# Patient Record
Sex: Female | Born: 1965
Health system: Southern US, Community
[De-identification: ages and names within clinical notes are randomized; demographics above are authoritative.]

## PROBLEM LIST (undated history)

## (undated) DIAGNOSIS — G4730 Sleep apnea, unspecified: Secondary | ICD-10-CM

## (undated) DIAGNOSIS — F32A Depression, unspecified: Secondary | ICD-10-CM

## (undated) DIAGNOSIS — J189 Pneumonia, unspecified organism: Secondary | ICD-10-CM

## (undated) DIAGNOSIS — T8859XA Other complications of anesthesia, initial encounter: Secondary | ICD-10-CM

## (undated) DIAGNOSIS — O2230 Deep phlebothrombosis in pregnancy, unspecified trimester: Secondary | ICD-10-CM

## (undated) DIAGNOSIS — I10 Essential (primary) hypertension: Secondary | ICD-10-CM

## (undated) DIAGNOSIS — R112 Nausea with vomiting, unspecified: Secondary | ICD-10-CM

## (undated) DIAGNOSIS — Z8719 Personal history of other diseases of the digestive system: Secondary | ICD-10-CM

## (undated) DIAGNOSIS — T7840XA Allergy, unspecified, initial encounter: Secondary | ICD-10-CM

## (undated) DIAGNOSIS — K219 Gastro-esophageal reflux disease without esophagitis: Secondary | ICD-10-CM

## (undated) DIAGNOSIS — F329 Major depressive disorder, single episode, unspecified: Secondary | ICD-10-CM

## (undated) DIAGNOSIS — F419 Anxiety disorder, unspecified: Secondary | ICD-10-CM

## (undated) DIAGNOSIS — E039 Hypothyroidism, unspecified: Secondary | ICD-10-CM

## (undated) DIAGNOSIS — D649 Anemia, unspecified: Secondary | ICD-10-CM

## (undated) DIAGNOSIS — R519 Headache, unspecified: Secondary | ICD-10-CM

## (undated) DIAGNOSIS — Z9889 Other specified postprocedural states: Secondary | ICD-10-CM

## (undated) DIAGNOSIS — T4145XA Adverse effect of unspecified anesthetic, initial encounter: Secondary | ICD-10-CM

## (undated) DIAGNOSIS — M1990 Unspecified osteoarthritis, unspecified site: Secondary | ICD-10-CM

## (undated) DIAGNOSIS — I2699 Other pulmonary embolism without acute cor pulmonale: Secondary | ICD-10-CM

## (undated) HISTORY — DX: Allergy, unspecified, initial encounter: T78.40XA

## (undated) HISTORY — PX: TOTAL ABDOMINAL HYSTERECTOMY W/ BILATERAL SALPINGOOPHORECTOMY: SHX83

## (undated) HISTORY — PX: OTHER SURGICAL HISTORY: SHX169

## (undated) HISTORY — PX: ABDOMINAL HYSTERECTOMY: SHX81

## (undated) HISTORY — PX: WISDOM TOOTH EXTRACTION: SHX21

## (undated) HISTORY — PX: COLONOSCOPY: SHX174

## (undated) HISTORY — PX: TONSILLECTOMY: SUR1361

---
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
## 2006-09-16 ENCOUNTER — Ambulatory Visit: Payer: Self-pay | Admitting: Family Medicine

## 2006-10-12 ENCOUNTER — Ambulatory Visit: Payer: Self-pay | Admitting: Gastroenterology

## 2011-06-23 DIAGNOSIS — M5136 Other intervertebral disc degeneration, lumbar region: Secondary | ICD-10-CM | POA: Insufficient documentation

## 2011-06-23 DIAGNOSIS — M47816 Spondylosis without myelopathy or radiculopathy, lumbar region: Secondary | ICD-10-CM | POA: Insufficient documentation

## 2011-07-01 DIAGNOSIS — D6869 Other thrombophilia: Secondary | ICD-10-CM | POA: Insufficient documentation

## 2011-07-01 DIAGNOSIS — F329 Major depressive disorder, single episode, unspecified: Secondary | ICD-10-CM | POA: Insufficient documentation

## 2011-07-01 DIAGNOSIS — G4700 Insomnia, unspecified: Secondary | ICD-10-CM | POA: Insufficient documentation

## 2011-07-01 DIAGNOSIS — K5790 Diverticulosis of intestine, part unspecified, without perforation or abscess without bleeding: Secondary | ICD-10-CM | POA: Insufficient documentation

## 2011-07-01 DIAGNOSIS — D699 Hemorrhagic condition, unspecified: Secondary | ICD-10-CM | POA: Insufficient documentation

## 2011-07-01 DIAGNOSIS — D509 Iron deficiency anemia, unspecified: Secondary | ICD-10-CM | POA: Insufficient documentation

## 2011-08-21 DIAGNOSIS — G894 Chronic pain syndrome: Secondary | ICD-10-CM | POA: Insufficient documentation

## 2011-08-21 DIAGNOSIS — G8929 Other chronic pain: Secondary | ICD-10-CM | POA: Insufficient documentation

## 2012-08-03 DIAGNOSIS — Z803 Family history of malignant neoplasm of breast: Secondary | ICD-10-CM | POA: Insufficient documentation

## 2012-08-17 DIAGNOSIS — I8290 Acute embolism and thrombosis of unspecified vein: Secondary | ICD-10-CM | POA: Insufficient documentation

## 2012-08-17 DIAGNOSIS — Z86718 Personal history of other venous thrombosis and embolism: Secondary | ICD-10-CM | POA: Insufficient documentation

## 2013-02-01 DIAGNOSIS — M7750 Other enthesopathy of unspecified foot: Secondary | ICD-10-CM | POA: Insufficient documentation

## 2013-02-01 DIAGNOSIS — M722 Plantar fascial fibromatosis: Secondary | ICD-10-CM | POA: Insufficient documentation

## 2013-02-01 DIAGNOSIS — M79673 Pain in unspecified foot: Secondary | ICD-10-CM | POA: Insufficient documentation

## 2013-06-29 DIAGNOSIS — M5416 Radiculopathy, lumbar region: Secondary | ICD-10-CM | POA: Insufficient documentation

## 2013-10-25 DIAGNOSIS — K649 Unspecified hemorrhoids: Secondary | ICD-10-CM | POA: Insufficient documentation

## 2013-10-25 DIAGNOSIS — K635 Polyp of colon: Secondary | ICD-10-CM | POA: Insufficient documentation

## 2013-11-18 DIAGNOSIS — G8929 Other chronic pain: Secondary | ICD-10-CM | POA: Insufficient documentation

## 2015-04-04 DIAGNOSIS — F4011 Social phobia, generalized: Secondary | ICD-10-CM | POA: Insufficient documentation

## 2015-04-04 DIAGNOSIS — Z62819 Personal history of unspecified abuse in childhood: Secondary | ICD-10-CM | POA: Insufficient documentation

## 2016-01-01 DIAGNOSIS — G43019 Migraine without aura, intractable, without status migrainosus: Secondary | ICD-10-CM | POA: Insufficient documentation

## 2016-07-22 DIAGNOSIS — H8110 Benign paroxysmal vertigo, unspecified ear: Secondary | ICD-10-CM | POA: Insufficient documentation

## 2019-02-02 ENCOUNTER — Encounter: Payer: Self-pay | Admitting: Emergency Medicine

## 2019-02-02 ENCOUNTER — Other Ambulatory Visit: Payer: Self-pay

## 2019-02-02 ENCOUNTER — Inpatient Hospital Stay
Admission: EM | Admit: 2019-02-02 | Discharge: 2019-02-03 | DRG: 176 | Disposition: A | Discharge status: Home / Self Care | Payer: Self-pay | Attending: Internal Medicine | Admitting: Internal Medicine

## 2019-02-02 ENCOUNTER — Emergency Department: Payer: Self-pay

## 2019-02-02 ENCOUNTER — Other Ambulatory Visit
Admission: RE | Admit: 2019-02-02 | Discharge: 2019-02-02 | Disposition: A | Discharge status: Home / Self Care | Payer: Self-pay | Source: Ambulatory Visit | Attending: Internal Medicine | Admitting: Internal Medicine

## 2019-02-02 DIAGNOSIS — Z86718 Personal history of other venous thrombosis and embolism: Secondary | ICD-10-CM

## 2019-02-02 DIAGNOSIS — M549 Dorsalgia, unspecified: Secondary | ICD-10-CM | POA: Diagnosis present

## 2019-02-02 DIAGNOSIS — Z885 Allergy status to narcotic agent status: Secondary | ICD-10-CM

## 2019-02-02 DIAGNOSIS — R0602 Shortness of breath: Secondary | ICD-10-CM | POA: Insufficient documentation

## 2019-02-02 DIAGNOSIS — F329 Major depressive disorder, single episode, unspecified: Secondary | ICD-10-CM | POA: Diagnosis present

## 2019-02-02 DIAGNOSIS — G8929 Other chronic pain: Secondary | ICD-10-CM | POA: Diagnosis present

## 2019-02-02 DIAGNOSIS — M7989 Other specified soft tissue disorders: Secondary | ICD-10-CM | POA: Diagnosis present

## 2019-02-02 DIAGNOSIS — Z79899 Other long term (current) drug therapy: Secondary | ICD-10-CM

## 2019-02-02 DIAGNOSIS — E876 Hypokalemia: Secondary | ICD-10-CM | POA: Diagnosis present

## 2019-02-02 DIAGNOSIS — Z79891 Long term (current) use of opiate analgesic: Secondary | ICD-10-CM

## 2019-02-02 DIAGNOSIS — Z66 Do not resuscitate: Secondary | ICD-10-CM | POA: Diagnosis present

## 2019-02-02 DIAGNOSIS — Z888 Allergy status to other drugs, medicaments and biological substances status: Secondary | ICD-10-CM

## 2019-02-02 DIAGNOSIS — R7989 Other specified abnormal findings of blood chemistry: Secondary | ICD-10-CM

## 2019-02-02 DIAGNOSIS — I2699 Other pulmonary embolism without acute cor pulmonale: Principal | ICD-10-CM | POA: Diagnosis present

## 2019-02-02 DIAGNOSIS — Z86711 Personal history of pulmonary embolism: Secondary | ICD-10-CM | POA: Diagnosis present

## 2019-02-02 DIAGNOSIS — Z20828 Contact with and (suspected) exposure to other viral communicable diseases: Secondary | ICD-10-CM | POA: Diagnosis present

## 2019-02-02 DIAGNOSIS — Z882 Allergy status to sulfonamides status: Secondary | ICD-10-CM

## 2019-02-02 DIAGNOSIS — Z87891 Personal history of nicotine dependence: Secondary | ICD-10-CM

## 2019-02-02 HISTORY — DX: Deep phlebothrombosis in pregnancy, unspecified trimester: O22.30

## 2019-02-02 LAB — CBC WITH DIFFERENTIAL/PLATELET
Abs Immature Granulocytes: 0.02 10*3/uL (ref 0.00–0.07)
Basophils Absolute: 0 10*3/uL (ref 0.0–0.1)
Basophils Relative: 0 %
Eosinophils Absolute: 0.2 10*3/uL (ref 0.0–0.5)
Eosinophils Relative: 2 %
HCT: 43 % (ref 36.0–46.0)
Hemoglobin: 14.7 g/dL (ref 12.0–15.0)
Immature Granulocytes: 0 %
Lymphocytes Relative: 19 %
Lymphs Abs: 1.3 10*3/uL (ref 0.7–4.0)
MCH: 27.8 pg (ref 26.0–34.0)
MCHC: 34.2 g/dL (ref 30.0–36.0)
MCV: 81.4 fL (ref 80.0–100.0)
Monocytes Absolute: 0.3 10*3/uL (ref 0.1–1.0)
Monocytes Relative: 4 %
Neutro Abs: 5 10*3/uL (ref 1.7–7.7)
Neutrophils Relative %: 75 %
Platelets: 321 10*3/uL (ref 150–400)
RBC: 5.28 MIL/uL — ABNORMAL HIGH (ref 3.87–5.11)
RDW: 12.4 % (ref 11.5–15.5)
WBC: 6.8 10*3/uL (ref 4.0–10.5)
nRBC: 0 % (ref 0.0–0.2)

## 2019-02-02 LAB — BASIC METABOLIC PANEL
Anion gap: 11 (ref 5–15)
BUN: 15 mg/dL (ref 6–20)
CO2: 26 mmol/L (ref 22–32)
Calcium: 10.1 mg/dL (ref 8.9–10.3)
Chloride: 104 mmol/L (ref 98–111)
Creatinine, Ser: 0.89 mg/dL (ref 0.44–1.00)
GFR calc Af Amer: >60 mL/min (ref >60)
GFR calc non Af Amer: >60 mL/min (ref >60)
Glucose, Bld: 103 mg/dL — ABNORMAL HIGH (ref 70–99)
Potassium: 3.3 mmol/L — ABNORMAL LOW (ref 3.5–5.1)
Sodium: 141 mmol/L (ref 135–145)

## 2019-02-02 LAB — TYPE AND SCREEN
ABO/RH(D): B POS
Antibody Screen: NEGATIVE

## 2019-02-02 LAB — PROTIME-INR
INR: 1 (ref 0.8–1.2)
Prothrombin Time: 13.5 seconds (ref 11.4–15.2)

## 2019-02-02 LAB — FIBRIN DERIVATIVES D-DIMER (ARMC ONLY): Fibrin derivatives D-dimer (ARMC): 2836.33 ng/mL (FEU) — ABNORMAL HIGH (ref 0.00–499.00)

## 2019-02-02 LAB — APTT: aPTT: 28 seconds (ref 24–36)

## 2019-02-02 LAB — TROPONIN I (HIGH SENSITIVITY): Troponin I (High Sensitivity): 3 ng/L (ref <18)

## 2019-02-02 LAB — HEPARIN LEVEL (UNFRACTIONATED): Heparin Unfractionated: 0.4 IU/mL (ref 0.30–0.70)

## 2019-02-02 MED ORDER — HEPARIN (PORCINE) 25000 UT/250ML-% IV SOLN
1100.0000 [IU]/h | INTRAVENOUS | Status: AC
  Administered 2019-02-02: 17:00:00 1100 [IU]/h via INTRAVENOUS
  Filled 2019-02-02: qty 250

## 2019-02-02 MED ORDER — TIZANIDINE HCL 4 MG PO TABS
4.0000 mg | ORAL_TABLET | Freq: Three times a day (TID) | ORAL | Status: DC | PRN
  Filled 2019-02-02: qty 1

## 2019-02-02 MED ORDER — POTASSIUM CHLORIDE CRYS ER 20 MEQ PO TBCR
40.0000 meq | EXTENDED_RELEASE_TABLET | Freq: Once | ORAL | Status: AC
  Administered 2019-02-02: 40 meq via ORAL
  Filled 2019-02-02: qty 2

## 2019-02-02 MED ORDER — OXYCODONE HCL 5 MG PO TABS
30.0000 mg | ORAL_TABLET | Freq: Every day | ORAL | Status: DC
  Administered 2019-02-02 – 2019-02-03 (×5): 30 mg via ORAL
  Filled 2019-02-02 (×5): qty 6

## 2019-02-02 MED ORDER — MIRTAZAPINE 15 MG PO TABS
15.0000 mg | ORAL_TABLET | Freq: Every day | ORAL | Status: DC
  Administered 2019-02-02: 15 mg via ORAL

## 2019-02-02 MED ORDER — SODIUM CHLORIDE 0.9 % IV SOLN
INTRAVENOUS | Status: DC
  Administered 2019-02-02: 19:00:00 via INTRAVENOUS

## 2019-02-02 MED ORDER — FLUTICASONE PROPIONATE 50 MCG/ACT NA SUSP
1.0000 | Freq: Every day | NASAL | Status: DC
  Filled 2019-02-02: qty 16

## 2019-02-02 MED ORDER — HEPARIN BOLUS VIA INFUSION
4000.0000 [IU] | Freq: Once | INTRAVENOUS | Status: AC
  Administered 2019-02-02: 17:00:00 4000 [IU] via INTRAVENOUS

## 2019-02-02 MED ORDER — LABETALOL HCL 5 MG/ML IV SOLN
10.0000 mg | INTRAVENOUS | Status: DC | PRN
  Administered 2019-02-02 – 2019-02-03 (×2): 10 mg via INTRAVENOUS
  Filled 2019-02-02: qty 4

## 2019-02-02 MED ORDER — IOHEXOL 350 MG/ML SOLN
75.0000 mL | Freq: Once | INTRAVENOUS | Status: AC | PRN
  Administered 2019-02-02: 75 mL via INTRAVENOUS

## 2019-02-02 MED ORDER — DIPHENHYDRAMINE HCL 25 MG PO CAPS: 75.0000 mg | ORAL_CAPSULE | Freq: Every evening | ORAL | Status: DC | PRN

## 2019-02-02 MED ORDER — VENLAFAXINE HCL ER 150 MG PO CP24
300.0000 mg | ORAL_CAPSULE | Freq: Every day | ORAL | Status: DC
  Administered 2019-02-03: 300 mg via ORAL
  Filled 2019-02-02: qty 2
  Filled 2019-02-02: qty 4

## 2019-02-02 MED ORDER — ZOLPIDEM TARTRATE 5 MG PO TABS
5.0000 mg | ORAL_TABLET | Freq: Every evening | ORAL | Status: DC | PRN
  Administered 2019-02-02: 5 mg via ORAL
  Filled 2019-02-02: qty 1

## 2019-02-02 MED ORDER — LABETALOL HCL 5 MG/ML IV SOLN
INTRAVENOUS | Status: AC
  Filled 2019-02-02: qty 4

## 2019-02-02 MED ORDER — SODIUM CHLORIDE 0.9 % IV BOLUS
1000.0000 mL | Freq: Once | INTRAVENOUS | Status: AC
  Administered 2019-02-02: 1000 mL via INTRAVENOUS

## 2019-02-02 MED ORDER — HEPARIN (PORCINE) 25000 UT/250ML-% IV SOLN
INTRAVENOUS | Status: AC
  Administered 2019-02-02: 1100 [IU]/h via INTRAVENOUS
  Filled 2019-02-02: qty 250

## 2019-02-02 NOTE — ED Notes (Signed)
Patient transported to Ultrasound 

## 2019-02-02 NOTE — ED Provider Notes (Addendum)
[facility] Emergency Department Provider Note  ____________________________________________   First MD Initiated Contact with Patient 02/02/19 1406     (approximate)  I have reviewed the triage vital signs and the nursing notes.  History  Chief Complaint Shortness of Breath    HPI Tracey Perez is a 53 y.o. female with a history of anemia, DVT who presents from clinic for left leg swelling and shortness of breath.  Patient was initially seen in the clinic, and had labs performed which revealed an elevated d-dimer, was therefore referred to the emergency department for further evaluation.  Patient reports ongoing shortness of breath for approximately 1 week.  Her symptoms seem to have started without any inciting event.  She reports shortness of breath is worsened with exertion.  She has some associated chest pain on deep inspiration.  Aside from this, she does not have any other chest pain.  No fevers or cough.  She also reports ongoing, intermittent issues with left lower extremity swelling.  This has been going on for 1 to 2 months.  She has been wearing compression stockings for this and states it has improved.  No recent travel.  No hormonal medication use.  On arrival to the emergency department she states she is not experiencing any of the swelling currently.  She reports a remote history of DVT in the setting of OCP use.  After treatment of such she has not been on any anticoagulation.   Past Medical Hx DVT  Problem List History of DVT   Past Surgical Hx Hysterectomy  Medications Prior to Admission medications   Not on File    Allergies Morphine and related, Sulfa antibiotics, and Acetazolamide  Family Hx No family history on file.  Social Hx Social History   Tobacco Use  . Smoking status: Not on file  Substance Use Topics  . Alcohol use: Not on file  . Drug use: Not on file     Review of Systems  Constitutional:  Negative for fever, chills. Eyes: Negative for visual changes. ENT: Negative for sore throat. Cardiovascular: Negative for chest pain. Respiratory: + for shortness of breath. Gastrointestinal: Negative for nausea, vomiting.  Genitourinary: Negative for dysuria. Musculoskeletal: + for leg swelling. Skin: Negative for rash. Neurological: Negative for for headaches.   Physical Exam  Vital Signs: ED Triage Vitals  Enc Vitals Group     BP 02/02/19 1357 (!) 189/98     Pulse Rate 02/02/19 1357 94     Resp 02/02/19 1357 20     Temp 02/02/19 1357 98.4 F (36.9 C)     Temp Source 02/02/19 1357 Oral     SpO2 02/02/19 1357 97 %     Weight 02/02/19 1402 166 lb (75.3 kg)     Height 02/02/19 1402 5\' 3"  (1.6 m)     Head Circumference --      Peak Flow --      Pain Score 02/02/19 1402 4     Pain Loc --      Pain Edu? --      Excl. in [place]? --     Constitutional: Alert and oriented.  Head: Normocephalic. Atraumatic. Eyes: Conjunctivae clear. Sclera anicteric. Nose: No congestion. No rhinorrhea. Mouth/Throat: Mucous membranes are moist.  Neck: No stridor.   Cardiovascular: Normal rate, regular rhythm. Extremities well perfused. Respiratory: Normal respiratory effort.  Lungs CTAB.  Speaking in full sentences.  Oxygen 97% on room air. Gastrointestinal: Soft. Non-tender. Non-distended.  Musculoskeletal: No lower  extremity edema.  No lower extremity swelling or asymmetry.  No calf tenderness.  No deformities. Neurologic:  Normal speech and language. No gross focal neurologic deficits are appreciated.  Bilateral lower extremity strength 5/5 and symmetric. Skin: Skin is warm, dry and intact. No rash noted. Psychiatric: Mood and affect are appropriate for situation.  EKG  Personally reviewed.   Rate: 94 Rhythm: sinus Axis: normal Intervals: WNL No acute ischemic changes S1Q3T3 No STEMI    Radiology  [place]: IMPRESSION: Negative for deep venous thrombosis in left lower extremity.   CT: IMPRESSION:  1. Positive for bilateral pulmonary emboli. Clot burden is moderate  in size. No evidence for right heart strain.  2. Poorly defined pleural-based densities in the right middle lobe  probably represent small infarcts. Additional small areas of  ground-glass attenuation could represent infarct or atelectasis.  3. 4 mm pleural-based nodule in the right upper lobe. No follow-up  needed if patient is low-risk. Non-contrast chest CT can be  considered in 12 months if patient is high-risk. This recommendation  follows the consensus statement: Guidelines for Management of  Incidental Pulmonary Nodules Detected on CT Images: From the  Fleischner Society 2017; Radiology 2017; 284:228-243.  4. Hiatal hernia.    Procedures  Procedure(s) performed (including critical care):  .Critical Care Performed by: Lilia Pro., MD Authorized by: Lilia Pro., MD   Critical care provider statement:    Critical care time (minutes):  30   Critical care was necessary to treat or prevent imminent or life-threatening deterioration of the following conditions: PE treated with heparin gtt.   Critical care was time spent personally by me on the following activities:  Discussions with consultants, evaluation of patient's response to treatment, examination of patient, ordering and performing treatments and interventions, ordering and review of laboratory studies, ordering and review of radiographic studies, pulse oximetry, re-evaluation of patient's condition, obtaining history from patient or surrogate and review of old charts     Initial Impression / Assessment and Plan / ED Course  53 y.o. female who presents to the ED for 1 week of shortness of breath, and intermittent left lower extremity swelling x 1 month, none currently.  History of a DVT many years ago, not currently on any anticoagulation.  Seen at clinic with an elevated d-dimer and referred to the emergency department.  Ddx:  DVT, PE, positional swelling, amongst others  Plan: labs, CT, [place]  Ultrasound negative for DVT.  CT scan is positive for bilateral pulmonary emboli, moderate clot burden and concern for small areas of related infarct.  As such, will initiate a heparin drip and plan for admission.  She is hemodynamically stable, not in any respiratory distress and not requiring any supplemental oxygen.  Her troponin is negative.  Discussed with hospitalist for admission.   Final Clinical Impression(s) / ED Diagnosis  Final diagnoses:  Elevated d-dimer  Shortness of breath  Pulmonary embolism     Note:  This document was prepared using Dragon voice recognition software and may include unintentional dictation errors.     Lilia Pro., MD 02/02/19 2020

## 2019-02-02 NOTE — ED Notes (Signed)
Spoke with floor. Receiving RN states there is no bed in the room and they are waiting on bed and will call when it arrives.

## 2019-02-02 NOTE — ED Notes (Signed)
Pt states she is allergic to plastic that IV bags are made of. States she can still receive IVF, but may have itching that is relieved by Benadryl. EDP notified, ok to run IVF at this time.

## 2019-02-02 NOTE — ED Triage Notes (Signed)
Pt here with c/o shob that began last week, nothing seemed to make it better or worse, just consistently feeling like she can't catch her breath. Swelling in her left lower extremity for about a month now, hx of dvt in 2002. D-dimer today in office resulted at 2,836.33. Able to talk in complete sentences, no distress at this time. Texting and laughing in triage.

## 2019-02-02 NOTE — Progress Notes (Signed)
   02/02/19 2000  Clinical Encounter Type  Visited With Patient and family together  Visit Type Initial  Referral From Nurse  Ch went in for AD education. Pt already had an electronic copy of the notarized version from 2018. Ch requested the daughter to bring a printed copy so that it can be placed on file.

## 2019-02-02 NOTE — Consult Note (Signed)
ANTICOAGULATION CONSULT NOTE - Follow Up Consult  Pharmacy Consult for Heparin Indication: pulmonary embolus  Allergies  Allergen Reactions  . Morphine And Related Hives  . Nsaids Other (See Comments)    Affects platelet count  . Sulfa Antibiotics Shortness Of Breath  . Tape Other (See Comments)    bruises skin  . Verapamil Swelling  . Other Itching    Pt states she is allergic to the plastic that IV bags are made of. Reaction causes itching relieved by Benadryl.   . Acetazolamide Other (See Comments)    Acid reflux   . Seroquel [Quetiapine Fumarate] Other (See Comments)    Muscle cramps    Patient Measurements: Height: 5\' 3"  (160 cm) Weight: 166 lb (75.3 kg) IBW/kg (Calculated) : 52.4 Heparin Dosing Weight: 68.4  Vital Signs: Temp: 98.4 F (36.9 C) (10/14 1357) Temp Source: Oral (10/14 1357) BP: 157/94 (10/14 1508) Pulse Rate: 104 (10/14 1511)  Labs: Recent Labs    02/02/19 1417  HGB 14.7  HCT 43.0  PLT 321  CREATININE 0.89  TROPONINIHS 3    Estimated Creatinine Clearance: 70.3 mL/min (by C-G formula based on SCr of 0.89 mg/dL).   Medications:  No anticoagulation per medication reconciliation  Assessment: 53 y/o F with history of anemia and DVT presents from clinic for left leg swelling and SOB. Labs significant for elevated D-dimer. CTA significant for bilateral PE with moderate clot burden and no evidence of right heart strain. Pharmacy consulted to start heparin.   Goal of Therapy:  Heparin level 0.3-0.7 units/ml Monitor platelets by anticoagulation protocol: Yes   Plan:  -Heparin 4000 unit bolus IV x 1 followed by a continuous infusion at 1100 units/hr -Heparin level at 2300 -Daily heparin level and CBC per protocol  [place] Resident 02/02/2019,3:53 PM

## 2019-02-02 NOTE — H&P (Signed)
[place] [facility] [place] NAME: Tracey Perez    MR#:  [id number]  DATE OF BIRTH:  Jun 27, 1965  DATE OF ADMISSION:  02/02/2019  PRIMARY CARE PHYSICIAN: Patient, No Pcp Per   REQUESTING/REFERRING PHYSICIAN: Derrell Lolling  CHIEF COMPLAINT:   Chief Complaint  Patient presents with   Shortness of Breath    HISTORY OF PRESENT ILLNESS:  Tracey Perez  is a 53 y.o. female with a known history of DVT in the setting of being on OCP, previously treated with anticoagulation with Coumadin for about 1 year status post, migraine headaches, depression and chronic back pain on narcotics who presented to the emergency room from the clinic with complaints of left leg swelling and some shortness of breath.  Also reported some chest pain which was pleuritic in nature.  No fevers.  No nausea vomiting.  Patient has had chronic left lower extremity swelling since prior DVT and uses compression stockings because she has to stand for about 10 hours a day while walking.  Patient was evaluated in the emergency room.  Troponin negative at 3.  Left lower extremity venous Doppler ultrasound negative for DVT.  CT angiogram of the chest done was positive for bilateral pulmonary emboli. Clot burden is moderate in size. No evidence for right heart strain. Poorly defined pleural-based densities in the right middle lobe probably represent small infarcts. Additional small areas of ground-glass attenuation could represent infarct or atelectasis. 3. 4 mm pleural-based nodule in the right upper lobe.  Emergency room provider requested for pharmacist to assist with initiation of heparin drip for treatment of bilateral PE.  Oxygen saturation of 97% on room air.  Medical service called to admit patient for further evaluation and management.  PAST MEDICAL HISTORY:   Past Medical History:  Diagnosis Date   DVT (deep vein thrombosis) in pregnancy     PAST SURGICAL HISTORY:   Past Surgical  History:  Procedure Laterality Date   ABDOMINAL HYSTERECTOMY     TONSILLECTOMY      SOCIAL HISTORY:   Social History   Tobacco Use   Smoking status: Former Smoker   Smokeless tobacco: Never Used  Substance Use Topics   Alcohol use: Never    Frequency: Never  Patient quit smoking in 2013.  Denies any history of alcohol or IV drug use.  FAMILY HISTORY:  No family history of coronary artery disease or diabetes mellitus.  DRUG ALLERGIES:   Allergies  Allergen Reactions   Morphine And Related Hives   Nsaids Other (See Comments)    Affects platelet count   Sulfa Antibiotics Shortness Of Breath   Tape Other (See Comments)    bruises skin   Verapamil Swelling   Other Itching    Pt states she is allergic to the plastic that IV bags are made of. Reaction causes itching relieved by Benadryl.    Acetazolamide Other (See Comments)    Acid reflux    Seroquel [Quetiapine Fumarate] Other (See Comments)    Muscle cramps    REVIEW OF SYSTEMS:   Review of Systems  Constitutional: Negative for chills and fever.  HENT: Negative for hearing loss and tinnitus.   Eyes: Negative for blurred vision and double vision.  Respiratory: Positive for shortness of breath. Negative for cough.   Cardiovascular: Positive for leg swelling. Negative for palpitations.       Chest pain when she takes a deep breath.  Gastrointestinal: Negative for abdominal pain, heartburn, nausea and vomiting.  Genitourinary: Negative for dysuria and urgency.  Musculoskeletal: Negative for myalgias and neck pain.  Skin: Negative for itching and rash.  Neurological: Negative for dizziness and headaches.  Psychiatric/Behavioral: Negative for depression and hallucinations.    MEDICATIONS AT HOME:   Prior to Admission medications   Medication Sig Start Date End Date Taking? Authorizing Provider  diphenhydrAMINE (BENADRYL) 25 mg capsule Take 75 mg by mouth at bedtime as needed for allergies or sleep.    Yes [provider]  fluticasone (FLONASE) 50 MCG/ACT nasal spray Place 1 spray into both nostrils daily.   Yes [provider]  mirtazapine (REMERON) 30 MG tablet Take 15 mg by mouth daily.   Yes [provider]  oxycodone (ROXICODONE) 30 MG immediate release tablet Take 30 mg by mouth 5 (five) times daily.   Yes [provider]  tiZANidine (ZANAFLEX) 4 MG tablet Take 4 mg by mouth 3 (three) times daily as needed for muscle spasms.   Yes [provider]  venlafaxine XR (EFFEXOR-XR) 150 MG 24 hr capsule Take 300 mg by mouth daily.   Yes [provider]  zolpidem (AMBIEN) 10 MG tablet Take 10 mg by mouth at bedtime as needed for sleep.   Yes [provider]      VITAL SIGNS:  Blood pressure (!) 182/100, pulse 93, temperature 98.4 F (36.9 C), temperature source Oral, resp. rate 20, height 5\' 3"  (1.6 m), weight 75.3 kg, SpO2 97 %.  PHYSICAL EXAMINATION:  Physical Exam  GENERAL:  53 y.o.-year-old patient lying in the bed with no acute distress.  EYES: Pupils equal, round, reactive to light and accommodation. No scleral icterus. Extraocular muscles intact.  HEENT: Head atraumatic, normocephalic. Oropharynx and nasopharynx clear.  NECK:  Supple, no jugular venous distention. No thyroid enlargement, no tenderness.  LUNGS: Normal breath sounds bilaterally, no wheezing, rales,rhonchi or crepitation. No use of accessory muscles of respiration.  CARDIOVASCULAR: S1, S2 normal. No murmurs, rubs, or gallops.  ABDOMEN: Soft, nontender, nondistended. Bowel sounds present. No organomegaly or mass.  EXTREMITIES: No pedal edema, cyanosis, or clubbing.  NEUROLOGIC: Cranial nerves II through XII are intact. Muscle strength 5/5 in all extremities. Sensation intact. Gait not checked.  PSYCHIATRIC: The patient is alert and oriented x 3.  SKIN: No obvious rash, lesion, or ulcer.   LABORATORY PANEL:   CBC Recent Labs  Lab 02/02/19 1417  WBC 6.8   HGB 14.7  HCT 43.0  PLT 321   ------------------------------------------------------------------------------------------------------------------  Chemistries  Recent Labs  Lab 02/02/19 1417  NA 141  K 3.3*  CL 104  CO2 26  GLUCOSE 103*  BUN 15  CREATININE 0.89  CALCIUM 10.1   ------------------------------------------------------------------------------------------------------------------  Cardiac Enzymes No results for input(s): TROPONINI in the last 168 hours. ------------------------------------------------------------------------------------------------------------------  RADIOLOGY:  Ct Angio Chest Pe W/cm &/or Wo Cm  Addendum Date: 02/02/2019   ADDENDUM REPORT: 02/02/2019 16:02 ADDENDUM: 1.6 cm right thyroid nodule. Recommend further characterization with a thyroid ultrasound. Electronically Signed   By: Markus Daft M.D.   On: 02/02/2019 16:02   Result Date: 02/02/2019 CLINICAL DATA:  53 year old with shortness of breath and elevated D-dimer. EXAM: CT ANGIOGRAPHY CHEST WITH CONTRAST TECHNIQUE: Multidetector CT imaging of the chest was performed using the standard protocol during bolus administration of intravenous contrast. Multiplanar CT image reconstructions and MIPs were obtained to evaluate the vascular anatomy. CONTRAST:  56mL OMNIPAQUE IOHEXOL 350 MG/ML SOLN COMPARISON:  None. FINDINGS: Cardiovascular: Positive for bilateral pulmonary emboli. Scattered pulmonary emboli in the bilateral  pulmonary arteries. Thrombus is predominantly in the lobar and segmental branches. Largest clot burden involves segmental branches in the right middle lobe and right lower lobe. Segmental pulmonary emboli in the left lower lobe. Small amount of clot burden in the upper lobes. Heart size is normal. No significant pericardial effusion. No evidence for right heart strain. Mediastinum/Nodes: Moderate sized hiatal hernia. Right thyroid nodule measuring up to 1.6 cm on sequence 4, image 13. No  significant mediastinal or hilar lymph node enlargement. No axillary lymph node enlargement. Lungs/Pleura: Trachea and mainstem bronchi are patent. Irregular peripheral densities in the right middle lobe could represent small pulmonary infarcts. 4 mm pleural-based nodule in the posterior right upper lobe on sequence 6 image 23. Small focus of ground-glass attenuation in the medial lingula on sequence 6, image 57 could represent atelectasis but nonspecific. Subtle peripheral ground-glass attenuation in the right lower lobe may represent atelectasis or mild infarct. No significant airspace disease or consolidation. No pleural effusions. Upper Abdomen: Images of the upper abdomen are unremarkable. Musculoskeletal: No acute bone abnormality. Review of the MIP images confirms the above findings. IMPRESSION: 1. Positive for bilateral pulmonary emboli. Clot burden is moderate in size. No evidence for right heart strain. 2. Poorly defined pleural-based densities in the right middle lobe probably represent small infarcts. Additional small areas of ground-glass attenuation could represent infarct or atelectasis. 3. 4 mm pleural-based nodule in the right upper lobe. No follow-up needed if patient is low-risk. Non-contrast chest CT can be considered in 12 months if patient is high-risk. This recommendation follows the consensus statement: Guidelines for Management of Incidental Pulmonary Nodules Detected on CT Images: From the Fleischner Society 2017; Radiology 2017; 284:228-243. 4. Hiatal hernia. Critical Value/emergent results were called by telephone at the time of interpretation on 02/02/2019 at 3:38 pm to [facility] , who verbally acknowledged these results. Electronically Signed: By: Markus Daft M.D. On: 02/02/2019 15:42   US Venous Img Lower  Left (dvt Study)  Result Date: 02/02/2019 CLINICAL DATA:  Left lower extremity edema. Elevated D-dimer. Pulmonary embolism on chest CTA. EXAM: LEFT LOWER EXTREMITY  VENOUS DOPPLER ULTRASOUND TECHNIQUE: Gray-scale sonography with graded compression, as well as color Doppler and duplex ultrasound were performed to evaluate the lower extremity deep venous systems from the level of the common femoral vein and including the common femoral, femoral, profunda femoral, popliteal and calf veins including the posterior tibial, peroneal and gastrocnemius veins when visible. The superficial great saphenous vein was also interrogated. Spectral Doppler was utilized to evaluate flow at rest and with distal augmentation maneuvers in the common femoral, femoral and popliteal veins. COMPARISON:  Chest CTA 02/02/2019 FINDINGS: Contralateral Common Femoral Vein: Reportedly, the patient did not tolerate complete compression of the right common femoral vein. Right common femoral vein appears to be patent on color Doppler images without echogenic thrombus. Common Femoral Vein: No evidence of thrombus. Normal compressibility, respiratory phasicity and response to augmentation. Saphenofemoral Junction: No evidence of thrombus. Normal compressibility and flow on color Doppler imaging. Profunda Femoral Vein: No evidence of thrombus. Normal compressibility and flow on color Doppler imaging. Femoral Vein: No evidence of thrombus. Normal compressibility, respiratory phasicity and response to augmentation. Popliteal Vein: No evidence of thrombus. Normal compressibility, respiratory phasicity and response to augmentation. Calf Veins: Visualized left deep calf veins are patent without thrombus. Other Findings:  None. IMPRESSION: Negative for deep venous thrombosis in left lower extremity. Electronically Signed   By: Markus Daft M.D.   On: 02/02/2019 15:20  IMPRESSION AND PLAN:  Patient is a 53 year old female with a known history of remote DVT in the setting of being on OCP, previously treated with anticoagulation with Coumadin for about 1 year status post, migraine headaches, depression and chronic  back pain on narcotics who is being admitted for management of bilateral pulmonary embolism  1.  Bilateral pulmonary embolism Patient presented with shortness of breath, pleuritic chest pain and left lower extremity swelling. Moderate clot burden but no evidence of right heart strain on CT scan.  No evidence of DVT on venous Doppler ultrasound. Pharmacist already consulted for initiation of heparin drip.  Requested for 2D echocardiogram to evaluate cardiac function Anticipated transitioning to Eliquis in a.m. if patient remains hemodynamically stable. Oxygen saturation of 97% on room air.  2.  Hypokalemia Replaced.  Follow-up for repeat levels in a.m.  3.  Chronic back pain Stable.  Resume home dose of oxycodone  4.  Depression Resumed home dose of Effexor.  5.  Elevated blood pressure Patient denies prior diagnosis of hypertension. Patient having some headache which may be contributing.  Reports history of migraine headaches. Pain control. Placed on as needed IV hydralazine with parameters.  If blood pressure remains elevated will initiate antihypertensive scheduled basis.  DVT prophylaxis; patient being started on heparin drip   All the records are reviewed and case discussed with ED provider. Management plans discussed with the patient, and she is in agreement. Patient awake and alert on oriented unable to make her own medical decisions.  Patient clearly wishes to be DNR/DNI.  CODE STATUS: DNR/DNI  TOTAL TIME TAKING CARE OF THIS PATIENT: 62 minutes.    Osborne Serio M.D on 02/02/2019 at 4:15 PM  Between 7am to 6pm - Pager - [phone]  After 6pm go to [email or web address] - Proofreader  [facility] [place] [facility]  Office  [phone]  CC: Primary care physician; Patient, No Pcp Per   Note: This dictation was prepared with Dragon dictation along with smaller phrase technology. Any transcriptional errors that result from this process are unintentional.

## 2019-02-03 ENCOUNTER — Inpatient Hospital Stay
Admit: 2019-02-03 | Discharge: 2019-02-03 | Disposition: A | Discharge status: Home / Self Care | Payer: Self-pay | Attending: Internal Medicine | Admitting: Internal Medicine

## 2019-02-03 LAB — BASIC METABOLIC PANEL
Anion gap: 9 (ref 5–15)
BUN: 13 mg/dL (ref 6–20)
CO2: 24 mmol/L (ref 22–32)
Calcium: 8.9 mg/dL (ref 8.9–10.3)
Chloride: 110 mmol/L (ref 98–111)
Creatinine, Ser: 0.9 mg/dL (ref 0.44–1.00)
GFR calc Af Amer: >60 mL/min (ref >60)
GFR calc non Af Amer: >60 mL/min (ref >60)
Glucose, Bld: 115 mg/dL — ABNORMAL HIGH (ref 70–99)
Potassium: 3.7 mmol/L (ref 3.5–5.1)
Sodium: 143 mmol/L (ref 135–145)

## 2019-02-03 LAB — CBC
HCT: 34.6 % — ABNORMAL LOW (ref 36.0–46.0)
Hemoglobin: 11.6 g/dL — ABNORMAL LOW (ref 12.0–15.0)
MCH: 27.6 pg (ref 26.0–34.0)
MCHC: 33.5 g/dL (ref 30.0–36.0)
MCV: 82.4 fL (ref 80.0–100.0)
Platelets: 272 10*3/uL (ref 150–400)
RBC: 4.2 MIL/uL (ref 3.87–5.11)
RDW: 12.7 % (ref 11.5–15.5)
WBC: 5.7 10*3/uL (ref 4.0–10.5)
nRBC: 0 % (ref 0.0–0.2)

## 2019-02-03 LAB — ECHOCARDIOGRAM COMPLETE
Height: 63 in
Weight: 2662.4 oz

## 2019-02-03 LAB — SARS CORONAVIRUS 2 (TAT 6-24 HRS): SARS Coronavirus 2: NEGATIVE

## 2019-02-03 LAB — HEPARIN LEVEL (UNFRACTIONATED): Heparin Unfractionated: 0.44 IU/mL (ref 0.30–0.70)

## 2019-02-03 LAB — HIV ANTIBODY (ROUTINE TESTING W REFLEX): HIV Screen 4th Generation wRfx: NONREACTIVE

## 2019-02-03 LAB — MAGNESIUM: Magnesium: 2 mg/dL (ref 1.7–2.4)

## 2019-02-03 MED ORDER — APIXABAN 5 MG PO TABS
10.0000 mg | ORAL_TABLET | Freq: Two times a day (BID) | ORAL | Status: DC
  Administered 2019-02-03: 10 mg via ORAL
  Filled 2019-02-03: qty 2

## 2019-02-03 MED ORDER — APIXABAN 5 MG PO TABS: ORAL_TABLET | ORAL | 0 refills | Status: DC

## 2019-02-03 NOTE — TOC Transition Note (Signed)
Transition of Care [facility]) - CM/SW Discharge Note   Patient Details  Name: Tracey Perez MRN: [id number] Date of Birth: [birth year]  Transition of Care [facility]) CM/SW Contact:  Ross Ludwig, LCSW Phone Number: 02/03/2019, 12:00 PM   Clinical Narrative:    Patient is a 53 year old female who is alert and oriented x4.  Patient does not have any insurance, patient stated she just picked up refills on all of her medications.  Patient states she uses [employer] to pay for her meds.  Patient is on Eliquis which is a new medication, CSW provided free 30 day coupon for her.  Patient received an application for the [facility] and [facility], patient did not express any other needs or concerns.   Final next level of care: Home/Self Care Barriers to Discharge: Barriers Resolved   Patient Goals and CMS Choice Patient states their goals for this hospitalization and ongoing recovery are:: To return back home      Discharge Placement  Patient to discharge back home, Eliquis coupon has been provided.        Discharge Plan and Services                DME Arranged: N/A DME Agency: NA         HH Agency: NA     Representative spoke with at [place]: na  Social Determinants of Health (SDOH) Interventions     Readmission Risk Interventions No flowsheet data found.

## 2019-02-03 NOTE — Consult Note (Signed)
ANTICOAGULATION CONSULT NOTE - Follow Up Consult  Pharmacy Consult for Heparin Indication: pulmonary embolus  Allergies  Allergen Reactions  . Morphine And Related Hives  . Nsaids Other (See Comments)    Affects platelet count  . Sulfa Antibiotics Shortness Of Breath  . Tape Other (See Comments)    bruises skin  . Verapamil Swelling  . Other Itching    Pt states she is allergic to the plastic that IV bags are made of. Reaction causes itching relieved by Benadryl.   . Acetazolamide Other (See Comments)    Acid reflux   . Seroquel [Quetiapine Fumarate] Other (See Comments)    Muscle cramps    Patient Measurements: Height: 5\' 3"  (160 cm) Weight: 164 lb 8 oz (74.6 kg) IBW/kg (Calculated) : 52.4 Heparin Dosing Weight: 68.4  Vital Signs: Temp: 98.9 F (37.2 C) (10/14 2010) Temp Source: Oral (10/14 2010) BP: 170/114 (10/14 2010) Pulse Rate: 87 (10/14 2010)  Labs: Recent Labs    02/02/19 1417 02/02/19 1602 02/02/19 2301  HGB 14.7  --   --   HCT 43.0  --   --   PLT 321  --   --   APTT  --  28  --   LABPROT  --  13.5  --   INR  --  1.0  --   HEPARINUNFRC  --   --  0.40  CREATININE 0.89  --   --   TROPONINIHS 3  --   --     Estimated Creatinine Clearance: 69.9 mL/min (by C-G formula based on SCr of 0.89 mg/dL).   Medications:  No anticoagulation per medication reconciliation  Assessment: 53 y/o F with history of anemia and DVT presents from clinic for left leg swelling and SOB. Labs significant for elevated D-dimer. CTA significant for bilateral PE with moderate clot burden and no evidence of right heart strain. Pharmacy consulted to start heparin.   10/15 @ 2301 HL = 0.40, therapeutic x 1  Goal of Therapy:  Heparin level 0.3-0.7 units/ml Monitor platelets by anticoagulation protocol: Yes   Plan:  -Continue Heparin infusion at 1100 units/hr -Heparin level at 0700 tomorrow to confirm -Daily heparin level and CBC per protocol  Hart Robinsons A    02/03/2019,12:11 AM

## 2019-02-03 NOTE — Progress Notes (Signed)
Ch f/u with pt to see if her updated Ad was provided to the care team. Pt shared that she was admitted for PA and has a hx of DVA.Pt confirmed that the dau gave a copy to the pt's nurse. Pt presented to hv a positive affect while speaking w/ ch over the phone and was hopeful of being d/c soon.  Ch provided words of comfort.  No further needs at this time.    02/03/19 1200  Clinical Encounter Type  Visited With Patient  Visit Type Other (Comment) (AD info )  Advance Directives (For Healthcare)  Does Patient Have a Medical Advance Directive? Yes  Does patient want to make changes to medical advance directive? Yes (ED - Information included in AVS)  Type of Advance Directive [place];Living will  Copy of [place] in Chart? No - copy requested  Healthcare Power of Attorney Requested and Now in Chart Copy in chart  Copy of Living Will in Chart? No - copy requested  Living Will Requested and Now in Chart Copy in chart

## 2019-02-03 NOTE — Progress Notes (Signed)
Patient ID: Tracey Perez   Patient was admitted to [facility] on 02/02/2019 and discharged on 02/03/2019. Patient may return to work on 02/10/2019.  Dr Loletha Grayer [phone]

## 2019-02-03 NOTE — Consult Note (Signed)
ANTICOAGULATION CONSULT NOTE - Follow Up Consult  Pharmacy Consult for Heparin Indication: pulmonary embolus  Allergies  Allergen Reactions  . Morphine And Related Hives  . Nsaids Other (See Comments)    Affects platelet count  . Sulfa Antibiotics Shortness Of Breath  . Tape Other (See Comments)    bruises skin  . Verapamil Swelling  . Other Itching    Pt states she is allergic to the plastic that IV bags are made of. Reaction causes itching relieved by Benadryl.   . Acetazolamide Other (See Comments)    Acid reflux   . Seroquel [Quetiapine Fumarate] Other (See Comments)    Muscle cramps    Patient Measurements: Height: 5\' 3"  (160 cm) Weight: 166 lb 6.4 oz (75.5 kg) IBW/kg (Calculated) : 52.4 Heparin Dosing Weight: 68.4  Vital Signs: Temp: 98.6 F (37 C) (10/15 0722) Temp Source: Oral (10/15 0722) BP: 124/90 (10/15 0722) Pulse Rate: 85 (10/15 0722)  Labs: Recent Labs    02/02/19 1417 02/02/19 1602 02/02/19 2301 02/03/19 0453 02/03/19 0736  HGB 14.7  --   --  11.6*  --   HCT 43.0  --   --  34.6*  --   PLT 321  --   --  272  --   APTT  --  28  --   --   --   LABPROT  --  13.5  --   --   --   INR  --  1.0  --   --   --   HEPARINUNFRC  --   --  0.40  --  0.44  CREATININE 0.89  --   --  0.90  --   TROPONINIHS 3  --   --   --   --     Estimated Creatinine Clearance: 69.5 mL/min (by C-G formula based on SCr of 0.9 mg/dL).   Medications:  No anticoagulation per medication reconciliation  Assessment: 53 y/o F with history of anemia and DVT presents from clinic for left leg swelling and SOB. Labs significant for elevated D-dimer. CTA significant for bilateral PE with moderate clot burden and no evidence of right heart strain. Pharmacy consulted to start heparin.  Patient will be discharging today 10/15 per MD, and switching from heparin to apixaban 10 mg BID.  10/14 @ 2301 HL = 0.40, therapeutic x 1 10/15 @ 0700 HL = 0.44, therapeutic x 2  Goal of Therapy:   Heparin level 0.3-0.7 units/ml Monitor platelets by anticoagulation protocol: Yes   Plan:  -Nursing to stop heparin before administration of Eliquis. -Pt will start apixaban 10 mg BID and discharge.  Gerald Dexter   02/03/2019,9:05 AM

## 2019-02-03 NOTE — Progress Notes (Signed)
Pt discharged home via private car at 1310. Pt was A&Ox4. AVS reviewed with pt and all questions were answered. Pt left with clothes, glasses, pillow, and personal belongings. Pt wheeled downstairs by this RN.

## 2019-02-03 NOTE — Progress Notes (Signed)
*  PRELIMINARY RESULTS* Echocardiogram 2D Echocardiogram has been performed.  Tracey Perez 02/03/2019, 9:34 AM

## 2019-02-03 NOTE — Discharge Summary (Signed)
[place] [facility] [place] NAME: Tracey Perez    MR#:  [id number]  DATE OF BIRTH:  1966/01/05  DATE OF ADMISSION:  02/02/2019 ADMITTING PHYSICIAN: Otila Back, MD  DATE OF DISCHARGE: 02/03/2019  PRIMARY CARE PHYSICIAN: Patient, No Pcp Per    ADMISSION DIAGNOSIS:  Shortness of breath [R06.02] Elevated d-dimer [R79.89]  DISCHARGE DIAGNOSIS:  Active Problems:   Bilateral pulmonary embolism ([place])   SECONDARY DIAGNOSIS:   Past Medical History:  Diagnosis Date  . DVT (deep vein thrombosis) in pregnancy     HOSPITAL COURSE:   1.  Bilateral pulmonary embolism.  Patient was on heparin drip overnight.  She was feeling a lot better and wanted to go home.  I switched over to Eliquis.  The patient does not have insurance so she will contact the drug maker to see if she qualifies for patient assistance.  If not she will likely have to switch over to Coumadin as outpatient.  We will have her follow-up with hematology as outpatient.  She will likely need lifelong anticoagulation because she did have a history of DVT in the past.  Echocardiogram did not show any heart strain and a normal EF. 2.  Hypokalemia replaced yesterday 3.  Chronic back pain on oxycodone 4.  Depression on Effexor 5.  Elevated blood pressure on presentation likely secondary to shortness of breath and pain.  Did have low blood pressures overnight.  Hold off on any blood pressure medication at this time.   DISCHARGE CONDITIONS:   Satisfactory  CONSULTS OBTAINED:  None  DRUG ALLERGIES:   Allergies  Allergen Reactions  . Morphine And Related Hives  . Nsaids Other (See Comments)    Affects platelet count  . Sulfa Antibiotics Shortness Of Breath  . Tape Other (See Comments)    bruises skin  . Verapamil Swelling  . Other Itching    Pt states she is allergic to the plastic that IV bags are made of. Reaction causes itching relieved by Benadryl.   . Acetazolamide Other (See  Comments)    Acid reflux   . Seroquel [Quetiapine Fumarate] Other (See Comments)    Muscle cramps    DISCHARGE MEDICATIONS:   Allergies as of 02/03/2019      Reactions   Morphine And Related Hives   Nsaids Other (See Comments)   Affects platelet count   Sulfa Antibiotics Shortness Of Breath   Tape Other (See Comments)   bruises skin   Verapamil Swelling   Other Itching   Pt states she is allergic to the plastic that IV bags are made of. Reaction causes itching relieved by Benadryl.    Acetazolamide Other (See Comments)   Acid reflux   Seroquel [quetiapine Fumarate] Other (See Comments)   Muscle cramps      Medication List    TAKE these medications   apixaban 5 MG Tabs tablet Commonly known as: ELIQUIS Two tabs po twice a day for one week then one tab po twice a day afterwards   diphenhydrAMINE 25 mg capsule Commonly known as: BENADRYL Take 75 mg by mouth at bedtime as needed for allergies or sleep.   fluticasone 50 MCG/ACT nasal spray Commonly known as: FLONASE Place 1 spray into both nostrils daily.   mirtazapine 30 MG tablet Commonly known as: REMERON Take 15 mg by mouth daily.   oxycodone 30 MG immediate release tablet Commonly known as: ROXICODONE Take 30 mg by mouth 5 (five) times daily.   tiZANidine  4 MG tablet Commonly known as: ZANAFLEX Take 4 mg by mouth 3 (three) times daily as needed for muscle spasms.   venlafaxine XR 150 MG 24 hr capsule Commonly known as: EFFEXOR-XR Take 300 mg by mouth daily.   zolpidem 10 MG tablet Commonly known as: AMBIEN Take 10 mg by mouth at bedtime as needed for sleep.        DISCHARGE INSTRUCTIONS:   Set up PMD appointment Set up appointment with hematology in a few weeks.  If you experience worsening of your admission symptoms, develop shortness of breath, life threatening emergency, suicidal or homicidal thoughts you must seek medical attention immediately by calling 911 or calling your MD immediately  if  symptoms less severe.  You Must read complete instructions/literature along with all the possible adverse reactions/side effects for all the Medicines you take and that have been prescribed to you. Take any new Medicines after you have completely understood and accept all the possible adverse reactions/side effects.   Please note  You were cared for by a hospitalist during your hospital stay. If you have any questions about your discharge medications or the care you received while you were in the hospital after you are discharged, you can call the unit and asked to speak with the hospitalist on call if the hospitalist that took care of you is not available. Once you are discharged, your primary care physician will handle any further medical issues. Please note that NO REFILLS for any discharge medications will be authorized once you are discharged, as it is imperative that you return to your primary care physician (or establish a relationship with a primary care physician if you do not have one) for your aftercare needs so that they can reassess your need for medications and monitor your lab values.    Today   CHIEF COMPLAINT:   Chief Complaint  Patient presents with  . Shortness of Breath    HISTORY OF PRESENT ILLNESS:  Tracey Perez  is a 53 y.o. female coming in with shortness of breath and chest pain   VITAL SIGNS:  Blood pressure 124/90, pulse 85, temperature 98.6 F (37 C), temperature source Oral, resp. rate 18, height 5\' 3"  (1.6 m), weight 75.5 kg, SpO2 100 %.  I/O:    Intake/Output Summary (Last 24 hours) at 02/03/2019 1313 Last data filed at 02/03/2019 1025 Gross per 24 hour  Intake 210.58 ml  Output 800 ml  Net -589.42 ml    PHYSICAL EXAMINATION:  GENERAL:  53 y.o.-year-old patient lying in the bed with no acute distress.  EYES: Pupils equal, round, reactive to light and accommodation. No scleral icterus. Extraocular muscles intact.  HEENT: Head atraumatic,  normocephalic. Oropharynx and nasopharynx clear.  NECK:  Supple, no jugular venous distention. No thyroid enlargement, no tenderness.  LUNGS: Normal breath sounds bilaterally, no wheezing, rales,rhonchi or crepitation. No use of accessory muscles of respiration.  CARDIOVASCULAR: S1, S2 normal. No murmurs, rubs, or gallops.  ABDOMEN: Soft, non-tender, non-distended. Bowel sounds present. No organomegaly or mass.  EXTREMITIES: No pedal edema, cyanosis, or clubbing.  NEUROLOGIC: Cranial nerves II through XII are intact. Muscle strength 5/5 in all extremities. Sensation intact. Gait not checked.  PSYCHIATRIC: The patient is alert and oriented x 3.  SKIN: No obvious rash, lesion, or ulcer.   DATA REVIEW:   CBC Recent Labs  Lab 02/03/19 0453  WBC 5.7  HGB 11.6*  HCT 34.6*  PLT 272    Chemistries  Recent Labs  Lab  02/03/19 0453  NA 143  K 3.7  CL 110  CO2 24  GLUCOSE 115*  BUN 13  CREATININE 0.90  CALCIUM 8.9  MG 2.0    Cardiac Enzymes No results for input(s): TROPONINI in the last 168 hours.  Microbiology Results  Results for orders placed or performed during the hospital encounter of 02/02/19  SARS CORONAVIRUS 2 (TAT 6-24 HRS) Nasopharyngeal Nasopharyngeal Swab     Status: None   Collection Time: 02/02/19  4:02 PM   Specimen: Nasopharyngeal Swab  Result Value Ref Range Status   SARS Coronavirus 2 NEGATIVE NEGATIVE Final    Comment: (NOTE) SARS-CoV-2 target nucleic acids are NOT DETECTED. The SARS-CoV-2 RNA is generally detectable in upper and lower respiratory specimens during the acute phase of infection. Negative results do not preclude SARS-CoV-2 infection, do not rule out co-infections with other pathogens, and should not be used as the sole basis for treatment or other patient management decisions. Negative results must be combined with clinical observations, patient history, and epidemiological information. The expected result is Negative. Fact Sheet for  Patients: SugarRoll.be Fact Sheet for Healthcare Providers: https://www.woods-mathews.com/ This test is not yet approved or cleared by the [place] FDA and  has been authorized for detection and/or diagnosis of SARS-CoV-2 by FDA under an Emergency Use Authorization (EUA). This EUA will remain  in effect (meaning this test can be used) for the duration of the COVID-19 declaration under Section 56 4(b)(1) of the Act, 21 U.S.C. section 360bbb-3(b)(1), unless the authorization is terminated or revoked sooner. Performed at [facility], [place] [address], [place], [place] [address]     RADIOLOGY:  Ct Angio Chest Pe W/cm &/or Wo Cm  Addendum Date: 02/02/2019   ADDENDUM REPORT: 02/02/2019 16:02 ADDENDUM: 1.6 cm right thyroid nodule. Recommend further characterization with a thyroid ultrasound. Electronically Signed   By: Markus Daft M.D.   On: 02/02/2019 16:02   Result Date: 02/02/2019 CLINICAL DATA:  52 year old with shortness of breath and elevated D-dimer. EXAM: CT ANGIOGRAPHY CHEST WITH CONTRAST TECHNIQUE: Multidetector CT imaging of the chest was performed using the standard protocol during bolus administration of intravenous contrast. Multiplanar CT image reconstructions and MIPs were obtained to evaluate the vascular anatomy. CONTRAST:  18mL OMNIPAQUE IOHEXOL 350 MG/ML SOLN COMPARISON:  None. FINDINGS: Cardiovascular: Positive for bilateral pulmonary emboli. Scattered pulmonary emboli in the bilateral pulmonary arteries. Thrombus is predominantly in the lobar and segmental branches. Largest clot burden involves segmental branches in the right middle lobe and right lower lobe. Segmental pulmonary emboli in the left lower lobe. Small amount of clot burden in the upper lobes. Heart size is normal. No significant pericardial effusion. No evidence for right heart strain. Mediastinum/Nodes: Moderate sized hiatal hernia. Right thyroid nodule measuring  up to 1.6 cm on sequence 4, image 13. No significant mediastinal or hilar lymph node enlargement. No axillary lymph node enlargement. Lungs/Pleura: Trachea and mainstem bronchi are patent. Irregular peripheral densities in the right middle lobe could represent small pulmonary infarcts. 4 mm pleural-based nodule in the posterior right upper lobe on sequence 6 image 23. Small focus of ground-glass attenuation in the medial lingula on sequence 6, image 57 could represent atelectasis but nonspecific. Subtle peripheral ground-glass attenuation in the right lower lobe may represent atelectasis or mild infarct. No significant airspace disease or consolidation. No pleural effusions. Upper Abdomen: Images of the upper abdomen are unremarkable. Musculoskeletal: No acute bone abnormality. Review of the MIP images confirms the above findings. IMPRESSION: 1. Positive  for bilateral pulmonary emboli. Clot burden is moderate in size. No evidence for right heart strain. 2. Poorly defined pleural-based densities in the right middle lobe probably represent small infarcts. Additional small areas of ground-glass attenuation could represent infarct or atelectasis. 3. 4 mm pleural-based nodule in the right upper lobe. No follow-up needed if patient is low-risk. Non-contrast chest CT can be considered in 12 months if patient is high-risk. This recommendation follows the consensus statement: Guidelines for Management of Incidental Pulmonary Nodules Detected on CT Images: From the Fleischner Society 2017; Radiology 2017; 284:228-243. 4. Hiatal hernia. Critical Value/emergent results were called by telephone at the time of interpretation on 02/02/2019 at 3:38 pm to [facility] , who verbally acknowledged these results. Electronically Signed: By: Markus Daft M.D. On: 02/02/2019 15:42   US Venous Img Lower  Left (dvt Study)  Result Date: 02/02/2019 CLINICAL DATA:  Left lower extremity edema. Elevated D-dimer. Pulmonary embolism on  chest CTA. EXAM: LEFT LOWER EXTREMITY VENOUS DOPPLER ULTRASOUND TECHNIQUE: Gray-scale sonography with graded compression, as well as color Doppler and duplex ultrasound were performed to evaluate the lower extremity deep venous systems from the level of the common femoral vein and including the common femoral, femoral, profunda femoral, popliteal and calf veins including the posterior tibial, peroneal and gastrocnemius veins when visible. The superficial great saphenous vein was also interrogated. Spectral Doppler was utilized to evaluate flow at rest and with distal augmentation maneuvers in the common femoral, femoral and popliteal veins. COMPARISON:  Chest CTA 02/02/2019 FINDINGS: Contralateral Common Femoral Vein: Reportedly, the patient did not tolerate complete compression of the right common femoral vein. Right common femoral vein appears to be patent on color Doppler images without echogenic thrombus. Common Femoral Vein: No evidence of thrombus. Normal compressibility, respiratory phasicity and response to augmentation. Saphenofemoral Junction: No evidence of thrombus. Normal compressibility and flow on color Doppler imaging. Profunda Femoral Vein: No evidence of thrombus. Normal compressibility and flow on color Doppler imaging. Femoral Vein: No evidence of thrombus. Normal compressibility, respiratory phasicity and response to augmentation. Popliteal Vein: No evidence of thrombus. Normal compressibility, respiratory phasicity and response to augmentation. Calf Veins: Visualized left deep calf veins are patent without thrombus. Other Findings:  None. IMPRESSION: Negative for deep venous thrombosis in left lower extremity. Electronically Signed   By: Markus Daft M.D.   On: 02/02/2019 15:20     Management plans discussed with the patient, family and they are in agreement.  CODE STATUS:     Code Status Orders  (From admission, onward)         Start     Ordered   02/02/19 1612  Do not attempt  resuscitation (DNR)  Continuous    Question Answer Comment  In the event of cardiac or respiratory ARREST Do not call a "code blue"   In the event of cardiac or respiratory ARREST Do not perform Intubation, CPR, defibrillation or ACLS   In the event of cardiac or respiratory ARREST Use medication by any route, position, wound care, and other measures to relive pain and suffering. May use oxygen, suction and manual treatment of airway obstruction as needed for comfort.   Comments Patient clearly wishes to be DNR/DNI      02/02/19 1612        Code Status History    This patient has a current code status but no historical code status.   Advance Care Planning Activity    Advance Directive Documentation     Most Recent  Value  Type of Advance Directive  Healthcare Power of Attorney, Living will  Pre-existing out of facility DNR order (yellow form or pink MOST form)  -  "MOST" Form in Place?  -      TOTAL TIME TAKING CARE OF THIS PATIENT: 37 minutes.    Loletha Grayer M.D on 02/03/2019 at 1:13 PM  Between 7am to 6pm - Pager - [phone]  After 6pm go to [email or web address] - password [email or web address]  [employer] [facility]  [phone]  CC: Primary care physician; Patient, No Pcp Per

## 2019-02-10 ENCOUNTER — Ambulatory Visit: Payer: Self-pay

## 2019-02-10 ENCOUNTER — Other Ambulatory Visit: Payer: Self-pay

## 2019-02-15 ENCOUNTER — Ambulatory Visit: Payer: Self-pay | Admitting: Gerontology

## 2019-02-16 ENCOUNTER — Other Ambulatory Visit: Payer: Self-pay

## 2019-02-16 ENCOUNTER — Ambulatory Visit: Payer: Self-pay | Admitting: Gerontology

## 2019-02-16 ENCOUNTER — Encounter: Payer: Self-pay | Admitting: Gerontology

## 2019-02-16 VITALS — BP 157/104 | HR 102 | Ht 63.0 in | Wt 171.0 lb

## 2019-02-16 DIAGNOSIS — I2699 Other pulmonary embolism without acute cor pulmonale: Secondary | ICD-10-CM

## 2019-02-16 DIAGNOSIS — Z7689 Persons encountering health services in other specified circumstances: Secondary | ICD-10-CM

## 2019-02-16 DIAGNOSIS — G8929 Other chronic pain: Secondary | ICD-10-CM

## 2019-02-16 DIAGNOSIS — R030 Elevated blood-pressure reading, without diagnosis of hypertension: Secondary | ICD-10-CM

## 2019-02-16 DIAGNOSIS — Z889 Allergy status to unspecified drugs, medicaments and biological substances status: Secondary | ICD-10-CM

## 2019-02-16 MED ORDER — APIXABAN 5 MG PO TABS: ORAL_TABLET | ORAL | 5 refills | Status: DC

## 2019-02-16 MED ORDER — FLUTICASONE PROPIONATE 50 MCG/ACT NA SUSP: 1.0000 | Freq: Every day | NASAL | 2 refills | Status: DC

## 2019-02-16 MED ORDER — BLOOD PRESSURE KIT KIT: 1.0000 | PACK | Freq: Two times a day (BID) | 0 refills | Status: AC

## 2019-02-16 NOTE — Progress Notes (Signed)
Patient ID: Tracey Perez, female   DOB: 08/17/1965, 53 y.o.   MRN: [id number]  Chief Complaint  Patient presents with  . Establish Care    recent dx of PE    HPI Tracey Perez is a 53 y.o. female who presents to establish care and evaluation of her chronic conditions. She was discharged from the hospital on 02/03/2019 for  Bilateral pulmonary embolism and continues to take Eliquis 5 mg bid. She denies bruises, hematuria, hematochezia and dark tarry stool. She reports that she continues to experience intermittent shortness of breath when walking and talking at the same time. She reports that she has chronic back pain and history of depression. She states that Dr Howell Rucks at [facility] manages her back pain and depression. During office visit her blood pressure was elevated, she states that her blood pressure is usually normal. She states that she has not had Mammogram and requests Influenza vaccine. She states that her mood is good, denies suicidal or homicidal ideation. She states that she's doing well and offers no further complaint.   Past Medical History:  Diagnosis Date  . DVT (deep vein thrombosis) in pregnancy     Past Surgical History:  Procedure Laterality Date  . ABDOMINAL HYSTERECTOMY    . TONSILLECTOMY      No family history on file.  Social History Social History   Tobacco Use  . Smoking status: Former Research scientist (life sciences)  . Smokeless tobacco: Never Used  Substance Use Topics  . Alcohol use: Never    Frequency: Never  . Drug use: Not on file    Allergies  Allergen Reactions  . Morphine And Related Hives  . Nsaids Other (See Comments)    Affects platelet count  . Sulfa Antibiotics Shortness Of Breath  . Tape Other (See Comments)    bruises skin  . Verapamil Swelling  . Other Itching    Pt states she is allergic to the plastic that IV bags are made of. Reaction causes itching relieved by Benadryl.   . Acetazolamide Other (See Comments)    Acid reflux   .  Seroquel [Quetiapine Fumarate] Other (See Comments)    Muscle cramps    Current Outpatient Medications  Medication Sig Dispense Refill  . apixaban (ELIQUIS) 5 MG TABS tablet Two tabs po twice a day for one week then one tab po twice a day afterwards 60 tablet 5  . diphenhydrAMINE (BENADRYL) 25 mg capsule Take 75 mg by mouth at bedtime as needed for allergies or sleep.    . fluticasone (FLONASE) 50 MCG/ACT nasal spray Place 1 spray into both nostrils daily. 16 g 2  . mirtazapine (REMERON) 30 MG tablet Take 15 mg by mouth daily.    Marland Kitchen oxycodone (ROXICODONE) 30 MG immediate release tablet Take 30 mg by mouth 5 (five) times daily.    Marland Kitchen tiZANidine (ZANAFLEX) 4 MG tablet Take 4 mg by mouth 3 (three) times daily as needed for muscle spasms.    Marland Kitchen venlafaxine XR (EFFEXOR-XR) 150 MG 24 hr capsule Take 300 mg by mouth daily.    Marland Kitchen zolpidem (AMBIEN) 10 MG tablet Take 10 mg by mouth at bedtime as needed for sleep.    . Blood Pressure Monitoring (BLOOD PRESSURE KIT) KIT 1 kit by Does not apply route 2 (two) times daily. 1 kit 0   No current facility-administered medications for this visit.     Review of Systems Review of Systems  Constitutional: Negative.   HENT: Negative.  Eyes: Negative.   Respiratory: Positive for shortness of breath.   Cardiovascular: Negative.   Gastrointestinal: Negative.   Endocrine: Negative.   Genitourinary: Negative.   Musculoskeletal: Positive for back pain.  Skin: Negative.   Neurological: Negative.   Hematological: Negative.   Psychiatric/Behavioral: Negative.     Blood pressure (!) 157/104, pulse (!) 102, height _0  (1.6 m), weight 171 lb (77.6 kg), SpO2 94 %.  Physical Exam Review of Systems  Constitutional: Negative.   HENT: Negative.   Eyes: Negative.   Respiratory: Negative.   Cardiovascular: Negative.   Gastrointestinal: Negative.   Endocrine: Negative.   Genitourinary: Negative.   Musculoskeletal: Positive for back pain  Skin: Negative.    Neurological: Negative.   Hematological: Negative.   Psychiatric/Behavioral: Negative.    Data Reviewed Lab result and past medical history was reviewed.  Assessment and Plan  1. Encounter to establish care - Routine labs will be checked - Lipid panel; Future - Thyroid Panel With TSH - HgB A1c - Ambulatory referral to Hematology / Oncology - Urinalysis; Future - Flu Vaccine QUAD 6+ mos PF IM (Fluarix Quad PF) was administered.  2. Bilateral pulmonary embolism ([place]) - She was advised to go to the ED with worsening shortness of breath. She will continue on current treatment regimen and was advised to notify to go to the ED for active bleeding. - apixaban (ELIQUIS) 5 MG TABS tablet; Two tabs po twice a day for one week then one tab po twice a day afterwards  Dispense: 60 tablet; Refill: 5  3. Elevated blood pressure reading - Her blood pressure was rechecked and it was 157/104, she was provided with blood pressure machine and advised to check, record and bring log to follow up. She was advised to notify clinic if blood pressure is continuously greater than 140/90.  - Blood Pressure Monitoring (BLOOD PRESSURE KIT) KIT; 1 kit by Does not apply route 2 (two) times daily.  Dispense: 1 kit; Refill: 0  4. History of allergy - She will continue on current medication. - fluticasone (FLONASE) 50 MCG/ACT nasal spray; Place 1 spray into both nostrils daily.  Dispense: 16 g; Refill: 2    5. Chronic low back pain, unspecified back pain laterality, unspecified whether sciatica present - She will continue to follow up with Dr Holland Falling at [facility].  Follow up:  On 03/22/2019 or if symptom worsens or fails to improve.  Ranjit Ashurst E Jye Fariss 02/16/2019, 9:34 PM

## 2019-02-16 NOTE — Patient Instructions (Signed)

## 2019-02-17 ENCOUNTER — Inpatient Hospital Stay: Payer: Self-pay | Attending: Oncology | Admitting: Oncology

## 2019-02-17 ENCOUNTER — Other Ambulatory Visit: Payer: Self-pay

## 2019-02-17 ENCOUNTER — Encounter: Payer: Self-pay | Admitting: Oncology

## 2019-02-17 ENCOUNTER — Inpatient Hospital Stay: Payer: Self-pay

## 2019-02-17 VITALS — BP 119/73 | HR 86 | Temp 98.2°F | Resp 16 | Ht 63.0 in | Wt 176.4 lb

## 2019-02-17 DIAGNOSIS — Z79899 Other long term (current) drug therapy: Secondary | ICD-10-CM | POA: Insufficient documentation

## 2019-02-17 DIAGNOSIS — Z7901 Long term (current) use of anticoagulants: Secondary | ICD-10-CM | POA: Insufficient documentation

## 2019-02-17 DIAGNOSIS — Z8639 Personal history of other endocrine, nutritional and metabolic disease: Secondary | ICD-10-CM

## 2019-02-17 DIAGNOSIS — Z86718 Personal history of other venous thrombosis and embolism: Secondary | ICD-10-CM | POA: Insufficient documentation

## 2019-02-17 DIAGNOSIS — I10 Essential (primary) hypertension: Secondary | ICD-10-CM | POA: Insufficient documentation

## 2019-02-17 DIAGNOSIS — F17210 Nicotine dependence, cigarettes, uncomplicated: Secondary | ICD-10-CM | POA: Insufficient documentation

## 2019-02-17 DIAGNOSIS — I2699 Other pulmonary embolism without acute cor pulmonale: Secondary | ICD-10-CM | POA: Insufficient documentation

## 2019-02-17 DIAGNOSIS — E041 Nontoxic single thyroid nodule: Secondary | ICD-10-CM | POA: Insufficient documentation

## 2019-02-17 LAB — HEMOGLOBIN A1C
Est. average glucose Bld gHb Est-mCnc: 120 mg/dL
Hgb A1c MFr Bld: 5.8 % — ABNORMAL HIGH (ref 4.8–5.6)

## 2019-02-17 LAB — IRON AND TIBC
Iron: 72 ug/dL (ref 28–170)
Saturation Ratios: 20 % (ref 10.4–31.8)
TIBC: 366 ug/dL (ref 250–450)
UIBC: 294 ug/dL

## 2019-02-17 LAB — THYROID PANEL WITH TSH
Free Thyroxine Index: 1.3 (ref 1.2–4.9)
T3 Uptake Ratio: 23 % — ABNORMAL LOW (ref 24–39)
T4, Total: 5.7 ug/dL (ref 4.5–12.0)
TSH: 1.04 u[IU]/mL (ref 0.450–4.500)

## 2019-02-17 LAB — FERRITIN: Ferritin: 90 ng/mL (ref 11–307)

## 2019-02-17 LAB — ANTITHROMBIN III: AntiThromb III Func: 114 % (ref 75–120)

## 2019-02-17 NOTE — Progress Notes (Signed)
Patient stated that she had been on Eliquis for two days and she believes that she started having an itch due to Eliquis, Patient had anemia before and had received iron infusion before.

## 2019-02-17 NOTE — Progress Notes (Signed)
Hematology/Oncology Consult note [facility] [phone] [phone]  Patient Care Team: Langston Reusing, NP as PCP - General (Gerontology)   Name of the patient: Tracey Perez  [id number]  12/09/1965    Reason for referral- Caryl Asp NP   Referring physician-new diagnosis of pulmonary embolism  Date of visit: 02/17/19   History of presenting illness-patient is a 53 year old female with a past medical history significant for migraine, hypertension among other medical problems.  She had a prior history of left lower extremity DVT back in 2003 when she took Coumadin for a year and then stopped it.  That was an unprovoked episode and there was no precipitating factors such as prolonged air road travel, infections hospitalization or surgery.  Patient was doing well up until 2020 when she experienced exertional shortness of breath which lasted for a week and did not get better.  She went to the ER and underwent a CT chest which showed bilateral PE without right heart strain.  Left Lower extremity Doppler was also done given her symptoms of swelling and was negative for DVT.  Patient was discharged on Eliquis.  Patient does not have any insurance and is currently getting Eliquis through [facility].  She reports some itching since she started Eliquis.  No history of any pregnancy losses.  No family history of DVT or PE.  She is currently not on any home oxygen  ECOG PS- 1  Pain scale- 0   Review of systems- Review of Systems  Constitutional: Positive for malaise/fatigue. Negative for chills, fever and weight loss.  HENT: Negative for congestion, ear discharge and nosebleeds.   Eyes: Negative for blurred vision.  Respiratory: Positive for shortness of breath (Exertional shortness of breath). Negative for cough, hemoptysis, sputum production and wheezing.   Cardiovascular: Negative for chest pain, palpitations, orthopnea and  claudication.  Gastrointestinal: Negative for abdominal pain, blood in stool, constipation, diarrhea, heartburn, melena, nausea and vomiting.  Genitourinary: Negative for dysuria, flank pain, frequency, hematuria and urgency.  Musculoskeletal: Negative for back pain, joint pain and myalgias.  Skin: Negative for rash.  Neurological: Negative for dizziness, tingling, focal weakness, seizures, weakness and headaches.  Endo/Heme/Allergies: Does not bruise/bleed easily.  Psychiatric/Behavioral: Negative for depression and suicidal ideas. The patient does not have insomnia.     Allergies  Allergen Reactions   Morphine And Related Hives   Nsaids Other (See Comments)    Affects platelet count   Sulfa Antibiotics Shortness Of Breath   Tape Other (See Comments)    bruises skin   Verapamil Swelling   Other Itching    Pt states she is allergic to the plastic that IV bags are made of. Reaction causes itching relieved by Benadryl.    Acetazolamide Other (See Comments)    Acid reflux    Seroquel [Quetiapine Fumarate] Other (See Comments)    Muscle cramps    Patient Active Problem List   Diagnosis Date Noted   Encounter to establish care 02/16/2019   Elevated blood pressure reading 02/16/2019   Bilateral pulmonary embolism ([place]) 02/02/2019   BPPV (benign paroxysmal positional vertigo) 07/22/2016   Intractable migraine without aura and without status migrainosus 01/01/2016   Social phobia, generalized 04/04/2015   Chronic pain 11/18/2013   Colon polyps 10/25/2013   Hemorrhoids 10/25/2013   Lumbar radicular pain 06/29/2013   Calcaneal bursitis 02/01/2013   Pain in foot 02/01/2013   Plantar fasciitis 02/01/2013   Venous thrombosis and embolism 08/17/2012   Family  history of breast cancer 08/03/2012   Chronic SI joint pain 08/21/2011   Bleeding disorder ([place]) 07/01/2011   Depression 07/01/2011   Diverticulosis 07/01/2011   Insomnia 07/01/2011   Iron  deficiency anemia 07/01/2011   Degenerative lumbar disc 06/23/2011   Facet arthritis of lumbar region 06/23/2011     Past Medical History:  Diagnosis Date   DVT (deep vein thrombosis) in pregnancy      Past Surgical History:  Procedure Laterality Date   ABDOMINAL HYSTERECTOMY     TONSILLECTOMY      Social History   Socioeconomic History   Marital status: Divorced    Spouse name: Not on file   Number of children: Not on file   Years of education: Not on file   Highest education level: Not on file  Occupational History   Not on file  Social Needs   Financial resource strain: Not on file   Food insecurity    Worry: Not on file    Inability: Not on file   Transportation needs    Medical: Not on file    Non-medical: Not on file  Tobacco Use   Smoking status: Former Smoker    Packs/day: 1.00    Years: 25.00    Pack years: 25.00    Types: Cigarettes    Quit date: 12/17/2011    Years since quitting: 7.1   Smokeless tobacco: Never Used  Substance and Sexual Activity   Alcohol use: Never    Frequency: Never   Drug use: Not on file   Sexual activity: Not on file  Lifestyle   Physical activity    Days per week: Not on file    Minutes per session: Not on file   Stress: Not on file  Relationships   Social connections    Talks on phone: Not on file    Gets together: Not on file    Attends religious service: Not on file    Active member of club or organization: Not on file    Attends meetings of clubs or organizations: Not on file    Relationship status: Not on file   Intimate partner violence    Fear of current or ex partner: Not on file    Emotionally abused: Not on file    Physically abused: Not on file    Forced sexual activity: Not on file  Other Topics Concern   Not on file  Social History Narrative   Not on file     Family History  Problem Relation Age of Onset   Non-Hodgkin's lymphoma Mother    Hypertension Father     Depression Father    Alcohol abuse Father    Migraines Father    Multiple sclerosis Sister    Migraines Sister    Migraines Brother    Thyroid disease Brother    Migraines Brother    Thyroid disease Brother    Thyroid disease Brother    Thyroid disease Brother      Current Outpatient Medications:    apixaban (ELIQUIS) 5 MG TABS tablet, Two tabs po twice a day for one week then one tab po twice a day afterwards, Disp: 60 tablet, Rfl: 5   Blood Pressure Monitoring (BLOOD PRESSURE KIT) KIT, 1 kit by Does not apply route 2 (two) times daily., Disp: 1 kit, Rfl: 0   diphenhydrAMINE (BENADRYL) 25 mg capsule, Take 75 mg by mouth at bedtime as needed for allergies or sleep., Disp: , Rfl:    mirtazapine (  REMERON) 30 MG tablet, Take 15 mg by mouth daily., Disp: , Rfl:    oxycodone (ROXICODONE) 30 MG immediate release tablet, Take 30 mg by mouth 5 (five) times daily., Disp: , Rfl:    tiZANidine (ZANAFLEX) 4 MG tablet, Take 4 mg by mouth 3 (three) times daily as needed for muscle spasms., Disp: , Rfl:    venlafaxine XR (EFFEXOR-XR) 150 MG 24 hr capsule, Take 300 mg by mouth daily., Disp: , Rfl:    zolpidem (AMBIEN) 10 MG tablet, Take 10 mg by mouth at bedtime as needed for sleep., Disp: , Rfl:    fluticasone (FLONASE) 50 MCG/ACT nasal spray, Place 1 spray into both nostrils daily. (Patient not taking: Reported on 02/17/2019), Disp: 16 g, Rfl: 2   Physical exam:  Vitals:   02/17/19 1100 02/17/19 1127  BP: 119/73   Pulse: 86   Resp: 16   Temp: 98.2 F (36.8 C)   TempSrc: Temporal   SpO2: 96%   Weight: 176 lb 6.4 oz (80 kg)   Height:  '5\' 3"'$  (1.6 m)   Physical Exam Constitutional:      General: She is not in acute distress. HENT:     Head: Normocephalic and atraumatic.  Eyes:     Pupils: Pupils are equal, round, and reactive to light.  Neck:     Musculoskeletal: Normal range of motion.  Cardiovascular:     Rate and Rhythm: Normal rate and regular rhythm.     Heart  sounds: Normal heart sounds.  Pulmonary:     Effort: Pulmonary effort is normal.     Breath sounds: Normal breath sounds.  Abdominal:     General: Bowel sounds are normal.     Palpations: Abdomen is soft.  Skin:    General: Skin is warm and dry.  Neurological:     Mental Status: She is alert and oriented to person, place, and time.        CMP Latest Ref Rng & Units 02/03/2019  Glucose 70 - 99 mg/dL 115(H)  BUN 6 - 20 mg/dL 13  Creatinine 0.44 - 1.00 mg/dL 0.90  Sodium 135 - 145 mmol/L 143  Potassium 3.5 - 5.1 mmol/L 3.7  Chloride 98 - 111 mmol/L 110  CO2 22 - 32 mmol/L 24  Calcium 8.9 - 10.3 mg/dL 8.9   CBC Latest Ref Rng & Units 02/03/2019  WBC 4.0 - 10.5 K/uL 5.7  Hemoglobin 12.0 - 15.0 g/dL 11.6(L)  Hematocrit 36.0 - 46.0 % 34.6(L)  Platelets 150 - 400 K/uL 272    No images are attached to the encounter.  Ct Angio Chest Pe W/cm &/or Wo Cm  Addendum Date: 02/02/2019   ADDENDUM REPORT: 02/02/2019 16:02 ADDENDUM: 1.6 cm right thyroid nodule. Recommend further characterization with a thyroid ultrasound. Electronically Signed   By: Markus Daft M.D.   On: 02/02/2019 16:02   Result Date: 02/02/2019 CLINICAL DATA:  53 year old with shortness of breath and elevated D-dimer. EXAM: CT ANGIOGRAPHY CHEST WITH CONTRAST TECHNIQUE: Multidetector CT imaging of the chest was performed using the standard protocol during bolus administration of intravenous contrast. Multiplanar CT image reconstructions and MIPs were obtained to evaluate the vascular anatomy. CONTRAST:  24m OMNIPAQUE IOHEXOL 350 MG/ML SOLN COMPARISON:  None. FINDINGS: Cardiovascular: Positive for bilateral pulmonary emboli. Scattered pulmonary emboli in the bilateral pulmonary arteries. Thrombus is predominantly in the lobar and segmental branches. Largest clot burden involves segmental branches in the right middle lobe and right lower lobe. Segmental pulmonary emboli in the  left lower lobe. Small amount of clot burden in the  upper lobes. Heart size is normal. No significant pericardial effusion. No evidence for right heart strain. Mediastinum/Nodes: Moderate sized hiatal hernia. Right thyroid nodule measuring up to 1.6 cm on sequence 4, image 13. No significant mediastinal or hilar lymph node enlargement. No axillary lymph node enlargement. Lungs/Pleura: Trachea and mainstem bronchi are patent. Irregular peripheral densities in the right middle lobe could represent small pulmonary infarcts. 4 mm pleural-based nodule in the posterior right upper lobe on sequence 6 image 23. Small focus of ground-glass attenuation in the medial lingula on sequence 6, image 57 could represent atelectasis but nonspecific. Subtle peripheral ground-glass attenuation in the right lower lobe may represent atelectasis or mild infarct. No significant airspace disease or consolidation. No pleural effusions. Upper Abdomen: Images of the upper abdomen are unremarkable. Musculoskeletal: No acute bone abnormality. Review of the MIP images confirms the above findings. IMPRESSION: 1. Positive for bilateral pulmonary emboli. Clot burden is moderate in size. No evidence for right heart strain. 2. Poorly defined pleural-based densities in the right middle lobe probably represent small infarcts. Additional small areas of ground-glass attenuation could represent infarct or atelectasis. 3. 4 mm pleural-based nodule in the right upper lobe. No follow-up needed if patient is low-risk. Non-contrast chest CT can be considered in 12 months if patient is high-risk. This recommendation follows the consensus statement: Guidelines for Management of Incidental Pulmonary Nodules Detected on CT Images: From the Fleischner Society 2017; Radiology 2017; 284:228-243. 4. Hiatal hernia. Critical Value/emergent results were called by telephone at the time of interpretation on 02/02/2019 at 3:38 pm to [facility] , who verbally acknowledged these results. Electronically Signed: By: Markus Daft M.D. On: 02/02/2019 15:42   US Venous Img Lower  Left (dvt Study)  Result Date: 02/02/2019 CLINICAL DATA:  Left lower extremity edema. Elevated D-dimer. Pulmonary embolism on chest CTA. EXAM: LEFT LOWER EXTREMITY VENOUS DOPPLER ULTRASOUND TECHNIQUE: Gray-scale sonography with graded compression, as well as color Doppler and duplex ultrasound were performed to evaluate the lower extremity deep venous systems from the level of the common femoral vein and including the common femoral, femoral, profunda femoral, popliteal and calf veins including the posterior tibial, peroneal and gastrocnemius veins when visible. The superficial great saphenous vein was also interrogated. Spectral Doppler was utilized to evaluate flow at rest and with distal augmentation maneuvers in the common femoral, femoral and popliteal veins. COMPARISON:  Chest CTA 02/02/2019 FINDINGS: Contralateral Common Femoral Vein: Reportedly, the patient did not tolerate complete compression of the right common femoral vein. Right common femoral vein appears to be patent on color Doppler images without echogenic thrombus. Common Femoral Vein: No evidence of thrombus. Normal compressibility, respiratory phasicity and response to augmentation. Saphenofemoral Junction: No evidence of thrombus. Normal compressibility and flow on color Doppler imaging. Profunda Femoral Vein: No evidence of thrombus. Normal compressibility and flow on color Doppler imaging. Femoral Vein: No evidence of thrombus. Normal compressibility, respiratory phasicity and response to augmentation. Popliteal Vein: No evidence of thrombus. Normal compressibility, respiratory phasicity and response to augmentation. Calf Veins: Visualized left deep calf veins are patent without thrombus. Other Findings:  None. IMPRESSION: Negative for deep venous thrombosis in left lower extremity. Electronically Signed   By: Markus Daft M.D.   On: 02/02/2019 15:20    Assessment and plan- Patient  is a 53 y.o. female with newly diagnosed bilateral pulmonary embolism  This is patient's second episode of thrombosis.  Her she had a first  episode of left lower extremity DVT back in 2003.  Both these episodes were seemingly unprovoked.  Patient has no family history of thrombosis.  I currently recommend indefinite anticoagulation with Eliquis.  Eliquis does carry a lower risk of bleeding as compared to Coumadin does not require any INR monitoring or dietary modifications.  I will do a hypercoagulable work-up today including factor V Leiden, prothrombin gene mutation, protein C protein S, Antithrombin III as well as antiphospholipid antibody syndrome panel.  Hypercoagulable work-up will not change management in her case as she requires indefinite anticoagulation anyways.  However if she was found to have evidence of antiphospholipid antibody syndrome there is evidence to suggest that Coumadin is better than newer anticoagulants.  However she would need 2 sets of blood values 12 weeks apart to make the diagnosis of APS.  If she has any known genetic mutations it would also have repercussions for her children.  Malignancy work-up is not routinely recommended for PE.  However I have encouraged the patient to talk to her primary care provider about being up-to-date with her mammograms.  She has had a prior colonoscopy about 5 years ago per patient.  This needs to be discussed with her primary care doctor as well  Patient was found to have nonspecific pulmonary nodules which will require a CT chest without contrast in 1 years time.  I will order that.  Patient noted to have thyroid nodule which will need further work-up by her primary care provider  Patient has seen hematology in the past for history of iron deficiency anemia.  Presently patient is not anemic.  I will check her iron studies today  Patient can return back to her work at this time but avoid lifting any heavy weights.  I have given her  temporary disability for 3 months given her recent history of PE.  She is not on any home oxygen and I would not be able to give her any long-term disability beyond 3 months  I will see the patient back in 3 weeks time to discuss the results of her hypercoagulable work-up via video visit   Thank you for this kind referral and the opportunity to participate in the care of this patient   Visit Diagnosis 1. Bilateral pulmonary embolism ([place])   2. History of iron deficiency     Dr. Randa Evens, MD, MPH [facility] [phone] 02/17/2019 12:26 PM

## 2019-02-18 LAB — CARDIOLIPIN ANTIBODIES, IGG, IGM, IGA
Anticardiolipin IgA: <9 APL U/mL (ref 0–11)
Anticardiolipin IgG: <9 GPL U/mL (ref 0–14)
Anticardiolipin IgM: <9 MPL U/mL (ref 0–12)

## 2019-02-19 LAB — BETA-2-GLYCOPROTEIN I ABS, IGG/M/A
Beta-2 Glyco I IgG: <9 GPI IgG units (ref 0–20)
Beta-2-Glycoprotein I IgA: <9 GPI IgA units (ref 0–25)
Beta-2-Glycoprotein I IgM: <9 GPI IgM units (ref 0–32)

## 2019-02-19 LAB — PROTEIN C, TOTAL: Protein C, Total: 113 % (ref 60–150)

## 2019-02-21 LAB — LUPUS ANTICOAGULANT
DRVVT: 41.8 s (ref 0.0–47.0)
PTT Lupus Anticoagulant: 26.8 s (ref 0.0–51.9)
Thrombin Time: 19.1 s (ref 0.0–23.0)
dPT Confirm Ratio: 1.03 Ratio (ref 0.00–1.40)
dPT: 39.8 s (ref 0.0–55.0)

## 2019-02-21 LAB — HEX PHASE PHOSPHOLIPID REFLEX

## 2019-02-21 LAB — PROTEIN S PANEL
Protein S Activity: 103 % (ref 63–140)
Protein S Ag, Free: 184 % — ABNORMAL HIGH (ref 57–157)
Protein S Ag, Total: 134 % (ref 60–150)

## 2019-02-21 LAB — HEXAGONAL PHASE PHOSPHOLIPID: Hex Phosph Neut Test: 0 s (ref 0–11)

## 2019-02-23 ENCOUNTER — Other Ambulatory Visit: Payer: Self-pay

## 2019-02-23 ENCOUNTER — Ambulatory Visit: Payer: Self-pay | Admitting: Pharmacy Technician

## 2019-02-23 DIAGNOSIS — Z79899 Other long term (current) drug therapy: Secondary | ICD-10-CM

## 2019-02-23 LAB — PROTHROMBIN GENE MUTATION

## 2019-02-23 NOTE — Progress Notes (Signed)
Completed [facility] application and contract.  Patient agreed to all terms of the [facility] contract.    Patient approved to receive medication assistance at [facility] as long as eligibility criteria continues to be met.    Provided patient with community resource material based on her particular needs.    Eliquis Prescription Application completed with patient.  Forwarded to [facility] for signature.  Patient inquiring as to when Eliquis medication would be sent to [facility].  Explained that it will be around 6 weeks.  Sending message to [facility] to discuss with patient about obtaining samples or coming up with another medication to be used during the interim while [facility] is waiting on receiving the Eliquis.  Upon receipt of signed application from provider, the Eliquis Prescription Application will be submitted to [facility].  Provided patient with RX Outreach application to obtain the Ambien.  Patient is to coordinate with prescribing provider to complete application and obtain prescription.  Then patient has agreed to submit completed application, prescription and payment information to [employer].  Referred patient for MTM.  [place] [facility]

## 2019-02-24 ENCOUNTER — Telehealth: Payer: Self-pay | Admitting: Pharmacist

## 2019-02-24 ENCOUNTER — Other Ambulatory Visit: Payer: Self-pay

## 2019-02-24 MED ORDER — RIVAROXABAN 20 MG PO TABS: 20.0000 mg | ORAL_TABLET | Freq: Every day | ORAL | 1 refills | Status: DC

## 2019-02-24 NOTE — Telephone Encounter (Signed)
02/24/2019 8:52:19 AM - Eliquis faxed to [facility]  02/24/2019 Faxed [facility] application for [employer] 5mg  Take two tablets by mouth 2 times a day for one week, then take one tablet 2 times a day thereafter. I did send a letter indicating income amount has decreased.Tracey Perez

## 2019-02-25 LAB — FACTOR 5 LEIDEN

## 2019-02-28 ENCOUNTER — Other Ambulatory Visit: Payer: Self-pay

## 2019-02-28 ENCOUNTER — Ambulatory Visit: Payer: Self-pay | Admitting: Pharmacist

## 2019-02-28 DIAGNOSIS — Z79899 Other long term (current) drug therapy: Secondary | ICD-10-CM

## 2019-02-28 NOTE — Progress Notes (Signed)
Medication Management Clinic Visit Note  Patient: Tracey Perez MRN: [id number] Date of Birth: 1966/02/22 PCP: Langston Reusing, NP   [name] 53 y.o. female presents for a initial MTM visit today.  There were no vitals taken for this visit.  Patient Information   Past Medical History:  Diagnosis Date  . DVT (deep vein thrombosis) in pregnancy       Past Surgical History:  Procedure Laterality Date  . ABDOMINAL HYSTERECTOMY    . TONSILLECTOMY       Family History  Problem Relation Age of Onset  . Non-Hodgkin's lymphoma Mother   . Hypertension Father   . Depression Father   . Alcohol abuse Father   . Migraines Father   . Multiple sclerosis Sister   . Migraines Sister   . Migraines Brother   . Thyroid disease Brother   . Migraines Brother   . Thyroid disease Brother   . Thyroid disease Brother   . Thyroid disease Brother     New Diagnoses (since last visit):  Recent pulmonary embolism in 01/2019.   Family Support: Lives alone.  Daughter is a Equities trader at [employer], lives in [place].  Lifestyle:  Patient is on her feet 8-10 hours/day.  Works for [employer]. Diet: Breakfast:  Does not normally eat breakfast Lunch:  PB&J sandwich, fried chicken from [employer]. Dinner:  Cereal (Frosted shredded wheat, Chex).  Sometimes slow-cooked pot roast (~1 time monthly) Drinks:  Mostly water, 2% milk, chocolate milk, ovaltine   Social History   Substance and Sexual Activity  Alcohol Use Never  . Frequency: Never      Social History   Tobacco Use  Smoking Status Former Smoker  . Packs/day: 1.00  . Years: 25.00  . Pack years: 25.00  . Types: Cigarettes  . Quit date: 12/17/2011  . Years since quitting: 7.2  Smokeless Tobacco Never Used      Health Maintenance  Topic Date Due  . PAP SMEAR-Modifier  12/22/1985  . MAMMOGRAM  12/23/2014  . COLONOSCOPY  12/23/2014  . TETANUS/TDAP  12/08/2020  . INFLUENZA VACCINE  Completed  . HIV Screening  Completed      Assessment and Plan:  Last primary care [facility]):  02/16/19 Hematology:  02/17/19.  Next 02/07/19. Pain management:  03/07/19 Next [place]:  02/20/19 Follow up with hematology:  03/22/19   Adherence:  Takes medication regularly.  Patient reports rarely missing doses, and does not use a pill box.  She retrieves medications from their prescription bottles.     Bilateral pulmonary embolism:  CBC 02/02/19 - 02/03/19:  revealed stable platelets 321 > 272.  Patient reports clot around 2002 or 2003, and received Lovenox (2 weeks) and coumadin for 1 year.  Patient has low ferritin levels, and receives iron infusions every year.  Patient recently reported SOB, after which she reported to [facility], who then referred her to the ED for dx of PE.  She was given Eliquis payment assistance card in the hospital for her first 30 day supply.  She describes taking Eliquis 10 mg PO BID x 1 week followed by 5 mg PO BID.  Advised to go to ED with worsening SOB or active bleeding. Patient will take Xarelto 20 mg daily until [facility] processes payment assitance application for Eliquis.  HTN:  Not currently on antihypertensive medications.  [place] (10/28):  157/103.  Patient recently provided with a blood pressure machine from [facility], and was encouraged to notify clinic for blood pressures > 140/90.  She reports taking her BP 2 times daily when she remembers to measure.  Blood pressure increases when she has SOB.  Lowest reading 80/53 at night.  Highest reading 167/112 with SOB, pt reports feeling like 'crap' that day.  She records her BP, and reported the following readings:    11/3 10:50 PM 81/52   10/30 10:00 PM 86/54.   10/30 11 AM 80/53    10/29 11 AM 112/79   MDD:  Effexor, mirtazepine.  Patient reports feeling well on the following medications in spite of losing job earlier this year.  She reports no suicidal ideation.  Allergies:  Flonase PRN, benadryl PRN.  She reports taking benadryl when needed over flonase,  because she does not like the after taste following intranasal administration of flonase.  Chronic low back pain:  She is a patient of Dr. Holland Falling at [facility].  Patient reports pain 4-8/10 pain depending on activity for the day with medications.  She currently takes oxycodone 30 mg five times daily, as well as tizanidine.  GERD:  Famotidine 20 mg at night   Follow-up:  3 months due to recent history of pulmonary embolism to check on AC status.  Gerald Dexter, PharmD Pharmacy Resident  02/28/2019 11:35 AM

## 2019-03-01 ENCOUNTER — Other Ambulatory Visit: Payer: Self-pay

## 2019-03-09 ENCOUNTER — Encounter: Payer: Self-pay | Admitting: Oncology

## 2019-03-10 ENCOUNTER — Inpatient Hospital Stay: Payer: Self-pay | Attending: Oncology | Admitting: Oncology

## 2019-03-10 DIAGNOSIS — I2699 Other pulmonary embolism without acute cor pulmonale: Secondary | ICD-10-CM

## 2019-03-10 DIAGNOSIS — R911 Solitary pulmonary nodule: Secondary | ICD-10-CM

## 2019-03-10 DIAGNOSIS — D509 Iron deficiency anemia, unspecified: Secondary | ICD-10-CM

## 2019-03-11 NOTE — Progress Notes (Signed)
I connected with Tracey Perez on 03/11/19 at 11:45 AM EST by video enabled telemedicine visit and verified that I am speaking with the correct person using two identifiers.   I discussed the limitations, risks, security and privacy concerns of performing an evaluation and management service by telemedicine and the availability of in-person appointments. I also discussed with the patient that there may be a patient responsible charge related to this service. The patient expressed understanding and agreed to proceed.  Other persons participating in the visit and their role in the encounter:  none  Patient's location:  home Provider's location:  work  Risk analyst Complaint: Discuss results of hypercoagulable work-up  Diagnosis: History of recent pulmonary embolism and left lower extremity DVT back in 2003  History of present illness: patient is a 53 year old female with a past medical history significant for migraine, hypertension among other medical problems.  She had a prior history of left lower extremity DVT back in 2003 when she took Coumadin for a year and then stopped it.  That was an unprovoked episode and there was no precipitating factors such as prolonged air road travel, infections hospitalization or surgery.  Patient was doing well up until 2020 when she experienced exertional shortness of breath which lasted for a week and did not get better.  She went to the ER and underwent a CT chest which showed bilateral PE without right heart strain.  Left Lower extremity Doppler was also done given her symptoms of swelling and was negative for DVT.  Patient was discharged on Eliquis.  Patient does not have any insurance and is currently getting Eliquis through [place] [facility].  She reports some itching since she started Eliquis.  No history of any pregnancy losses.  No family history of DVT or PE.  She is currently not on any home oxygen  Hypercoagulable work-up did not reveal any evidence of  prothrombin gene mutation, factor V Leiden mutation.  Antiphospholipid antibody syndrome test was negative.  Protein C, protein S, Antithrombin III levels were normal.  Interval history patient is currently on Eliquis and reports tolerating it well without any significant side effects.  Reports that her shortness of breath is improving.   Review of Systems  Constitutional: Positive for malaise/fatigue. Negative for chills, fever and weight loss.  HENT: Negative for congestion, ear discharge and nosebleeds.   Eyes: Negative for blurred vision.  Respiratory: Negative for cough, hemoptysis, sputum production, shortness of breath and wheezing.   Cardiovascular: Negative for chest pain, palpitations, orthopnea and claudication.  Gastrointestinal: Negative for abdominal pain, blood in stool, constipation, diarrhea, heartburn, melena, nausea and vomiting.  Genitourinary: Negative for dysuria, flank pain, frequency, hematuria and urgency.  Musculoskeletal: Negative for back pain, joint pain and myalgias.  Skin: Negative for rash.  Neurological: Negative for dizziness, tingling, focal weakness, seizures, weakness and headaches.  Endo/Heme/Allergies: Does not bruise/bleed easily.  Psychiatric/Behavioral: Negative for depression and suicidal ideas. The patient does not have insomnia.     Allergies  Allergen Reactions  . Morphine And Related Hives  . Nsaids Other (See Comments)    Affects platelet count  . Sulfa Antibiotics Shortness Of Breath  . Tape Other (See Comments)    bruises skin  . Verapamil Swelling  . Other Itching    Pt states she is allergic to the plastic that IV bags are made of. Reaction causes itching relieved by Benadryl.   . Acetazolamide Other (See Comments)    Acid reflux   . Seroquel [Quetiapine Fumarate]  Other (See Comments)    Muscle cramps    Past Medical History:  Diagnosis Date  . DVT (deep vein thrombosis) in pregnancy     Past Surgical History:  Procedure  Laterality Date  . ABDOMINAL HYSTERECTOMY    . TONSILLECTOMY      Social History   Socioeconomic History  . Marital status: Divorced    Spouse name: Not on file  . Number of children: Not on file  . Years of education: Not on file  . Highest education level: Not on file  Occupational History  . Not on file  Social Needs  . Financial resource strain: Not on file  . Food insecurity    Worry: Not on file    Inability: Not on file  . Transportation needs    Medical: Not on file    Non-medical: Not on file  Tobacco Use  . Smoking status: Former Smoker    Packs/day: 1.00    Years: 25.00    Pack years: 25.00    Types: Cigarettes    Quit date: 12/17/2011    Years since quitting: 7.2  . Smokeless tobacco: Never Used  Substance and Sexual Activity  . Alcohol use: Never    Frequency: Never  . Drug use: Not on file  . Sexual activity: Not on file  Lifestyle  . Physical activity    Days per week: Not on file    Minutes per session: Not on file  . Stress: Not on file  Relationships  . Social Herbalist on phone: Not on file    Gets together: Not on file    Attends religious service: Not on file    Active member of club or organization: Not on file    Attends meetings of clubs or organizations: Not on file    Relationship status: Not on file  . Intimate partner violence    Fear of current or ex partner: Not on file    Emotionally abused: Not on file    Physically abused: Not on file    Forced sexual activity: Not on file  Other Topics Concern  . Not on file  Social History Narrative  . Not on file    Family History  Problem Relation Age of Onset  . Non-Hodgkin's lymphoma Mother   . Hypertension Father   . Depression Father   . Alcohol abuse Father   . Migraines Father   . Multiple sclerosis Sister   . Migraines Sister   . Migraines Brother   . Thyroid disease Brother   . Migraines Brother   . Thyroid disease Brother   . Thyroid disease Brother   .  Thyroid disease Brother      Current Outpatient Medications:  .  apixaban (ELIQUIS) 5 MG TABS tablet, Two tabs po twice a day for one week then one tab po twice a day afterwards, Disp: 60 tablet, Rfl: 5 .  Blood Pressure Monitoring (BLOOD PRESSURE KIT) KIT, 1 kit by Does not apply route 2 (two) times daily., Disp: 1 kit, Rfl: 0 .  diphenhydrAMINE (BENADRYL) 25 mg capsule, Take 75 mg by mouth at bedtime as needed for allergies or sleep. Patient taking 50 mg QHS as needed., Disp: , Rfl:  .  fluticasone (FLONASE) 50 MCG/ACT nasal spray, Place 1 spray into both nostrils daily., Disp: 16 g, Rfl: 2 .  mirtazapine (REMERON) 30 MG tablet, Take 15 mg by mouth daily., Disp: , Rfl:  .  oxycodone (ROXICODONE)  30 MG immediate release tablet, Take 30 mg by mouth 5 (five) times daily., Disp: , Rfl:  .  rivaroxaban (XARELTO) 20 MG TABS tablet, Take 1 tablet (20 mg total) by mouth daily with supper., Disp: 30 tablet, Rfl: 1 .  tiZANidine (ZANAFLEX) 4 MG tablet, Take 4 mg by mouth 3 (three) times daily as needed for muscle spasms. Takes 0.5 to 1 tablet once daily at night PRN, Disp: , Rfl:  .  venlafaxine XR (EFFEXOR-XR) 150 MG 24 hr capsule, Take 300 mg by mouth daily., Disp: , Rfl:  .  zolpidem (AMBIEN) 10 MG tablet, Take 10 mg by mouth at bedtime as needed for sleep., Disp: , Rfl:   No results found.  No images are attached to the encounter.   CMP Latest Ref Rng & Units 02/03/2019  Glucose 70 - 99 mg/dL 115(H)  BUN 6 - 20 mg/dL 13  Creatinine 0.44 - 1.00 mg/dL 0.90  Sodium 135 - 145 mmol/L 143  Potassium 3.5 - 5.1 mmol/L 3.7  Chloride 98 - 111 mmol/L 110  CO2 22 - 32 mmol/L 24  Calcium 8.9 - 10.3 mg/dL 8.9   CBC Latest Ref Rng & Units 02/03/2019  WBC 4.0 - 10.5 K/uL 5.7  Hemoglobin 12.0 - 15.0 g/dL 11.6(L)  Hematocrit 36.0 - 46.0 % 34.6(L)  Platelets 150 - 400 K/uL 272     Observation/objective: Appears in no acute distress on video visit today.  Breathing is nonlabored  Assessment and  plan: Patient is a 53 year old female with prior history of left lower extremity DVT in 2003 and recent diagnosis of bilateral pulmonary embolism  Discussed with the patient that her hypercoagulable work-up was unremarkable.  Given that she had a prior history of DVT in 2003 and now a second episode of unprovoked bilateral PE, I would recommend that she should remain on lifelong anticoagulation.  She is currently tolerating Eliquis well and will continue that.  Also discussed the results of the CT angio chest from October 2020 which showed a 1.6 cm right thyroid nodule for which an ultrasound was recommended.  Patient would like to get this done with her primary care doctor and will talk to her more about it.  I will also forward my note from today so that her primary care provider is aware about the need for the ultrasound.  Patient had a 4 mm pleural-based nodule in the right upper lobe.  She is an ex-smoker.  I would like to repeat a CT scan in October 2021 and see her thereafter  Patient also has a history of iron deficiency anemia.  Recent iron studies from October 2020 did not reveal any evidence of iron deficiency.  I will repeat another set of labs in April 2021  Follow-up instructions: Labs in April 2021 and see Dr. Janese Banks in October 2021 with CT scan prior I discussed the assessment and treatment plan with the patient. The patient was provided an opportunity to ask questions and all were answered. The patient agreed with the plan and demonstrated an understanding of the instructions.   The patient was advised to call back or seek an in-person evaluation if the symptoms worsen or if the condition fails to improve as anticipated.    Visit Diagnosis: 1. Lung nodule   2. Iron deficiency anemia, unspecified iron deficiency anemia type   3. Bilateral pulmonary embolism (HCC)     Dr. Randa Evens, MD, MPH [facility] Pager- [phone] 03/11/2019 10:25 AM

## 2019-03-22 ENCOUNTER — Other Ambulatory Visit: Payer: Self-pay

## 2019-03-22 ENCOUNTER — Encounter: Payer: Self-pay | Admitting: Gerontology

## 2019-03-22 ENCOUNTER — Ambulatory Visit: Payer: Self-pay | Admitting: Gerontology

## 2019-03-22 VITALS — BP 143/92 | HR 90 | Ht 63.0 in | Wt 169.9 lb

## 2019-03-22 DIAGNOSIS — I2699 Other pulmonary embolism without acute cor pulmonale: Secondary | ICD-10-CM

## 2019-03-22 DIAGNOSIS — I10 Essential (primary) hypertension: Secondary | ICD-10-CM | POA: Insufficient documentation

## 2019-03-22 DIAGNOSIS — E041 Nontoxic single thyroid nodule: Secondary | ICD-10-CM | POA: Insufficient documentation

## 2019-03-22 DIAGNOSIS — R7303 Prediabetes: Secondary | ICD-10-CM | POA: Insufficient documentation

## 2019-03-22 MED ORDER — LISINOPRIL 5 MG PO TABS: 5.0000 mg | ORAL_TABLET | Freq: Every day | ORAL | 0 refills | Status: DC

## 2019-03-22 MED ORDER — RIVAROXABAN 20 MG PO TABS: 20.0000 mg | ORAL_TABLET | Freq: Every day | ORAL | 4 refills | Status: DC

## 2019-03-22 NOTE — Progress Notes (Signed)
Established Patient Office Visit  Subjective:  Patient ID: Tracey Perez, female    DOB: [birth year]  Age: 53 y.o. MRN: [id number]  CC:  Chief Complaint  Patient presents with  . Hypertension    HPI Tracey Perez presents for follow up elevated blood pressure and lab review.  She forgot to bring her blood pressure log to her follow-up visit, but her blood pressure was 143/92.  She states that she is working on her diet. Her hemoglobin A1c done on 02/16/2019 was 5.8% .  She continues to take Xarelto 20 mg daily for pulmonary embolism, she reports intermittent bruises, but denies hematuria, hematochezia active bleeding.  She states that her shortness of breath has improved significantly . She was seen by Dr Olena Mater on 02/17/2019 for hypercoagulable work up for bilateral pulmonary embolism. On a follow up visit with Dr Olena Mater on 03/10/2019, she recommends that she will remain on lifelong anticoagulation. Her CT angio chest from October 2020, showed a 1.6 cm right thyroid nodule, ultrasound was recommended. She denies dysphagia, cold/heat intolerance ,constipation and fatigue. She will follow-up with Dr. Janese Banks with regards to 4 mm pleural-based nodule in the posterior right upper lobe on sequence 6 image 23.  She states that she is doing well and denies no further complaints.  Past Medical History:  Diagnosis Date  . DVT (deep vein thrombosis) in pregnancy     Past Surgical History:  Procedure Laterality Date  . ABDOMINAL HYSTERECTOMY    . TONSILLECTOMY      Family History  Problem Relation Age of Onset  . Non-Hodgkin's lymphoma Mother   . Hypertension Father   . Depression Father   . Alcohol abuse Father   . Migraines Father   . Multiple sclerosis Sister   . Migraines Sister   . Migraines Brother   . Thyroid disease Brother   . Migraines Brother   . Thyroid disease Brother   . Thyroid disease Brother   . Thyroid disease Brother     Social History   Socioeconomic History   . Marital status: Divorced    Spouse name: Not on file  . Number of children: Not on file  . Years of education: Not on file  . Highest education level: Not on file  Occupational History  . Not on file  Social Needs  . Financial resource strain: Not on file  . Food insecurity    Worry: Not on file    Inability: Not on file  . Transportation needs    Medical: Not on file    Non-medical: Not on file  Tobacco Use  . Smoking status: Former Smoker    Packs/day: 1.00    Years: 25.00    Pack years: 25.00    Types: Cigarettes    Quit date: 12/17/2011    Years since quitting: 7.2  . Smokeless tobacco: Never Used  Substance and Sexual Activity  . Alcohol use: Never    Frequency: Never  . Drug use: Not on file  . Sexual activity: Not on file  Lifestyle  . Physical activity    Days per week: Not on file    Minutes per session: Not on file  . Stress: Not on file  Relationships  . Social Herbalist on phone: Not on file    Gets together: Not on file    Attends religious service: Not on file    Active member of club or organization: Not on file  Attends meetings of clubs or organizations: Not on file    Relationship status: Not on file  . Intimate partner violence    Fear of current or ex partner: Not on file    Emotionally abused: Not on file    Physically abused: Not on file    Forced sexual activity: Not on file  Other Topics Concern  . Not on file  Social History Narrative  . Not on file    Outpatient Medications Prior to Visit  Medication Sig Dispense Refill  . diphenhydrAMINE (BENADRYL) 25 mg capsule Take 75 mg by mouth at bedtime as needed for allergies or sleep. Patient taking 50 mg QHS as needed.    . fluticasone (FLONASE) 50 MCG/ACT nasal spray Place 1 spray into both nostrils daily. 16 g 2  . mirtazapine (REMERON) 30 MG tablet Take 15 mg by mouth daily.    Marland Kitchen oxycodone (ROXICODONE) 30 MG immediate release tablet Take 30 mg by mouth 5 (five) times  daily.    Marland Kitchen tiZANidine (ZANAFLEX) 4 MG tablet Take 4 mg by mouth 3 (three) times daily as needed for muscle spasms. Takes 0.5 to 1 tablet once daily at night PRN    . venlafaxine XR (EFFEXOR-XR) 150 MG 24 hr capsule Take 300 mg by mouth daily.    Marland Kitchen zolpidem (AMBIEN) 10 MG tablet Take 10 mg by mouth at bedtime as needed for sleep.    . rivaroxaban (XARELTO) 20 MG TABS tablet Take 1 tablet (20 mg total) by mouth daily with supper. 30 tablet 1  . apixaban (ELIQUIS) 5 MG TABS tablet Two tabs po twice a day for one week then one tab po twice a day afterwards (Patient not taking: Reported on 03/22/2019) 60 tablet 5  . Blood Pressure Monitoring (BLOOD PRESSURE KIT) KIT 1 kit by Does not apply route 2 (two) times daily. 1 kit 0   No facility-administered medications prior to visit.     Allergies  Allergen Reactions  . Morphine And Related Hives  . Nsaids Other (See Comments)    Affects platelet count  . Sulfa Antibiotics Shortness Of Breath  . Tape Other (See Comments)    bruises skin  . Verapamil Swelling  . Other Itching    Pt states she is allergic to the plastic that IV bags are made of. Reaction causes itching relieved by Benadryl.   . Acetazolamide Other (See Comments)    Acid reflux   . Seroquel [Quetiapine Fumarate] Other (See Comments)    Muscle cramps    ROS Review of Systems  Constitutional: Negative.   Respiratory: Negative for shortness of breath.   Cardiovascular: Negative.  Negative for chest pain.  Gastrointestinal: Negative for blood in stool.  Endocrine: Negative.   Genitourinary: Negative for hematuria.  Skin: Negative.   Hematological: Bruises/bleeds easily.  Psychiatric/Behavioral: Negative.       Objective:    Physical Exam  Constitutional: She is oriented to person, place, and time. She appears well-developed.  HENT:  Head: Normocephalic and atraumatic.  Eyes: Pupils are equal, round, and reactive to light. EOM are normal.  Neck: Normal range of motion.  No tracheal deviation present.  Cardiovascular: Normal rate and regular rhythm.  Pulmonary/Chest: Effort normal and breath sounds normal.  Neurological: She is alert and oriented to person, place, and time.  Skin: Skin is warm and dry.  Psychiatric: She has a normal mood and affect. Her behavior is normal. Judgment and thought content normal.    BP (!) 143/92 (  BP Location: Left Arm, Patient Position: Sitting)   Pulse 90   Ht '5\' 3"'$  (1.6 m)   Wt 169 lb 14.4 oz (77.1 kg)   SpO2 94%   BMI 30.10 kg/m  Wt Readings from Last 3 Encounters:  03/22/19 169 lb 14.4 oz (77.1 kg)  02/17/19 176 lb 6.4 oz (80 kg)  02/16/19 171 lb (77.6 kg)   She was encouraged to continue on her weight loss regimen.  Health Maintenance Due  Topic Date Due  . PAP SMEAR-Modifier  12/22/1985  . MAMMOGRAM  12/23/2014  . COLONOSCOPY  12/23/2014    There are no preventive care reminders to display for this patient.  Lab Results  Component Value Date   TSH 1.040 02/16/2019   Lab Results  Component Value Date   WBC 5.7 02/03/2019   HGB 11.6 (L) 02/03/2019   HCT 34.6 (L) 02/03/2019   MCV 82.4 02/03/2019   PLT 272 02/03/2019   Lab Results  Component Value Date   NA 143 02/03/2019   K 3.7 02/03/2019   CO2 24 02/03/2019   GLUCOSE 115 (H) 02/03/2019   BUN 13 02/03/2019   CREATININE 0.90 02/03/2019   CALCIUM 8.9 02/03/2019   ANIONGAP 9 02/03/2019   No results found for: CHOL No results found for: HDL No results found for: LDLCALC No results found for: TRIG No results found for: CHOLHDL Lab Results  Component Value Date   HGBA1C 5.8 (H) 02/16/2019      Assessment & Plan:   1. Prediabetes -High hemoglobin A1c was 5.8%, she declined medication therapy and states that she will walk on her diet and exercise as tolerated.  She was encouraged to continue on low carbohydrate/no concentrated sweet diet.  We will recheck - HgB A1c; Future. - Lipid panel; Future  2. Thyroid nodule -She will be referred  to endocrinology for evaluation of the thyroid nodule. - Ambulatory referral to Endocrinology  3. Essential hypertension -Her blood pressure was 143/97 which is stage II hypertension, she will start lisinopril 5 mg daily and was educated on the side effects.  She was advised to notify clinic.  She was advised to check her blood pressure daily, record and bring the log to office visit.  She was encouraged to continue on DASH diet, and exercise as tolerated.  Her goal blood pressure should be less than 140/90 and normal is less than 120/90. She is allergic to Seroquel, Pharmacy advised to prescribe Lisinopril and monitor. - lisinopril (ZESTRIL) 5 MG tablet; Take 1 tablet (5 mg total) by mouth daily.  Dispense: 30 tablet; Refill: 0  4. Bilateral pulmonary embolism ([place]) -She will continue on 20 mg Xarelto, and was advised to go to the ED for active bleeding.  She will follow-up with Dr. Janese Banks with regards to the lung nodule. - rivaroxaban (XARELTO) 20 MG TABS tablet; Take 1 tablet (20 mg total) by mouth daily with supper.  Dispense: 30 tablet; Refill: 4     Follow-up: Return in about 20 days (around 04/11/2019), or if symptoms worsen or fail to improve.    Ibrahim Mcpheeters Jerold Coombe, NP

## 2019-03-22 NOTE — Patient Instructions (Signed)
Carbohydrate Counting for Diabetes Mellitus, Adult  Carbohydrate counting is a method of keeping track of how many carbohydrates you eat. Eating carbohydrates naturally increases the amount of sugar (glucose) in the blood. Counting how many carbohydrates you eat helps keep your blood glucose within normal limits, which helps you manage your diabetes (diabetes mellitus). It is important to know how many carbohydrates you can safely have in each meal. This is different for every person. A diet and nutrition specialist (registered dietitian) can help you make a meal plan and calculate how many carbohydrates you should have at each meal and snack. Carbohydrates are found in the following foods:  Grains, such as breads and cereals.  Dried beans and soy products.  Starchy vegetables, such as potatoes, peas, and corn.  Fruit and fruit juices.  Milk and yogurt.  Sweets and snack foods, such as cake, cookies, candy, chips, and soft drinks. How do I count carbohydrates? There are two ways to count carbohydrates in food. You can use either of the methods or a combination of both. Reading "Nutrition Facts" on packaged food The "Nutrition Facts" list is included on the labels of almost all packaged foods and beverages in the [place] It includes:  The serving size.  Information about nutrients in each serving, including the grams (g) of carbohydrate per serving. To use the "Nutrition Facts":  Decide how many servings you will have.  Multiply the number of servings by the number of carbohydrates per serving.  The resulting number is the total amount of carbohydrates that you will be having. Learning standard serving sizes of other foods When you eat carbohydrate foods that are not packaged or do not include "Nutrition Facts" on the label, you need to measure the servings in order to count the amount of carbohydrates:  Measure the foods that you will eat with a food scale or measuring cup, if needed.   Decide how many standard-size servings you will eat.  Multiply the number of servings by 15. Most carbohydrate-rich foods have about 15 g of carbohydrates per serving. ? For example, if you eat 8 oz (170 g) of strawberries, you will have eaten 2 servings and 30 g of carbohydrates (2 servings x 15 g = 30 g).  For foods that have more than one food mixed, such as soups and casseroles, you must count the carbohydrates in each food that is included. The following list contains standard serving sizes of common carbohydrate-rich foods. Each of these servings has about 15 g of carbohydrates:   hamburger bun or  English muffin.   oz (15 mL) syrup.   oz (14 g) jelly.  1 slice of bread.  1 six-inch tortilla.  3 oz (85 g) cooked rice or pasta.  4 oz (113 g) cooked dried beans.  4 oz (113 g) starchy vegetable, such as peas, corn, or potatoes.  4 oz (113 g) hot cereal.  4 oz (113 g) mashed potatoes or  of a large baked potato.  4 oz (113 g) canned or frozen fruit.  4 oz (120 mL) fruit juice.  4-6 crackers.  6 chicken nuggets.  6 oz (170 g) unsweetened dry cereal.  6 oz (170 g) plain fat-free yogurt or yogurt sweetened with artificial sweeteners.  8 oz (240 mL) milk.  8 oz (170 g) fresh fruit or one small piece of fruit.  24 oz (680 g) popped popcorn. Example of carbohydrate counting Sample meal  3 oz (85 g) chicken breast.  6 oz (170 g)   brown rice.  4 oz (113 g) corn.  8 oz (240 mL) milk.  8 oz (170 g) strawberries with sugar-free whipped topping. Carbohydrate calculation 1. Identify the foods that contain carbohydrates: ? Rice. ? Corn. ? Milk. ? Strawberries. 2. Calculate how many servings you have of each food: ? 2 servings rice. ? 1 serving corn. ? 1 serving milk. ? 1 serving strawberries. 3. Multiply each number of servings by 15 g: ? 2 servings rice x 15 g = 30 g. ? 1 serving corn x 15 g = 15 g. ? 1 serving milk x 15 g = 15 g. ? 1 serving  strawberries x 15 g = 15 g. 4. Add together all of the amounts to find the total grams of carbohydrates eaten: ? 30 g + 15 g + 15 g + 15 g = 75 g of carbohydrates total. Summary  Carbohydrate counting is a method of keeping track of how many carbohydrates you eat.  Eating carbohydrates naturally increases the amount of sugar (glucose) in the blood.  Counting how many carbohydrates you eat helps keep your blood glucose within normal limits, which helps you manage your diabetes.  A diet and nutrition specialist (registered dietitian) can help you make a meal plan and calculate how many carbohydrates you should have at each meal and snack. This information is not intended to replace advice given to you by your health care provider. Make sure you discuss any questions you have with your health care provider. Document Released: 04/07/2005 Document Revised: 10/30/2016 Document Reviewed: 09/19/2015 Elsevier Patient Education  2020 Elsevier Inc. DASH Eating Plan DASH stands for "Dietary Approaches to Stop Hypertension." The DASH eating plan is a healthy eating plan that has been shown to reduce high blood pressure (hypertension). It may also reduce your risk for type 2 diabetes, heart disease, and stroke. The DASH eating plan may also help with weight loss. What are tips for following this plan?  General guidelines  Avoid eating more than 2,300 mg (milligrams) of salt (sodium) a day. If you have hypertension, you may need to reduce your sodium intake to 1,500 mg a day.  Limit alcohol intake to no more than 1 drink a day for nonpregnant women and 2 drinks a day for men. One drink equals 12 oz of beer, 5 oz of wine, or 1 oz of hard liquor.  Work with your health care provider to maintain a healthy body weight or to lose weight. Ask what an ideal weight is for you.  Get at least 30 minutes of exercise that causes your heart to beat faster (aerobic exercise) most days of the week. Activities may  include walking, swimming, or biking.  Work with your health care provider or diet and nutrition specialist (dietitian) to adjust your eating plan to your individual calorie needs. Reading food labels   Check food labels for the amount of sodium per serving. Choose foods with less than 5 percent of the Daily Value of sodium. Generally, foods with less than 300 mg of sodium per serving fit into this eating plan.  To find whole grains, look for the word "whole" as the first word in the ingredient list. Shopping  Buy products labeled as "low-sodium" or "no salt added."  Buy fresh foods. Avoid canned foods and premade or frozen meals. Cooking  Avoid adding salt when cooking. Use salt-free seasonings or herbs instead of table salt or sea salt. Check with your health care provider or pharmacist before using salt substitutes.    Do not fry foods. Cook foods using healthy methods such as baking, boiling, grilling, and broiling instead.  Cook with heart-healthy oils, such as olive, canola, soybean, or sunflower oil. Meal planning  Eat a balanced diet that includes: ? 5 or more servings of fruits and vegetables each day. At each meal, try to fill half of your plate with fruits and vegetables. ? Up to 6-8 servings of whole grains each day. ? Less than 6 oz of lean meat, poultry, or fish each day. A 3-oz serving of meat is about the same size as a deck of cards. One egg equals 1 oz. ? 2 servings of low-fat dairy each day. ? A serving of nuts, seeds, or beans 5 times each week. ? Heart-healthy fats. Healthy fats called Omega-3 fatty acids are found in foods such as flaxseeds and coldwater fish, like sardines, salmon, and mackerel.  Limit how much you eat of the following: ? Canned or prepackaged foods. ? Food that is high in trans fat, such as fried foods. ? Food that is high in saturated fat, such as fatty meat. ? Sweets, desserts, sugary drinks, and other foods with added sugar. ? Full-fat  dairy products.  Do not salt foods before eating.  Try to eat at least 2 vegetarian meals each week.  Eat more home-cooked food and less restaurant, buffet, and fast food.  When eating at a restaurant, ask that your food be prepared with less salt or no salt, if possible. What foods are recommended? The items listed may not be a complete list. Talk with your dietitian about what dietary choices are best for you. Grains Whole-grain or whole-wheat bread. Whole-grain or whole-wheat pasta. Brown rice. Oatmeal. Quinoa. Bulgur. Whole-grain and low-sodium cereals. Pita bread. Low-fat, low-sodium crackers. Whole-wheat flour tortillas. Vegetables Fresh or frozen vegetables (raw, steamed, roasted, or grilled). Low-sodium or reduced-sodium tomato and vegetable juice. Low-sodium or reduced-sodium tomato sauce and tomato paste. Low-sodium or reduced-sodium canned vegetables. Fruits All fresh, dried, or frozen fruit. Canned fruit in natural juice (without added sugar). Meat and other protein foods Skinless chicken or turkey. Ground chicken or turkey. Pork with fat trimmed off. Fish and seafood. Egg whites. Dried beans, peas, or lentils. Unsalted nuts, nut butters, and seeds. Unsalted canned beans. Lean cuts of beef with fat trimmed off. Low-sodium, lean deli meat. Dairy Low-fat (1%) or fat-free (skim) milk. Fat-free, low-fat, or reduced-fat cheeses. Nonfat, low-sodium ricotta or cottage cheese. Low-fat or nonfat yogurt. Low-fat, low-sodium cheese. Fats and oils Soft margarine without trans fats. Vegetable oil. Low-fat, reduced-fat, or light mayonnaise and salad dressings (reduced-sodium). Canola, safflower, olive, soybean, and sunflower oils. Avocado. Seasoning and other foods Herbs. Spices. Seasoning mixes without salt. Unsalted popcorn and pretzels. Fat-free sweets. What foods are not recommended? The items listed may not be a complete list. Talk with your dietitian about what dietary choices are best  for you. Grains Baked goods made with fat, such as croissants, muffins, or some breads. Dry pasta or rice meal packs. Vegetables Creamed or fried vegetables. Vegetables in a cheese sauce. Regular canned vegetables (not low-sodium or reduced-sodium). Regular canned tomato sauce and paste (not low-sodium or reduced-sodium). Regular tomato and vegetable juice (not low-sodium or reduced-sodium). Pickles. Olives. Fruits Canned fruit in a light or heavy syrup. Fried fruit. Fruit in cream or butter sauce. Meat and other protein foods Fatty cuts of meat. Ribs. Fried meat. Bacon. Sausage. Bologna and other processed lunch meats. Salami. Fatback. Hotdogs. Bratwurst. Salted nuts and seeds. Canned beans with   added salt. Canned or smoked fish. Whole eggs or egg yolks. Chicken or turkey with skin. Dairy Whole or 2% milk, cream, and half-and-half. Whole or full-fat cream cheese. Whole-fat or sweetened yogurt. Full-fat cheese. Nondairy creamers. Whipped toppings. Processed cheese and cheese spreads. Fats and oils Butter. Stick margarine. Lard. Shortening. Ghee. Bacon fat. Tropical oils, such as coconut, palm kernel, or palm oil. Seasoning and other foods Salted popcorn and pretzels. Onion salt, garlic salt, seasoned salt, table salt, and sea salt. Worcestershire sauce. Tartar sauce. Barbecue sauce. Teriyaki sauce. Soy sauce, including reduced-sodium. Steak sauce. Canned and packaged gravies. Fish sauce. Oyster sauce. Cocktail sauce. Horseradish that you find on the shelf. Ketchup. Mustard. Meat flavorings and tenderizers. Bouillon cubes. Hot sauce and Tabasco sauce. Premade or packaged marinades. Premade or packaged taco seasonings. Relishes. Regular salad dressings. Where to find more information:  National Heart, Lung, and Blood Institute: www.nhlbi.nih.gov  American Heart Association: www.heart.org Summary  The DASH eating plan is a healthy eating plan that has been shown to reduce high blood pressure  (hypertension). It may also reduce your risk for type 2 diabetes, heart disease, and stroke.  With the DASH eating plan, you should limit salt (sodium) intake to 2,300 mg a day. If you have hypertension, you may need to reduce your sodium intake to 1,500 mg a day.  When on the DASH eating plan, aim to eat more fresh fruits and vegetables, whole grains, lean proteins, low-fat dairy, and heart-healthy fats.  Work with your health care provider or diet and nutrition specialist (dietitian) to adjust your eating plan to your individual calorie needs. This information is not intended to replace advice given to you by your health care provider. Make sure you discuss any questions you have with your health care provider. Document Released: 03/27/2011 Document Revised: 03/20/2017 Document Reviewed: 03/31/2016 Elsevier Patient Education  2020 Elsevier Inc.  

## 2019-03-24 ENCOUNTER — Other Ambulatory Visit: Payer: Self-pay | Admitting: Gerontology

## 2019-03-24 DIAGNOSIS — I10 Essential (primary) hypertension: Secondary | ICD-10-CM

## 2019-03-24 MED ORDER — HYDRALAZINE HCL 25 MG PO TABS: 12.5000 mg | ORAL_TABLET | Freq: Every day | ORAL | 0 refills | Status: DC

## 2019-03-29 ENCOUNTER — Telehealth: Payer: Self-pay | Admitting: Pharmacy Technician

## 2019-03-29 NOTE — Telephone Encounter (Signed)
Spoke with [place] at [employer].  Indicated that PAP application sent to processing team to reconsider.  Cannot provide a time lime of how long it will take to obtain a decision.  Asked to speak to supervisor.  Was transferred and remained on hold for 30 minutes and was disconnected. Giving to Elmer Picker to follow-up.  Made patient and [facility] aware.  [facility]

## 2019-04-07 ENCOUNTER — Encounter: Payer: Self-pay | Admitting: "Endocrinology

## 2019-04-07 ENCOUNTER — Other Ambulatory Visit: Payer: Self-pay

## 2019-04-07 ENCOUNTER — Ambulatory Visit (INDEPENDENT_AMBULATORY_CARE_PROVIDER_SITE_OTHER): Payer: Self-pay | Admitting: "Endocrinology

## 2019-04-07 VITALS — BP 135/86 | HR 92 | Ht 63.0 in | Wt 168.0 lb

## 2019-04-07 DIAGNOSIS — E049 Nontoxic goiter, unspecified: Secondary | ICD-10-CM

## 2019-04-07 NOTE — Progress Notes (Signed)
Endocrinology Consult Note                                            04/07/2019, 6:36 PM   Subjective:    Patient ID: Tracey Perez, female    DOB: 1966-02-08, PCP Langston Reusing, NP   Past Medical History:  Diagnosis Date  . DVT (deep vein thrombosis) in pregnancy    Past Surgical History:  Procedure Laterality Date  . ABDOMINAL HYSTERECTOMY    . TONSILLECTOMY     Social History   Socioeconomic History  . Marital status: Divorced    Spouse name: Not on file  . Number of children: Not on file  . Years of education: Not on file  . Highest education level: Not on file  Occupational History  . Not on file  Tobacco Use  . Smoking status: Former Smoker    Packs/day: 1.00    Years: 25.00    Pack years: 25.00    Types: Cigarettes    Quit date: 12/17/2011    Years since quitting: 7.3  . Smokeless tobacco: Never Used  Substance and Sexual Activity  . Alcohol use: Never  . Drug use: Not on file  . Sexual activity: Not on file  Other Topics Concern  . Not on file  Social History Narrative  . Not on file   Social Determinants of Health   Financial Resource Strain:   . Difficulty of Paying Living Expenses: Not on file  Food Insecurity:   . Worried About Charity fundraiser in the Last Year: Not on file  . Ran Out of Food in the Last Year: Not on file  Transportation Needs:   . Lack of Transportation (Medical): Not on file  . Lack of Transportation (Non-Medical): Not on file  Physical Activity:   . Days of Exercise per Week: Not on file  . Minutes of Exercise per Session: Not on file  Stress:   . Feeling of Stress : Not on file  Social Connections:   . Frequency of Communication with Friends and Family: Not on file  . Frequency of Social Gatherings with Friends and Family: Not on file  . Attends Religious Services: Not on file  . Active Member of Clubs or Organizations: Not on file  . Attends Archivist Meetings: Not on file  . Marital  Status: Not on file   Family History  Problem Relation Age of Onset  . Non-Hodgkin's lymphoma Mother   . Hypertension Father   . Depression Father   . Alcohol abuse Father   . Migraines Father   . Multiple sclerosis Sister   . Migraines Sister   . Migraines Brother   . Thyroid disease Brother   . Migraines Brother   . Thyroid disease Brother   . Thyroid disease Brother   . Thyroid disease Brother    Outpatient Encounter Medications as of 04/07/2019  Medication Sig  . apixaban (ELIQUIS) 5 MG TABS tablet Two tabs po twice a day for one week then one tab po twice a day afterwards (Patient not taking: Reported on 03/22/2019)  . Blood Pressure Monitoring (BLOOD PRESSURE KIT) KIT 1 kit by Does not apply route 2 (two) times daily.  . diphenhydrAMINE (BENADRYL) 25 mg capsule Take 75 mg by mouth at bedtime as needed for allergies or sleep. Patient  taking 50 mg QHS as needed.  . fluticasone (FLONASE) 50 MCG/ACT nasal spray Place 1 spray into both nostrils daily.  . hydrALAZINE (APRESOLINE) 25 MG tablet Take 0.5 tablets (12.5 mg total) by mouth daily.  . mirtazapine (REMERON) 30 MG tablet Take 15 mg by mouth daily.  Marland Kitchen oxycodone (ROXICODONE) 30 MG immediate release tablet Take 30 mg by mouth 5 (five) times daily.  . rivaroxaban (XARELTO) 20 MG TABS tablet Take 1 tablet (20 mg total) by mouth daily with supper.  Marland Kitchen tiZANidine (ZANAFLEX) 4 MG tablet Take 4 mg by mouth 3 (three) times daily as needed for muscle spasms. Takes 0.5 to 1 tablet once daily at night PRN  . venlafaxine XR (EFFEXOR-XR) 150 MG 24 hr capsule Take 300 mg by mouth daily.  Marland Kitchen zolpidem (AMBIEN) 10 MG tablet Take 10 mg by mouth at bedtime as needed for sleep.   No facility-administered encounter medications on file as of 04/07/2019.   ALLERGIES: Allergies  Allergen Reactions  . Morphine And Related Hives  . Nsaids Other (See Comments)    Affects platelet count  . Sulfa Antibiotics Shortness Of Breath  . Tape Other (See  Comments)    bruises skin  . Verapamil Swelling  . Lisinopril Other (See Comments)    Burning throat, metallic taste  . Other Itching    Pt states she is allergic to the plastic that IV bags are made of. Reaction causes itching relieved by Benadryl.   . Acetazolamide Other (See Comments)    Acid reflux   . Seroquel [Quetiapine Fumarate] Other (See Comments)    Muscle cramps    VACCINATION STATUS: Immunization History  Administered Date(s) Administered  . Influenza,inj,Quad PF,6+ Mos 02/16/2019  . Influenza-Unspecified 01/25/2012, 01/13/2013, 01/05/2014, 01/29/2015, 02/07/2016  . Tdap 12/09/2010    HPI Tracey Perez is 53 y.o. female who presents today with a medical history as above. she is being seen in consultation for thyroid nodule requested by Langston Reusing, NP.  History is obtained directly from the patient.  She denies any prior history of thyroid dysfunction.  She was undergoing CT chest for history of pulmonary embolism when she was found to have a 1.6 cm nodule on the right lobe of her thyroid. Patient denies any dysphagia, shortness of breath, no voice change.  She has steady weight, denies palpitations, tremors, heat/cold intolerance.  She has some family history of thyroid dysfunction, however no family history of thyroid malignancy.  She is a former smoker quit in 2013. Her recent thyroid function tests were within normal limits.  Review of Systems  Constitutional: + Recent intentional weight loss,  no fatigue, no subjective hyperthermia, no subjective hypothermia Eyes: no blurry vision, no xerophthalmia ENT: no sore throat, no nodules palpated in throat, no dysphagia/odynophagia, no hoarseness Cardiovascular: no Chest Pain, no Shortness of Breath, no palpitations, no leg swelling Respiratory: no cough, no shortness of breath Gastrointestinal: no Nausea/Vomiting/Diarhhea Musculoskeletal: no muscle/joint aches Skin: no rashes Neurological: no tremors, no  numbness, no tingling, no dizziness Psychiatric: no depression, no anxiety  Objective:    BP 135/86   Pulse 92   Ht '5\' 3"'$  (1.6 m)   Wt 168 lb (76.2 kg)   BMI 29.76 kg/m   Wt Readings from Last 3 Encounters:  04/07/19 168 lb (76.2 kg)  03/22/19 169 lb 14.4 oz (77.1 kg)  02/17/19 176 lb 6.4 oz (80 kg)    Physical Exam  Constitutional:  Body mass index is 29.76 kg/m.,  not  in acute distress, normal state of mind Eyes: PERRLA, EOMI, no exophthalmos ENT: moist mucous membranes, + palpable nodular goiter,  no gross cervical lymphadenopathy Cardiovascular: normal precordial activity, Regular Rate and Rhythm, no Murmur/Rubs/Gallops Respiratory:  adequate breathing efforts, no gross chest deformity, Clear to auscultation bilaterally  Musculoskeletal: no gross deformities, strength intact in all four extremities Skin: moist, warm, no rashes Neurological: no tremor with outstretched hands, Deep tendon reflexes normal in bilateral lower extremities.  CMP ( most recent) CMP     Component Value Date/Time   NA 143 02/03/2019 0453   K 3.7 02/03/2019 0453   CL 110 02/03/2019 0453   CO2 24 02/03/2019 0453   GLUCOSE 115 (H) 02/03/2019 0453   BUN 13 02/03/2019 0453   CREATININE 0.90 02/03/2019 0453   CALCIUM 8.9 02/03/2019 0453   GFRNONAA >60 02/03/2019 0453   GFRAA >60 02/03/2019 0453     Diabetic Labs (most recent): Lab Results  Component Value Date   HGBA1C 5.8 (H) 02/16/2019     Lab Results  Component Value Date   TSH 1.040 02/16/2019           Assessment & Plan:   1. Nodular goiter  - Tracey Perez  is being seen at a kind request of [place], [place], NP. - I have reviewed her available thyroid records and clinically evaluated the patient. - Based on these reviews, she has euthyroid nodular goiter,  however,  there is not sufficient information to proceed with definitive treatment plan.  - she will need dedicated thyroid ultrasound to characterize size  and anatomy of her thyroid and thyroid incidentaloma. -This test will be scheduled to be done in the next several weeks at [facility] at radiology. -If she is found to have nodule with suspicious features, she will be considered for fine-needle aspiration biopsy. - I did not initiate any new prescriptions today. - she is advised to maintain close follow up with Iloabachie, Chioma E, NP for primary care needs.   - Time spent with the patient: 45 minutes, of which >50% was spent in obtaining information about her symptoms, reviewing her previous labs/studies,  evaluations, and treatments, counseling her about her nodular goiter, and developing a plan to confirm the diagnosis and long term treatment based on the latest standards of care/guidelines.    Tracey Perez participated in the discussions, expressed understanding, and voiced agreement with the above plans.  All questions were answered to her satisfaction. she is encouraged to contact clinic should she have any questions or concerns prior to her return visit.  Follow up plan: Return in about 5 weeks (around 05/12/2019) for Thyroid / Neck Ultrasound.   Glade Lloyd, MD [facility] [address] [place], [place] [address] Phone: [phone]  Fax: [phone]     04/07/2019, 6:36 PM  This note was partially dictated with voice recognition software. Similar sounding words can be transcribed inadequately or may not  be corrected upon review.

## 2019-04-11 ENCOUNTER — Other Ambulatory Visit: Payer: Self-pay

## 2019-04-11 ENCOUNTER — Ambulatory Visit: Payer: Self-pay | Admitting: Gerontology

## 2019-04-11 VITALS — BP 129/85

## 2019-04-11 DIAGNOSIS — I10 Essential (primary) hypertension: Secondary | ICD-10-CM

## 2019-04-11 DIAGNOSIS — R233 Spontaneous ecchymoses: Secondary | ICD-10-CM

## 2019-04-11 DIAGNOSIS — R238 Other skin changes: Secondary | ICD-10-CM | POA: Insufficient documentation

## 2019-04-11 MED ORDER — HYDRALAZINE HCL 25 MG PO TABS: 12.5000 mg | ORAL_TABLET | Freq: Every day | ORAL | 2 refills | Status: DC

## 2019-04-11 NOTE — Patient Instructions (Signed)

## 2019-04-11 NOTE — Progress Notes (Signed)
Established Patient Office Visit  Subjective:  Patient ID: Tracey Perez, female    DOB: Jul 05, 1965  Age: 53 y.o. MRN: [id number]  CC: No chief complaint on file. Patient consents to telephone visit, and 2 patient identifiers was used to identify patient.  HPI [name] presents for follow up of hypertension. She states that she's tolerating 12.5 mg hydralazine daily, and she denies light headedness. She checks her blood pressure daily at home and her SBP ranges between low 100's to mid 130's and DBP ranges between mid 70's to mid 80's. She reports that she continues on DASH diet and exercises as tolerated. She was happy that she restarted Eliquis on 04/08/2019 and states that she has not being experiencing intermittent chest discomfort. She reports mild intermittent bruises, but she denies hematuria, hematochezia, and active bleeding.  She was seen on 04/07/2019 by Dr. Jacqualyn Posey for nodular goiter.  Thyroid Ultrasound is scheduled for 04/18/2019 and she will follow-up on 05/12/2019 with Endocrinologist Dr Jacqualyn Posey. She denies dysphagia, and odynophagia.  She states that she is doing well, and denies further complaint.  Past Medical History:  Diagnosis Date  . DVT (deep vein thrombosis) in pregnancy     Past Surgical History:  Procedure Laterality Date  . ABDOMINAL HYSTERECTOMY    . TONSILLECTOMY      Family History  Problem Relation Age of Onset  . Non-Hodgkin's lymphoma Mother   . Hypertension Father   . Depression Father   . Alcohol abuse Father   . Migraines Father   . Multiple sclerosis Sister   . Migraines Sister   . Migraines Brother   . Thyroid disease Brother   . Migraines Brother   . Thyroid disease Brother   . Thyroid disease Brother   . Thyroid disease Brother     Social History   Socioeconomic History  . Marital status: Divorced    Spouse name: Not on file  . Number of children: Not on file  . Years of education: Not on file  . Highest  education level: Not on file  Occupational History  . Not on file  Tobacco Use  . Smoking status: Former Smoker    Packs/day: 1.00    Years: 25.00    Pack years: 25.00    Types: Cigarettes    Quit date: 12/17/2011    Years since quitting: 7.3  . Smokeless tobacco: Never Used  Substance and Sexual Activity  . Alcohol use: Never  . Drug use: Not on file  . Sexual activity: Not on file  Other Topics Concern  . Not on file  Social History Narrative  . Not on file   Social Determinants of Health   Financial Resource Strain:   . Difficulty of Paying Living Expenses: Not on file  Food Insecurity:   . Worried About Charity fundraiser in the Last Year: Not on file  . Ran Out of Food in the Last Year: Not on file  Transportation Needs:   . Lack of Transportation (Medical): Not on file  . Lack of Transportation (Non-Medical): Not on file  Physical Activity:   . Days of Exercise per Week: Not on file  . Minutes of Exercise per Session: Not on file  Stress:   . Feeling of Stress : Not on file  Social Connections:   . Frequency of Communication with Friends and Family: Not on file  . Frequency of Social Gatherings with Friends and Family: Not on file  .  Attends Religious Services: Not on file  . Active Member of Clubs or Organizations: Not on file  . Attends Archivist Meetings: Not on file  . Marital Status: Not on file  Intimate Partner Violence:   . Fear of Current or Ex-Partner: Not on file  . Emotionally Abused: Not on file  . Physically Abused: Not on file  . Sexually Abused: Not on file    Outpatient Medications Prior to Visit  Medication Sig Dispense Refill  . apixaban (ELIQUIS) 5 MG TABS tablet Two tabs po twice a day for one week then one tab po twice a day afterwards 60 tablet 5  . fluticasone (FLONASE) 50 MCG/ACT nasal spray Place 1 spray into both nostrils daily. 16 g 2  . mirtazapine (REMERON) 30 MG tablet Take 15 mg by mouth daily.    Marland Kitchen oxycodone  (ROXICODONE) 30 MG immediate release tablet Take 30 mg by mouth 5 (five) times daily.    Marland Kitchen tiZANidine (ZANAFLEX) 4 MG tablet Take 4 mg by mouth 3 (three) times daily as needed for muscle spasms. Takes 0.5 to 1 tablet once daily at night PRN    . venlafaxine XR (EFFEXOR-XR) 150 MG 24 hr capsule Take 300 mg by mouth daily.    Marland Kitchen zolpidem (AMBIEN) 10 MG tablet Take 10 mg by mouth at bedtime as needed for sleep.    . hydrALAZINE (APRESOLINE) 25 MG tablet Take 0.5 tablets (12.5 mg total) by mouth daily. 13 tablet 0  . Blood Pressure Monitoring (BLOOD PRESSURE KIT) KIT 1 kit by Does not apply route 2 (two) times daily. 1 kit 0  . diphenhydrAMINE (BENADRYL) 25 mg capsule Take 75 mg by mouth at bedtime as needed for allergies or sleep. Patient taking 50 mg QHS as needed.    . rivaroxaban (XARELTO) 20 MG TABS tablet Take 1 tablet (20 mg total) by mouth daily with supper. (Patient not taking: Reported on 04/11/2019) 30 tablet 4   No facility-administered medications prior to visit.    Allergies  Allergen Reactions  . Morphine And Related Hives  . Nsaids Other (See Comments)    Affects platelet count  . Sulfa Antibiotics Shortness Of Breath  . Tape Other (See Comments)    bruises skin  . Verapamil Swelling  . Lisinopril Other (See Comments)    Burning throat, metallic taste  . Other Itching    Pt states she is allergic to the plastic that IV bags are made of. Reaction causes itching relieved by Benadryl.   . Acetazolamide Other (See Comments)    Acid reflux   . Seroquel [Quetiapine Fumarate] Other (See Comments)    Muscle cramps    ROS Review of Systems  Constitutional: Negative.   Respiratory: Negative.   Cardiovascular: Negative.   Gastrointestinal: Negative for blood in stool.  Genitourinary: Negative for hematuria.  Skin: Negative.   Neurological: Negative.   Hematological: Bruises/bleeds easily (intermittent mild bruises).  Psychiatric/Behavioral: Negative.       Objective:     Physical Exam No physical exam was done. BP 129/85 (BP Location: Left Arm, Patient Position: Sitting, Cuff Size: Large)  Wt Readings from Last 3 Encounters:  04/07/19 168 lb (76.2 kg)  03/22/19 169 lb 14.4 oz (77.1 kg)  02/17/19 176 lb 6.4 oz (80 kg)     Health Maintenance Due  Topic Date Due  . PAP SMEAR-Modifier  12/22/1985  . MAMMOGRAM  12/23/2014  . COLONOSCOPY  12/23/2014    There are no preventive care reminders  to display for this patient.  Lab Results  Component Value Date   TSH 1.040 02/16/2019   Lab Results  Component Value Date   WBC 5.7 02/03/2019   HGB 11.6 (L) 02/03/2019   HCT 34.6 (L) 02/03/2019   MCV 82.4 02/03/2019   PLT 272 02/03/2019   Lab Results  Component Value Date   NA 143 02/03/2019   K 3.7 02/03/2019   CO2 24 02/03/2019   GLUCOSE 115 (H) 02/03/2019   BUN 13 02/03/2019   CREATININE 0.90 02/03/2019   CALCIUM 8.9 02/03/2019   ANIONGAP 9 02/03/2019   No results found for: CHOL No results found for: HDL No results found for: LDLCALC No results found for: TRIG No results found for: CHOLHDL Lab Results  Component Value Date   HGBA1C 5.8 (H) 02/16/2019      Assessment & Plan:   1. Essential hypertension -Her blood pressure is under control, and she will continue on current treatment regimen.  She was advised to continue on DASH diet, and exercise as tolerated.  She will continue to check her blood pressure at least 2-3 times weekly, record and bring the log to follow-up visit.  Her goal blood pressure is less than 140/90 and normal blood pressure is less than 120/80. - hydrALAZINE (APRESOLINE) 25 MG tablet; Take 0.5 tablets (12.5 mg total) by mouth daily.  Dispense: 30 tablet; Refill: 2   2. Bruises easily -She reports experiencing intermittent bruising, but she will continue on her Eliquis.  She was advised to go to the emergency room for hematuria, hematochezia, and active bleeding.    Follow-up: Return in about 3 months (around  07/14/2019), or if symptoms worsen or fail to improve.    Danyell Awbrey Jerold Coombe, NP

## 2019-04-18 ENCOUNTER — Other Ambulatory Visit: Payer: Self-pay

## 2019-04-18 ENCOUNTER — Ambulatory Visit (HOSPITAL_COMMUNITY)
Admission: RE | Admit: 2019-04-18 | Discharge: 2019-04-18 | Disposition: A | Discharge status: Home / Self Care | Payer: Self-pay | Source: Ambulatory Visit | Attending: "Endocrinology | Admitting: "Endocrinology

## 2019-04-18 DIAGNOSIS — E049 Nontoxic goiter, unspecified: Secondary | ICD-10-CM | POA: Insufficient documentation

## 2019-04-20 ENCOUNTER — Other Ambulatory Visit: Payer: Self-pay | Admitting: "Endocrinology

## 2019-04-20 DIAGNOSIS — E049 Nontoxic goiter, unspecified: Secondary | ICD-10-CM

## 2019-04-27 ENCOUNTER — Other Ambulatory Visit: Payer: Self-pay | Admitting: Gerontology

## 2019-04-27 ENCOUNTER — Ambulatory Visit (HOSPITAL_COMMUNITY)
Admission: RE | Admit: 2019-04-27 | Discharge: 2019-04-27 | Disposition: A | Discharge status: Home / Self Care | Payer: Self-pay | Source: Ambulatory Visit | Attending: "Endocrinology | Admitting: "Endocrinology

## 2019-04-27 ENCOUNTER — Other Ambulatory Visit: Payer: Self-pay | Admitting: "Endocrinology

## 2019-04-27 ENCOUNTER — Other Ambulatory Visit: Payer: Self-pay

## 2019-04-27 ENCOUNTER — Encounter (HOSPITAL_COMMUNITY): Payer: Self-pay

## 2019-04-27 DIAGNOSIS — E049 Nontoxic goiter, unspecified: Secondary | ICD-10-CM

## 2019-04-27 DIAGNOSIS — Z7901 Long term (current) use of anticoagulants: Secondary | ICD-10-CM | POA: Insufficient documentation

## 2019-04-27 MED ORDER — ENOXAPARIN SODIUM 80 MG/0.8ML ~~LOC~~ SOLN: 80.0000 mg | Freq: Two times a day (BID) | SUBCUTANEOUS | 0 refills | Status: DC

## 2019-04-27 MED ORDER — LIDOCAINE HCL (PF) 2 % IJ SOLN
INTRAMUSCULAR | Status: AC
  Filled 2019-04-27: qty 10

## 2019-04-27 NOTE — Progress Notes (Signed)
Patient ID: TALER BOUTELL, female   DOB: 1965-11-16, 54 y.o.   MRN: [id number]   Pt was scheduled for Thyroid nodule biopsy today  She is taking Eliquis daily- actively PE 01/2019 Hx DVT 2002  I have spoken to her PCP Dr Hattie Perch - [facility] [place] ,[place] I have call into Dr Dorris Fetch-- ordering Physician  Must be off Eliquis x 2 days before procedure   Rec: off x 2 days and restart after procedure  Or Off for 2 days and bridge with Lovenox. Hold Lovenox day pf procedure And restart following day  Dr Hattie Perch to discuss with pts Hematolgy MD And call me back  We will reschedule accordingly  Pt is aware and agreeable

## 2019-05-03 ENCOUNTER — Telehealth: Payer: Self-pay | Admitting: Pharmacist

## 2019-05-03 NOTE — Telephone Encounter (Signed)
05/03/2019 10:30:25 AM - Eliquis BMS renewal to pat. & dr  05/03/2019 Received notice from BMS that patient needs to renew enrollment-mailing form to patient to sign & return with current monthly income, also sending provider @ [facility] form and script to sign.Tracey Perez

## 2019-05-04 ENCOUNTER — Encounter (HOSPITAL_COMMUNITY): Payer: Self-pay

## 2019-05-04 ENCOUNTER — Other Ambulatory Visit: Payer: Self-pay

## 2019-05-04 ENCOUNTER — Ambulatory Visit (HOSPITAL_COMMUNITY)
Admission: RE | Admit: 2019-05-04 | Discharge: 2019-05-04 | Disposition: A | Discharge status: Home / Self Care | Payer: Self-pay | Source: Ambulatory Visit | Attending: "Endocrinology | Admitting: "Endocrinology

## 2019-05-04 DIAGNOSIS — E041 Nontoxic single thyroid nodule: Secondary | ICD-10-CM | POA: Insufficient documentation

## 2019-05-04 MED ORDER — LIDOCAINE HCL (PF) 2 % IJ SOLN
INTRAMUSCULAR | Status: AC
  Filled 2019-05-04: qty 20

## 2019-05-05 LAB — CYTOLOGY - NON PAP

## 2019-05-06 ENCOUNTER — Telehealth: Payer: Self-pay | Admitting: Pharmacist

## 2019-05-06 NOTE — Telephone Encounter (Signed)
05/06/2019 10:13:29 AM - Eliquis pending  05/06/2019 I have received the providers signed portion of [employer] application for Eliquis 5mg  Take 1 tablet by mouth 2 times a day, holding for patient to return her portion--mailed to patient 05/03/2019 to sign, and return with 1 full month of income.Delos Haring

## 2019-05-12 ENCOUNTER — Ambulatory Visit (INDEPENDENT_AMBULATORY_CARE_PROVIDER_SITE_OTHER): Payer: Self-pay | Admitting: "Endocrinology

## 2019-05-12 ENCOUNTER — Encounter: Payer: Self-pay | Admitting: "Endocrinology

## 2019-05-12 DIAGNOSIS — E049 Nontoxic goiter, unspecified: Secondary | ICD-10-CM

## 2019-05-12 NOTE — Progress Notes (Signed)
05/12/2019, 9:03 AM                                Endocrinology Telehealth Visit Follow up Note -During COVID -19 Pandemic  I connected with Tracey Perez on 05/12/2019   by telephone and verified that I am speaking with the correct person using two identifiers. Tracey Perez, 1966/02/07. she has verbally consented to this visit. All issues noted in this document were discussed and addressed. The format was not optimal for physical exam.   Subjective:    Patient ID: Tracey Perez, female    DOB: 1965/12/27, PCP Langston Reusing, NP   Past Medical History:  Diagnosis Date  . DVT (deep vein thrombosis) in pregnancy    Past Surgical History:  Procedure Laterality Date  . ABDOMINAL HYSTERECTOMY    . TONSILLECTOMY     Social History   Socioeconomic History  . Marital status: Divorced    Spouse name: Not on file  . Number of children: Not on file  . Years of education: Not on file  . Highest education level: Not on file  Occupational History  . Not on file  Tobacco Use  . Smoking status: Former Smoker    Packs/day: 1.00    Years: 25.00    Pack years: 25.00    Types: Cigarettes    Quit date: 12/17/2011    Years since quitting: 7.4  . Smokeless tobacco: Never Used  Substance and Sexual Activity  . Alcohol use: Never  . Drug use: Not on file  . Sexual activity: Not on file  Other Topics Concern  . Not on file  Social History Narrative  . Not on file   Social Determinants of Health   Financial Resource Strain:   . Difficulty of Paying Living Expenses: Not on file  Food Insecurity:   . Worried About Charity fundraiser in the Last Year: Not on file  . Ran Out of Food in the Last Year: Not on file  Transportation Needs:   . Lack of Transportation (Medical): Not on file  . Lack of Transportation (Non-Medical): Not on file  Physical Activity:   . Days of Exercise per Week: Not on file  . Minutes of Exercise  per Session: Not on file  Stress:   . Feeling of Stress : Not on file  Social Connections:   . Frequency of Communication with Friends and Family: Not on file  . Frequency of Social Gatherings with Friends and Family: Not on file  . Attends Religious Services: Not on file  . Active Member of Clubs or Organizations: Not on file  . Attends Archivist Meetings: Not on file  . Marital Status: Not on file   Family History  Problem Relation Age of Onset  . Non-Hodgkin's lymphoma Mother   . Hypertension Father   . Depression Father   . Alcohol abuse Father   . Migraines Father   . Multiple sclerosis Sister   . Migraines Sister   . Migraines Brother   . Thyroid disease Brother   . Migraines Brother   . Thyroid disease Brother   .  Thyroid disease Brother   . Thyroid disease Brother    Outpatient Encounter Medications as of 05/12/2019  Medication Sig  . apixaban (ELIQUIS) 5 MG TABS tablet Two tabs po twice a day for one week then one tab po twice a day afterwards  . Blood Pressure Monitoring (BLOOD PRESSURE KIT) KIT 1 kit by Does not apply route 2 (two) times daily.  . diphenhydrAMINE (BENADRYL) 25 mg capsule Take 75 mg by mouth at bedtime as needed for allergies or sleep. Patient taking 50 mg QHS as needed.  . enoxaparin (LOVENOX) 80 MG/0.8ML injection Inject 0.8 mLs (80 mg total) into the skin every 12 (twelve) hours. Stop Eliquis 3 days prior to procedure, inject Lovenox 80 mg twice daily for 3 days, Don't inject Lovenox the morning of procedure and restart Eliquis 5 mg the same night after procedure.  . fluticasone (FLONASE) 50 MCG/ACT nasal spray Place 1 spray into both nostrils daily.  . hydrALAZINE (APRESOLINE) 25 MG tablet Take 0.5 tablets (12.5 mg total) by mouth daily.  . mirtazapine (REMERON) 30 MG tablet Take 15 mg by mouth daily.  Marland Kitchen oxycodone (ROXICODONE) 30 MG immediate release tablet Take 30 mg by mouth 5 (five) times daily.  Marland Kitchen tiZANidine (ZANAFLEX) 4 MG tablet Take  4 mg by mouth 3 (three) times daily as needed for muscle spasms. Takes 0.5 to 1 tablet once daily at night PRN  . venlafaxine XR (EFFEXOR-XR) 150 MG 24 hr capsule Take 300 mg by mouth daily.  Marland Kitchen zolpidem (AMBIEN) 10 MG tablet Take 10 mg by mouth at bedtime as needed for sleep.   No facility-administered encounter medications on file as of 05/12/2019.   ALLERGIES: Allergies  Allergen Reactions  . Morphine And Related Hives  . Nsaids Other (See Comments)    Affects platelet count  . Sulfa Antibiotics Shortness Of Breath  . Tape Other (See Comments)    bruises skin  . Verapamil Swelling  . Lisinopril Other (See Comments)    Burning throat, metallic taste  . Other Itching    Pt states she is allergic to the plastic that IV bags are made of. Reaction causes itching relieved by Benadryl.   . Acetazolamide Other (See Comments)    Acid reflux   . Seroquel [Quetiapine Fumarate] Other (See Comments)    Muscle cramps    VACCINATION STATUS: Immunization History  Administered Date(s) Administered  . Influenza,inj,Quad PF,6+ Mos 02/16/2019  . Influenza-Unspecified 01/25/2012, 01/13/2013, 01/05/2014, 01/29/2015, 02/07/2016  . Tdap 12/09/2010    HPI Tracey Perez is 54 y.o. female who is being engaged in telehealth via telephone after she was seen in consultation for thyroid nodule. PCP:  Langston Reusing, NP.   She denies any prior history of thyroid dysfunction.  She was undergoing CT chest for history of pulmonary embolism when she was found to have a 1.6 cm nodule on the right lobe of her thyroid. -She was sent for fine-needle aspiration of this nodule after her last visit.  As results were consistent with atypia of undetermined significance.  A sample was sent for molecular studies, results are pending. Patient denies any dysphagia, shortness of breath, no voice change.  She has steady weight, denies palpitations, tremors, heat/cold intolerance.  She has some family history of  thyroid dysfunction, however no family history of thyroid malignancy.  She is a former smoker quit in 2013. Her recent thyroid function tests were within normal limits.  Review of Systems Limited as above.  Objective:  There were no vitals taken for this visit.  Wt Readings from Last 3 Encounters:  04/07/19 168 lb (76.2 kg)  03/22/19 169 lb 14.4 oz (77.1 kg)  02/17/19 176 lb 6.4 oz (80 kg)    Physical Exam   CMP     Component Value Date/Time   NA 143 02/03/2019 0453   K 3.7 02/03/2019 0453   CL 110 02/03/2019 0453   CO2 24 02/03/2019 0453   GLUCOSE 115 (H) 02/03/2019 0453   BUN 13 02/03/2019 0453   CREATININE 0.90 02/03/2019 0453   CALCIUM 8.9 02/03/2019 0453   GFRNONAA >60 02/03/2019 0453   GFRAA >60 02/03/2019 0453     Diabetic Labs (most recent): Lab Results  Component Value Date   HGBA1C 5.8 (H) 02/16/2019     Lab Results  Component Value Date   TSH 1.040 02/16/2019     Assessment & Plan:   1. Nodular goiter 2.  Abnormal fine-needle aspiration findings  He was found to have thyroid incidentaloma, subsequently confirmed to be multinodular goiter.  2.4 cm nodule on the right lobe was suggested for fine-needle aspiration.  Findings were consistent with atypia of undetermined significance.  A sample was sent for Afirma molecular  study - results are not available to review. -If Afirma confirms high risk of malignancy, she will be considered for total thyroidectomy.  She understands the subsequent need for thyroid hormone replacement. - I did not initiate any new prescriptions today. - she is advised to maintain close follow up with [place], Chioma E, NP for primary care needs.    - Time spent on this patient care encounter:  25 minutes of which 50% was spent in  counseling and the rest reviewing  her current and  previous labs / studies and medications  doses and developing a plan for long term care. Tracey Perez  participated in the discussions,  expressed understanding, and voiced agreement with the above plans.  All questions were answered to her satisfaction. she is encouraged to contact clinic should she have any questions or concerns prior to her return visit.   Follow up plan: Return in about 4 weeks (around 06/09/2019), or we are waiting for  the results of molecular study, for Follow up with Meter and Logs Only - no Labs.   Glade Lloyd, MD [facility] [address] [place], [place] [address] Phone: [phone]  Fax: [phone]     05/12/2019, 9:03 AM  This note was partially dictated with voice recognition software. Similar sounding words can be transcribed inadequately or may not  be corrected upon review.

## 2019-05-31 ENCOUNTER — Encounter (HOSPITAL_COMMUNITY): Payer: Self-pay

## 2019-06-06 ENCOUNTER — Other Ambulatory Visit: Payer: Self-pay | Admitting: Psychiatry

## 2019-06-06 ENCOUNTER — Ambulatory Visit: Payer: Self-pay

## 2019-06-06 ENCOUNTER — Other Ambulatory Visit: Payer: Self-pay

## 2019-06-06 DIAGNOSIS — Z79899 Other long term (current) drug therapy: Secondary | ICD-10-CM

## 2019-06-06 NOTE — Progress Notes (Addendum)
Medication Management Clinic Visit Note  Patient: Tracey Perez MRN: [id number] Date of Birth: 23-Aug-1965 PCP: Langston Reusing, NP   [name] 54 y.o. female presents for a telephone medication therapy management visit with the pharmacist today. Patient identity verified using two patient identifiers.   There were no vitals taken for this visit.  Patient Information   Past Medical History:  Diagnosis Date  . DVT (deep vein thrombosis) in pregnancy       Past Surgical History:  Procedure Laterality Date  . ABDOMINAL HYSTERECTOMY    . TONSILLECTOMY       Family History  Problem Relation Age of Onset  . Non-Hodgkin's lymphoma Mother   . Hypertension Father   . Depression Father   . Alcohol abuse Father   . Migraines Father   . Multiple sclerosis Sister   . Migraines Sister   . Migraines Brother   . Thyroid disease Brother   . Migraines Brother   . Thyroid disease Brother   . Thyroid disease Brother   . Thyroid disease Brother     New Diagnoses (since last visit): Nodular goiter  Family Support: Good    Social History   Substance and Sexual Activity  Alcohol Use Never   Denies alcohol consumption   Social History   Tobacco Use  Smoking Status Former Smoker  . Packs/day: 1.00  . Years: 25.00  . Pack years: 25.00  . Types: Cigarettes  . Quit date: 12/17/2011  . Years since quitting: 7.4  Smokeless Tobacco Never Used   Endorses continued abstinence from tobacco. Denies illicit drug use.    Health Maintenance  Topic Date Due  . PAP SMEAR-Modifier  12/22/1985  . MAMMOGRAM  12/23/2014  . COLONOSCOPY  12/23/2014  . TETANUS/TDAP  12/08/2020  . INFLUENZA VACCINE  Completed  . HIV Screening  Completed   Outpatient Encounter Medications as of 06/06/2019  Medication Sig  . apixaban (ELIQUIS) 5 MG TABS tablet Two tabs po twice a day for one week then one tab po twice a day afterwards  . Blood Pressure Monitoring (BLOOD PRESSURE KIT) KIT 1 kit  by Does not apply route 2 (two) times daily.  . Cholecalciferol (VITAMIN D) 125 MCG (5000 UT) CAPS Take 5,000 Units by mouth at bedtime.  . diphenhydrAMINE (BENADRYL) 25 mg capsule Take 100 mg by mouth at bedtime as needed for allergies or sleep. Patient taking 100 mg QHS as needed.  . gabapentin (NEURONTIN) 300 MG capsule Take 300 mg by mouth 3 (three) times daily.  . hydrALAZINE (APRESOLINE) 25 MG tablet Take 0.5 tablets (12.5 mg total) by mouth daily.  . mirtazapine (REMERON) 30 MG tablet Take 15 mg by mouth daily.  Marland Kitchen oxycodone (ROXICODONE) 30 MG immediate release tablet Take 30 mg by mouth 5 (five) times daily.  Marland Kitchen tiZANidine (ZANAFLEX) 4 MG tablet Take 4 mg by mouth 3 (three) times daily as needed for muscle spasms. Takes 2 to 4 tablet once daily at night PRN  . venlafaxine XR (EFFEXOR-XR) 150 MG 24 hr capsule Take 300 mg by mouth daily.  Marland Kitchen zolpidem (AMBIEN) 10 MG tablet Take 10 mg by mouth at bedtime as needed for sleep.  . fluticasone (FLONASE) 50 MCG/ACT nasal spray Place 1 spray into both nostrils daily. (Patient not taking: Reported on 06/06/2019)  . [DISCONTINUED] enoxaparin (LOVENOX) 80 MG/0.8ML injection Inject 0.8 mLs (80 mg total) into the skin every 12 (twelve) hours. Stop Eliquis 3 days prior to procedure, inject Lovenox 80 mg  twice daily for 3 days, Don't inject Lovenox the morning of procedure and restart Eliquis 5 mg the same night after procedure.   No facility-administered encounter medications on file as of 06/06/2019.    Health Maintenance/Date Completed  Last ED visit: Admitted at [facility] 02/02/2019-02/03/2019 with bilateral pulmonary emboli Last Visit to PCP: 04/11/2019 [facility] Next Visit to PCP: 06/21/2019 [facility]) Specialist Visit: [facility] (Dr. Dorris Fetch)- Next visit 06/10/2019 to discuss thyroid biopsy results Dental Exam: Not since patient lost insurance Eye Exam: Wears glasses. No eye exam since loss of insurance  Pelvic/PAP Exam: History of total  hysterectomy  Mammogram: Estimates last was > 5 years ago Colonoscopy: Estimates last was > 10 years ago Flu Vaccine: Endorses receipt Pneumonia Vaccine: <65 y/o. May be a candidate for PPSV23 before age 64 with smoking history COVID-19 Vaccine: Never Shingrix Vaccine: Never  Assessment and Plan:  1. Thyroid nodule -Following with endocrinology for multinodular goiter  -Thyroid biopsy consistent with atypia of undetermined significance and sample sent for Afirma molecular study -Follow-up appointment with endocrinology 2/19 to discuss results   2. Bilateral pulmonary embolism -Hospitalized at [facility] 02/02/2019-02/03/2019 with unprovoked bilateral PE -Apixaban 5 mg BID. Patient to remain on anticoagulation indefinitely -Denies signs/symptoms of bleeding -Denies recent falls -Does not take NSAIDs or aspirin  3. Chronic low back pain -Oxycodone 30 mg IR five times daily, tizanidine 4 mg 2-4 tablets at night PRN, gabapentin 300 mg TID -Does not have naloxone at home. Un-able to fill previously due to financial constraints  4. Depression/insomnia -Venlafaxine XR 300 mg daily, mirtazapine 15 mg daily, zolpidem 10 mg QHS PRN sleep, diphenhydramine 100 mg QHS PRN sleep  5. Essential Hypertension -Hydralazine 12.5 mg daily -Has blood pressure monitoring device at home but has not checked recently. Advised patient to increase frequency of blood pressure monitoring.  6. Prediabetes -Hemoglobin A1c 5.8% on 02/16/2019 -Lifestyle management only  7. Seasonal Allergies -Flonase nasal spray. Reports she takes this in the springtime for seasonal allergies  8. Health maintenance -Due for mammogram. Provided patient with information for [facility] -Due for colonoscopy, eye exam, dental exam -Due for COVID-19 vaccine and Shingrix series  9. Drug-drug interactions -Patient is on multiple sedating medications including oxycodone, tizanidine, gabapentin, diphenhydramine,  mirtazapine -Denies feelings of over-sedation, un-steadiness on feet, or falls -Does not consume alcohol -Counseled patient to be cautious while on these medications and to seek medical attention in the event that she falls and hits her head as she is on a blood thinner  10. Medication adherence -Endorses taking medications daily as prescribed -Not requiring refills on medications at this time -Does not utilize a pillbox  RTC 6 months  [place] Resident 06 June 2019

## 2019-06-10 ENCOUNTER — Ambulatory Visit (INDEPENDENT_AMBULATORY_CARE_PROVIDER_SITE_OTHER): Payer: Self-pay | Admitting: "Endocrinology

## 2019-06-10 ENCOUNTER — Encounter: Payer: Self-pay | Admitting: "Endocrinology

## 2019-06-10 ENCOUNTER — Other Ambulatory Visit: Payer: Self-pay

## 2019-06-10 DIAGNOSIS — C73 Malignant neoplasm of thyroid gland: Secondary | ICD-10-CM

## 2019-06-10 NOTE — Progress Notes (Signed)
06/10/2019, 11:14 AM                                Endocrinology Telehealth Visit Follow up Note -During COVID -19 Pandemic  I connected with Tracey Perez on 06/10/2019   by telephone and verified that I am speaking with the correct person using two identifiers. Tracey Perez, April 13, 1966. she has verbally consented to this visit. All issues noted in this document were discussed and addressed. The format was not optimal for physical exam.   Subjective:    Patient ID: Tracey Perez, female    DOB: 1965/11/13, PCP Langston Reusing, NP   Past Medical History:  Diagnosis Date  . DVT (deep vein thrombosis) in pregnancy    Past Surgical History:  Procedure Laterality Date  . ABDOMINAL HYSTERECTOMY    . TONSILLECTOMY     Social History   Socioeconomic History  . Marital status: Divorced    Spouse name: Not on file  . Number of children: Not on file  . Years of education: Not on file  . Highest education level: Not on file  Occupational History  . Not on file  Tobacco Use  . Smoking status: Former Smoker    Packs/day: 1.00    Years: 25.00    Pack years: 25.00    Types: Cigarettes    Quit date: 12/17/2011    Years since quitting: 7.4  . Smokeless tobacco: Never Used  Substance and Sexual Activity  . Alcohol use: Never  . Drug use: Not on file  . Sexual activity: Not on file  Other Topics Concern  . Not on file  Social History Narrative  . Not on file   Social Determinants of Health   Financial Resource Strain:   . Difficulty of Paying Living Expenses: Not on file  Food Insecurity:   . Worried About Charity fundraiser in the Last Year: Not on file  . Ran Out of Food in the Last Year: Not on file  Transportation Needs:   . Lack of Transportation (Medical): Not on file  . Lack of Transportation (Non-Medical): Not on file  Physical Activity:   . Days of Exercise per Week: Not on file  . Minutes of  Exercise per Session: Not on file  Stress:   . Feeling of Stress : Not on file  Social Connections:   . Frequency of Communication with Friends and Family: Not on file  . Frequency of Social Gatherings with Friends and Family: Not on file  . Attends Religious Services: Not on file  . Active Member of Clubs or Organizations: Not on file  . Attends Archivist Meetings: Not on file  . Marital Status: Not on file   Family History  Problem Relation Age of Onset  . Non-Hodgkin's lymphoma Mother   . Hypertension Father   . Depression Father   . Alcohol abuse Father   . Migraines Father   . Multiple sclerosis Sister   . Migraines Sister   . Migraines Brother   . Thyroid disease Brother   . Migraines Brother   . Thyroid disease Brother   .  Thyroid disease Brother   . Thyroid disease Brother    Outpatient Encounter Medications as of 06/10/2019  Medication Sig  . apixaban (ELIQUIS) 5 MG TABS tablet Two tabs po twice a day for one week then one tab po twice a day afterwards  . Blood Pressure Monitoring (BLOOD PRESSURE KIT) KIT 1 kit by Does not apply route 2 (two) times daily.  . Cholecalciferol (VITAMIN D) 125 MCG (5000 UT) CAPS Take 5,000 Units by mouth at bedtime.  . diphenhydrAMINE (BENADRYL) 25 mg capsule Take 100 mg by mouth at bedtime as needed for allergies or sleep. Patient taking 100 mg QHS as needed.  . fluticasone (FLONASE) 50 MCG/ACT nasal spray Place 1 spray into both nostrils daily. (Patient not taking: Reported on 06/06/2019)  . gabapentin (NEURONTIN) 300 MG capsule Take 300 mg by mouth 3 (three) times daily.  . hydrALAZINE (APRESOLINE) 25 MG tablet Take 0.5 tablets (12.5 mg total) by mouth daily.  . mirtazapine (REMERON) 30 MG tablet Take 15 mg by mouth daily.  Marland Kitchen oxycodone (ROXICODONE) 30 MG immediate release tablet Take 30 mg by mouth 5 (five) times daily.  Marland Kitchen tiZANidine (ZANAFLEX) 4 MG tablet Take 4 mg by mouth 3 (three) times daily as needed for muscle spasms.  Takes 2 to 4 tablet once daily at night PRN  . venlafaxine XR (EFFEXOR-XR) 150 MG 24 hr capsule Take 300 mg by mouth daily.  Marland Kitchen zolpidem (AMBIEN) 10 MG tablet Take 10 mg by mouth at bedtime as needed for sleep.   No facility-administered encounter medications on file as of 06/10/2019.   ALLERGIES: Allergies  Allergen Reactions  . Morphine And Related Hives  . Nsaids Other (See Comments)    Affects platelet count  . Sulfa Antibiotics Shortness Of Breath  . Tape Other (See Comments)    bruises skin  . Verapamil Swelling  . Lisinopril Other (See Comments)    Burning throat, metallic taste  . Other Itching    Pt states she is allergic to the plastic that IV bags are made of. Reaction causes itching relieved by Benadryl.   . Acetazolamide Other (See Comments)    Acid reflux   . Seroquel [Quetiapine Fumarate] Other (See Comments)    Muscle cramps    VACCINATION STATUS: Immunization History  Administered Date(s) Administered  . Influenza,inj,Quad PF,6+ Mos 02/16/2019  . Influenza-Unspecified 01/25/2012, 01/13/2013, 01/05/2014, 01/29/2015, 02/07/2016  . Tdap 12/09/2010    HPI SESILIA POUCHER is 54 y.o. female who is being engaged in telehealth via telephone after she was seen in consultation for thyroid nodule. PCP:  Langston Reusing, NP.   She denies any prior history of thyroid dysfunction.  She was undergoing CT chest for history of pulmonary embolism when she was found to have a 1.6 cm nodule on the right lobe of her thyroid. -She was sent for fine-needle aspiration of this nodule after her last visit.  As results were consistent with atypia of undetermined significance.  A sample was sent for molecular studies, results are consistent with 50% malignancy risk.   Patient denies any dysphagia, shortness of breath, no voice change.  She has steady weight, denies palpitations, tremors, heat/cold intolerance.  She has some family history of thyroid dysfunction, however no family  history of thyroid malignancy.  She is a former smoker quit in 2013. Her recent thyroid function tests were within normal limits.  Review of Systems Limited as above.  Objective:    There were no vitals taken for this visit.  Wt Readings from Last 3 Encounters:  04/07/19 168 lb (76.2 kg)  03/22/19 169 lb 14.4 oz (77.1 kg)  02/17/19 176 lb 6.4 oz (80 kg)    Physical Exam   CMP     Component Value Date/Time   NA 143 02/03/2019 0453   K 3.7 02/03/2019 0453   CL 110 02/03/2019 0453   CO2 24 02/03/2019 0453   GLUCOSE 115 (H) 02/03/2019 0453   BUN 13 02/03/2019 0453   CREATININE 0.90 02/03/2019 0453   CALCIUM 8.9 02/03/2019 0453   GFRNONAA >60 02/03/2019 0453   GFRAA >60 02/03/2019 0453     Diabetic Labs (most recent): Lab Results  Component Value Date   HGBA1C 5.8 (H) 02/16/2019     Lab Results  Component Value Date   TSH 1.040 02/16/2019     Assessment & Plan:   1.  Malignancy of the thyroid gland 2.  Abnormal fine-needle aspiration findings  He was found to have thyroid incidentaloma, subsequently confirmed to be multinodular goiter.  2.4 cm nodule on the right lobe was suggested for fine-needle aspiration.  Findings were consistent with atypia of undetermined significance.  A sample was sent for Afirma molecular studies, results are consistent with 50% malignancy risk.  -She is approached for surgical treatment and she agrees.  She will be considered for total thyroidectomy.  I discussed options and sequence of treatment with her and she agrees to consult with Dr. Armandina Gemma in [place].  She understands the subsequent need for thyroid hormone replacement. - I did not initiate any new prescriptions today.  She will return in 6 weeks (after her surgery with labs). - she is advised to maintain close follow up with [place], Chioma E, NP for primary care needs.     - Time spent on this patient care encounter:  25 minutes of which 50% was spent in  counseling  and the rest reviewing  her current and  previous labs / studies and medications  doses and developing a plan for long term care. Tracey Perez  participated in the discussions, expressed understanding, and voiced agreement with the above plans.  All questions were answered to her satisfaction. she is encouraged to contact clinic should she have any questions or concerns prior to her return visit.  Follow up plan: Return in about 6 weeks (around 07/22/2019) for Follow up with Labs after Surgery.   Glade Lloyd, MD [facility] [address] [place], [place] [address] Phone: [phone]  Fax: [phone]     06/10/2019, 11:14 AM  This note was partially dictated with voice recognition software. Similar sounding words can be transcribed inadequately or may not  be corrected upon review.

## 2019-06-15 ENCOUNTER — Other Ambulatory Visit: Payer: Self-pay

## 2019-06-15 DIAGNOSIS — D509 Iron deficiency anemia, unspecified: Secondary | ICD-10-CM

## 2019-06-15 DIAGNOSIS — I10 Essential (primary) hypertension: Secondary | ICD-10-CM

## 2019-06-15 DIAGNOSIS — R7303 Prediabetes: Secondary | ICD-10-CM

## 2019-06-15 NOTE — Progress Notes (Signed)
t

## 2019-06-16 LAB — IRON AND TIBC
Iron Saturation: 15 % (ref 15–55)
Iron: 51 ug/dL (ref 27–159)
Total Iron Binding Capacity: 351 ug/dL (ref 250–450)
UIBC: 300 ug/dL (ref 131–425)

## 2019-06-16 LAB — HEMOGLOBIN A1C
Est. average glucose Bld gHb Est-mCnc: 114 mg/dL
Hgb A1c MFr Bld: 5.6 % (ref 4.8–5.6)

## 2019-06-16 LAB — LIPID PANEL
Chol/HDL Ratio: 4.7 ratio — ABNORMAL HIGH (ref 0.0–4.4)
Cholesterol, Total: 261 mg/dL — ABNORMAL HIGH (ref 100–199)
HDL: 55 mg/dL (ref >39)
LDL Chol Calc (NIH): 162 mg/dL — ABNORMAL HIGH (ref 0–99)
Triglycerides: 239 mg/dL — ABNORMAL HIGH (ref 0–149)
VLDL Cholesterol Cal: 44 mg/dL — ABNORMAL HIGH (ref 5–40)

## 2019-06-17 ENCOUNTER — Other Ambulatory Visit: Payer: Self-pay

## 2019-06-21 ENCOUNTER — Telehealth: Payer: Self-pay | Admitting: "Endocrinology

## 2019-06-21 ENCOUNTER — Ambulatory Visit: Payer: Self-pay | Admitting: Gerontology

## 2019-06-21 ENCOUNTER — Encounter: Payer: Self-pay | Admitting: Gerontology

## 2019-06-21 ENCOUNTER — Other Ambulatory Visit: Payer: Self-pay

## 2019-06-21 VITALS — BP 146/101 | HR 95 | Ht 63.0 in | Wt 176.4 lb

## 2019-06-21 DIAGNOSIS — E041 Nontoxic single thyroid nodule: Secondary | ICD-10-CM

## 2019-06-21 DIAGNOSIS — E785 Hyperlipidemia, unspecified: Secondary | ICD-10-CM | POA: Insufficient documentation

## 2019-06-21 DIAGNOSIS — I10 Essential (primary) hypertension: Secondary | ICD-10-CM

## 2019-06-21 MED ORDER — HYDRALAZINE HCL 25 MG PO TABS: 25.0000 mg | ORAL_TABLET | Freq: Every day | ORAL | 2 refills | Status: DC

## 2019-06-21 MED ORDER — HYDRALAZINE HCL 25 MG PO TABS: 25.0000 mg | ORAL_TABLET | Freq: Every day | ORAL | 1 refills | Status: DC

## 2019-06-21 NOTE — Patient Instructions (Signed)
DASH Eating Plan DASH stands for "Dietary Approaches to Stop Hypertension." The DASH eating plan is a healthy eating plan that has been shown to reduce high blood pressure (hypertension). It may also reduce your risk for type 2 diabetes, heart disease, and stroke. The DASH eating plan may also help with weight loss. What are tips for following this plan?  General guidelines  Avoid eating more than 2,300 mg (milligrams) of salt (sodium) a day. If you have hypertension, you may need to reduce your sodium intake to 1,500 mg a day.  Limit alcohol intake to no more than 1 drink a day for nonpregnant women and 2 drinks a day for men. One drink equals 12 oz of beer, 5 oz of wine, or 1 oz of hard liquor.  Work with your health care provider to maintain a healthy body weight or to lose weight. Ask what an ideal weight is for you.  Get at least 30 minutes of exercise that causes your heart to beat faster (aerobic exercise) most days of the week. Activities may include walking, swimming, or biking.  Work with your health care provider or diet and nutrition specialist (dietitian) to adjust your eating plan to your individual calorie needs. Reading food labels   Check food labels for the amount of sodium per serving. Choose foods with less than 5 percent of the Daily Value of sodium. Generally, foods with less than 300 mg of sodium per serving fit into this eating plan.  To find whole grains, look for the word "whole" as the first word in the ingredient list. Shopping  Buy products labeled as "low-sodium" or "no salt added."  Buy fresh foods. Avoid canned foods and premade or frozen meals. Cooking  Avoid adding salt when cooking. Use salt-free seasonings or herbs instead of table salt or sea salt. Check with your health care provider or pharmacist before using salt substitutes.  Do not fry foods. Cook foods using healthy methods such as baking, boiling, grilling, and broiling instead.  Cook with  heart-healthy oils, such as olive, canola, soybean, or sunflower oil. Meal planning  Eat a balanced diet that includes: ? 5 or more servings of fruits and vegetables each day. At each meal, try to fill half of your plate with fruits and vegetables. ? Up to 6-8 servings of whole grains each day. ? Less than 6 oz of lean meat, poultry, or fish each day. A 3-oz serving of meat is about the same size as a deck of cards. One egg equals 1 oz. ? 2 servings of low-fat dairy each day. ? A serving of nuts, seeds, or beans 5 times each week. ? Heart-healthy fats. Healthy fats called Omega-3 fatty acids are found in foods such as flaxseeds and coldwater fish, like sardines, salmon, and mackerel.  Limit how much you eat of the following: ? Canned or prepackaged foods. ? Food that is high in trans fat, such as fried foods. ? Food that is high in saturated fat, such as fatty meat. ? Sweets, desserts, sugary drinks, and other foods with added sugar. ? Full-fat dairy products.  Do not salt foods before eating.  Try to eat at least 2 vegetarian meals each week.  Eat more home-cooked food and less restaurant, buffet, and fast food.  When eating at a restaurant, ask that your food be prepared with less salt or no salt, if possible. What foods are recommended? The items listed may not be a complete list. Talk with your dietitian about   what dietary choices are best for you. Grains Whole-grain or whole-wheat bread. Whole-grain or whole-wheat pasta. Brown rice. Oatmeal. Quinoa. Bulgur. Whole-grain and low-sodium cereals. Pita bread. Low-fat, low-sodium crackers. Whole-wheat flour tortillas. Vegetables Fresh or frozen vegetables (raw, steamed, roasted, or grilled). Low-sodium or reduced-sodium tomato and vegetable juice. Low-sodium or reduced-sodium tomato sauce and tomato paste. Low-sodium or reduced-sodium canned vegetables. Fruits All fresh, dried, or frozen fruit. Canned fruit in natural juice (without  added sugar). Meat and other protein foods Skinless chicken or turkey. Ground chicken or turkey. Pork with fat trimmed off. Fish and seafood. Egg whites. Dried beans, peas, or lentils. Unsalted nuts, nut butters, and seeds. Unsalted canned beans. Lean cuts of beef with fat trimmed off. Low-sodium, lean deli meat. Dairy Low-fat (1%) or fat-free (skim) milk. Fat-free, low-fat, or reduced-fat cheeses. Nonfat, low-sodium ricotta or cottage cheese. Low-fat or nonfat yogurt. Low-fat, low-sodium cheese. Fats and oils Soft margarine without trans fats. Vegetable oil. Low-fat, reduced-fat, or light mayonnaise and salad dressings (reduced-sodium). Canola, safflower, olive, soybean, and sunflower oils. Avocado. Seasoning and other foods Herbs. Spices. Seasoning mixes without salt. Unsalted popcorn and pretzels. Fat-free sweets. What foods are not recommended? The items listed may not be a complete list. Talk with your dietitian about what dietary choices are best for you. Grains Baked goods made with fat, such as croissants, muffins, or some breads. Dry pasta or rice meal packs. Vegetables Creamed or fried vegetables. Vegetables in a cheese sauce. Regular canned vegetables (not low-sodium or reduced-sodium). Regular canned tomato sauce and paste (not low-sodium or reduced-sodium). Regular tomato and vegetable juice (not low-sodium or reduced-sodium). Pickles. Olives. Fruits Canned fruit in a light or heavy syrup. Fried fruit. Fruit in cream or butter sauce. Meat and other protein foods Fatty cuts of meat. Ribs. Fried meat. Bacon. Sausage. Bologna and other processed lunch meats. Salami. Fatback. Hotdogs. Bratwurst. Salted nuts and seeds. Canned beans with added salt. Canned or smoked fish. Whole eggs or egg yolks. Chicken or turkey with skin. Dairy Whole or 2% milk, cream, and half-and-half. Whole or full-fat cream cheese. Whole-fat or sweetened yogurt. Full-fat cheese. Nondairy creamers. Whipped toppings.  Processed cheese and cheese spreads. Fats and oils Butter. Stick margarine. Lard. Shortening. Ghee. Bacon fat. Tropical oils, such as coconut, palm kernel, or palm oil. Seasoning and other foods Salted popcorn and pretzels. Onion salt, garlic salt, seasoned salt, table salt, and sea salt. Worcestershire sauce. Tartar sauce. Barbecue sauce. Teriyaki sauce. Soy sauce, including reduced-sodium. Steak sauce. Canned and packaged gravies. Fish sauce. Oyster sauce. Cocktail sauce. Horseradish that you find on the shelf. Ketchup. Mustard. Meat flavorings and tenderizers. Bouillon cubes. Hot sauce and Tabasco sauce. Premade or packaged marinades. Premade or packaged taco seasonings. Relishes. Regular salad dressings. Where to find more information:  National Heart, Lung, and Blood Institute: www.nhlbi.nih.gov  American Heart Association: www.heart.org Summary  The DASH eating plan is a healthy eating plan that has been shown to reduce high blood pressure (hypertension). It may also reduce your risk for type 2 diabetes, heart disease, and stroke.  With the DASH eating plan, you should limit salt (sodium) intake to 2,300 mg a day. If you have hypertension, you may need to reduce your sodium intake to 1,500 mg a day.  When on the DASH eating plan, aim to eat more fresh fruits and vegetables, whole grains, lean proteins, low-fat dairy, and heart-healthy fats.  Work with your health care provider or diet and nutrition specialist (dietitian) to adjust your eating plan to your   individual calorie needs. This information is not intended to replace advice given to you by your health care provider. Make sure you discuss any questions you have with your health care provider. Document Revised: 03/20/2017 Document Reviewed: 03/31/2016 Elsevier Patient Education  2020 [employer].  

## 2019-06-21 NOTE — Telephone Encounter (Signed)
Pt had visit with Dr Dorris Fetch 2/19 and was referred to Dr Harlow Asa for surgery. She has not heard from that office. Please advise and update pt, thanks

## 2019-06-21 NOTE — Progress Notes (Signed)
Established Patient Office Visit  Subjective:  Patient ID: Tracey Perez, female    DOB: Feb 27, 1966  Age: 54 y.o. MRN: [id number]  CC:  Chief Complaint  Patient presents with  . Hypertension  Patient consents to telephone visit and 2 patient identifiers was used to identify patient.  HPI Tracey Perez presents for follow up of hypertension and lab review. She states that she's compliant with her medications ,DASH diet and exercises as tolerated. Her blood pressure was elevated during her lab visit and she states that she has not checked it since then.  Her Lipid panel done on 06/15/2019, total cholesterol was 261, Triglycerides 239, HDL, 55 and LDL 162 mg/dl.   She followed up via telehealth with Dr Annett Gula N.W on 05/12/2019 and 06/10/2019 due to Nodular goiter. Per Dr Rudell Cobb note, she was found to have thyroid incidentaloma, subsequently confirmed to be multinodular goiter.  2.4 cm nodule on the right lobe was suggested for fine-needle aspiration.  Findings were consistent with atypia of undetermined significance. A sample was sent for Afirma molecular studies, results are consistent with 50% malignancy risk and she agrees for total thyroidectomy. She denies dysphagia, odynophagia, chest pain, palpitation, bruises, hematuria, hematochezia, fever and chills.  Overall, she states that she's doing well and offers no further complaint.  Past Medical History:  Diagnosis Date  . DVT (deep vein thrombosis) in pregnancy     Past Surgical History:  Procedure Laterality Date  . ABDOMINAL HYSTERECTOMY    . TONSILLECTOMY      Family History  Problem Relation Age of Onset  . Non-Hodgkin's lymphoma Mother   . Hypertension Father   . Depression Father   . Alcohol abuse Father   . Migraines Father   . Multiple sclerosis Sister   . Migraines Sister   . Migraines Brother   . Thyroid disease Brother   . Migraines Brother   . Thyroid disease Brother   . Thyroid disease  Brother   . Thyroid disease Brother     Social History   Socioeconomic History  . Marital status: Divorced    Spouse name: Not on file  . Number of children: Not on file  . Years of education: Not on file  . Highest education level: Not on file  Occupational History  . Not on file  Tobacco Use  . Smoking status: Former Smoker    Packs/day: 1.00    Years: 25.00    Pack years: 25.00    Types: Cigarettes    Quit date: 12/17/2011    Years since quitting: 7.5  . Smokeless tobacco: Never Used  Substance and Sexual Activity  . Alcohol use: Never  . Drug use: Never  . Sexual activity: Not on file  Other Topics Concern  . Not on file  Social History Narrative  . Not on file   Social Determinants of Health   Financial Resource Strain: Low Risk   . Difficulty of Paying Living Expenses: Not very hard  Food Insecurity: No Food Insecurity  . Worried About Charity fundraiser in the Last Year: Never true  . Ran Out of Food in the Last Year: Never true  Transportation Needs: No Transportation Needs  . Lack of Transportation (Medical): No  . Lack of Transportation (Non-Medical): No  Physical Activity:   . Days of Exercise per Week: Not on file  . Minutes of Exercise per Session: Not on file  Stress: No Stress Concern Present  . Feeling of Stress :  Not at all  Social Connections:   . Frequency of Communication with Friends and Family: Not on file  . Frequency of Social Gatherings with Friends and Family: Not on file  . Attends Religious Services: Not on file  . Active Member of Clubs or Organizations: Not on file  . Attends Archivist Meetings: Not on file  . Marital Status: Not on file  Intimate Partner Violence: Not At Risk  . Fear of Current or Ex-Partner: No  . Emotionally Abused: No  . Physically Abused: No  . Sexually Abused: No    Outpatient Medications Prior to Visit  Medication Sig Dispense Refill  . apixaban (ELIQUIS) 5 MG TABS tablet Two tabs po twice  a day for one week then one tab po twice a day afterwards (Patient taking differently: 5 mg. one tab po twice a day afterwards) 60 tablet 5  . Cholecalciferol (VITAMIN D) 125 MCG (5000 UT) CAPS Take 5,000 Units by mouth at bedtime.    . diphenhydrAMINE (BENADRYL) 25 mg capsule Take 100 mg by mouth at bedtime as needed for allergies or sleep. Patient taking 100 mg QHS as needed.    . gabapentin (NEURONTIN) 300 MG capsule Take 300 mg by mouth 3 (three) times daily.    . mirtazapine (REMERON) 30 MG tablet Take 15 mg by mouth daily.    Marland Kitchen oxycodone (ROXICODONE) 30 MG immediate release tablet Take 30 mg by mouth 5 (five) times daily.    Marland Kitchen tiZANidine (ZANAFLEX) 4 MG tablet Take 4 mg by mouth 3 (three) times daily as needed for muscle spasms. Takes 2 to 4 tablet once daily at night PRN    . venlafaxine XR (EFFEXOR-XR) 150 MG 24 hr capsule Take 300 mg by mouth daily.    Marland Kitchen zolpidem (AMBIEN) 10 MG tablet Take 10 mg by mouth at bedtime as needed for sleep.    . hydrALAZINE (APRESOLINE) 25 MG tablet Take 0.5 tablets (12.5 mg total) by mouth daily. 30 tablet 2  . Blood Pressure Monitoring (BLOOD PRESSURE KIT) KIT 1 kit by Does not apply route 2 (two) times daily. 1 kit 0  . fluticasone (FLONASE) 50 MCG/ACT nasal spray Place 1 spray into both nostrils daily. (Patient not taking: Reported on 06/06/2019) 16 g 2   No facility-administered medications prior to visit.    Allergies  Allergen Reactions  . Morphine And Related Hives  . Nsaids Other (See Comments)    Affects platelet count  . Sulfa Antibiotics Shortness Of Breath  . Tape Other (See Comments)    bruises skin  . Verapamil Swelling  . Lisinopril Other (See Comments)    Burning throat, metallic taste  . Other Itching    Pt states she is allergic to the plastic that IV bags are made of. Reaction causes itching relieved by Benadryl.   . Acetazolamide Other (See Comments)    Acid reflux   . Seroquel [Quetiapine Fumarate] Other (See Comments)     Muscle cramps    ROS Review of Systems  Constitutional: Negative.   HENT: Negative for trouble swallowing.   Eyes: Negative.   Respiratory: Negative.   Cardiovascular: Negative.   Neurological: Negative.   Hematological: Negative.   Psychiatric/Behavioral: Negative.       Objective:    Physical Exam No physical exam was done BP (!) 146/101 Comment: taken on 06/15/19  Pulse 95   Ht _0  (1.6 m)   Wt 176 lb 6.4 oz (80 kg)   SpO2 97%  BMI 31.25 kg/m  Wt Readings from Last 3 Encounters:  06/21/19 176 lb 6.4 oz (80 kg)  04/07/19 168 lb (76.2 kg)  03/22/19 169 lb 14.4 oz (77.1 kg)     Health Maintenance Due  Topic Date Due  . PAP SMEAR-Modifier  12/22/1985  . MAMMOGRAM  12/23/2014  . COLONOSCOPY  12/23/2014    There are no preventive care reminders to display for this patient.  Lab Results  Component Value Date   TSH 1.040 02/16/2019   Lab Results  Component Value Date   WBC 5.7 02/03/2019   HGB 11.6 (L) 02/03/2019   HCT 34.6 (L) 02/03/2019   MCV 82.4 02/03/2019   PLT 272 02/03/2019   Lab Results  Component Value Date   NA 143 02/03/2019   K 3.7 02/03/2019   CO2 24 02/03/2019   GLUCOSE 115 (H) 02/03/2019   BUN 13 02/03/2019   CREATININE 0.90 02/03/2019   CALCIUM 8.9 02/03/2019   ANIONGAP 9 02/03/2019   Lab Results  Component Value Date   CHOL 261 (H) 06/15/2019   Lab Results  Component Value Date   HDL 55 06/15/2019   Lab Results  Component Value Date   LDLCALC 162 (H) 06/15/2019   Lab Results  Component Value Date   TRIG 239 (H) 06/15/2019   Lab Results  Component Value Date   CHOLHDL 4.7 (H) 06/15/2019   Lab Results  Component Value Date   HGBA1C 5.6 06/15/2019      Assessment & Plan:   1. Essential hypertension - Her blood pressure is not controlled, her goal is less than 140/90. Her Hydralazine will be increased to 25 mg daily. She was advised to check her blood pressure 3 times weekly, record and bring log to follow up  appointment. Continue on DASH diet and exercise as tolerated.  - hydrALAZINE (APRESOLINE) 25 MG tablet; Take 1 tablet (25 mg total) by mouth daily.  Dispense: 90 tablet; Refill: 1  2. Thyroid nodule - She was advised to complete her charity care application and continue to follow up with Dr Jacqualyn Posey.  3. Elevated lipids - Her ASCVD 10 year risk was 4.2%, she was advised to control her blood pressure and continue on low fat/low cholesterol diet and exercise as tolerated.  - Will recheck Lipid panel; Future     Follow-up: Return in about 3 months (around 09/21/2019), or if symptoms worsen or fail to improve.    Amori Cooperman Jerold Coombe, NP

## 2019-06-21 NOTE — Telephone Encounter (Signed)
Called Dr.Gerkin's office they stated they would be calling her today. Pt made aware.

## 2019-06-30 ENCOUNTER — Telehealth: Payer: Self-pay | Admitting: Pharmacy Technician

## 2019-06-30 NOTE — Telephone Encounter (Signed)
Received updated proof of income.  Patient eligible to receive medication assistance at [facility] until time for re-certification in 9359, and as long as eligibility requirements continue to be met.  [facility]

## 2019-07-04 ENCOUNTER — Telehealth: Payer: Self-pay | Admitting: Pharmacist

## 2019-07-04 NOTE — Telephone Encounter (Signed)
07/04/2019 10:15:18 AM - Eliquis renewal faxed to [facility] - Monday, July 04, 2019 10:14 AM --Tracey Perez renewal for Eliquis 5mg  Take one tablet by mouth two times a day.

## 2019-07-07 ENCOUNTER — Other Ambulatory Visit: Payer: Self-pay

## 2019-07-07 DIAGNOSIS — I2699 Other pulmonary embolism without acute cor pulmonale: Secondary | ICD-10-CM

## 2019-07-07 MED ORDER — APIXABAN 5 MG PO TABS: 5.0000 mg | ORAL_TABLET | Freq: Two times a day (BID) | ORAL | 2 refills | Status: DC

## 2019-07-20 ENCOUNTER — Ambulatory Visit: Payer: Self-pay | Admitting: Surgery

## 2019-07-21 ENCOUNTER — Encounter: Payer: Self-pay | Admitting: Oncology

## 2019-07-25 ENCOUNTER — Ambulatory Visit: Payer: MEDICAID | Admitting: "Endocrinology

## 2019-07-28 ENCOUNTER — Telehealth: Payer: Self-pay

## 2019-07-28 NOTE — Telephone Encounter (Signed)
Mammie Lorenzo, RN from [facility] wanting to know what if they could stop his Eliquis for his upcoming surgery on 08/10/2019. Waiting for Dr. Elroy Channel response and then I will call Claiborne Billings to let her know.

## 2019-08-01 ENCOUNTER — Inpatient Hospital Stay: Payer: Self-pay | Attending: Oncology

## 2019-08-01 ENCOUNTER — Other Ambulatory Visit: Payer: Self-pay

## 2019-08-01 DIAGNOSIS — D509 Iron deficiency anemia, unspecified: Secondary | ICD-10-CM | POA: Insufficient documentation

## 2019-08-01 DIAGNOSIS — Z86711 Personal history of pulmonary embolism: Secondary | ICD-10-CM | POA: Insufficient documentation

## 2019-08-01 LAB — CBC WITH DIFFERENTIAL/PLATELET
Abs Immature Granulocytes: 0.03 10*3/uL (ref 0.00–0.07)
Basophils Absolute: 0 10*3/uL (ref 0.0–0.1)
Basophils Relative: 0 %
Eosinophils Absolute: 0.1 10*3/uL (ref 0.0–0.5)
Eosinophils Relative: 2 %
HCT: 40.4 % (ref 36.0–46.0)
Hemoglobin: 13.5 g/dL (ref 12.0–15.0)
Immature Granulocytes: 0 %
Lymphocytes Relative: 21 %
Lymphs Abs: 1.6 10*3/uL (ref 0.7–4.0)
MCH: 27.3 pg (ref 26.0–34.0)
MCHC: 33.4 g/dL (ref 30.0–36.0)
MCV: 81.6 fL (ref 80.0–100.0)
Monocytes Absolute: 0.3 10*3/uL (ref 0.1–1.0)
Monocytes Relative: 4 %
Neutro Abs: 5.3 10*3/uL (ref 1.7–7.7)
Neutrophils Relative %: 73 %
Platelets: 322 10*3/uL (ref 150–400)
RBC: 4.95 MIL/uL (ref 3.87–5.11)
RDW: 12.9 % (ref 11.5–15.5)
WBC: 7.3 10*3/uL (ref 4.0–10.5)
nRBC: 0 % (ref 0.0–0.2)

## 2019-08-01 LAB — FERRITIN: Ferritin: 47 ng/mL (ref 11–307)

## 2019-08-02 ENCOUNTER — Encounter (HOSPITAL_COMMUNITY): Payer: Self-pay | Admitting: Surgery

## 2019-08-02 ENCOUNTER — Telehealth: Payer: Self-pay

## 2019-08-02 DIAGNOSIS — D440 Neoplasm of uncertain behavior of thyroid gland: Secondary | ICD-10-CM | POA: Diagnosis present

## 2019-08-02 DIAGNOSIS — E042 Nontoxic multinodular goiter: Secondary | ICD-10-CM | POA: Diagnosis present

## 2019-08-02 NOTE — H&P (Signed)
General Surgery [facility]  Tracey Perez DOB: 1966/03/05 Divorced / Language: Cleophus Molt / Race: White Female   History of Present Illness   The patient is a 54 year old female who presents with a thyroid nodule.  CHIEF COMPLAINT: thyroid neoplasm of uncertain behavior, multiple thyroid nodules  Patient is referred by Dr. Bud Face for surgical evaluation and management of newly diagnosed thyroid neoplasm of uncertain behavior. Patient had been undergoing a CT scan of the chest for evaluation of pulmonary emboli. Patient has a history of DVT and pulmonary embolism and is on long-term anticoagulation. Incidental finding was made of a right sided thyroid nodule. Patient subsequently underwent thyroid ultrasound on April 18, 2019. We reviewed this report today in detail and I provided the patient with a copy. This shows a dominant nodule in the right thyroid lobe measuring 2.4 cm in greatest dimension. It was felt to be moderately suspicious. An additional 1.4 cm nodule was sponge performed in the right thyroid lobe. Left thyroid lobe had no significant or worrisome findings. Patient underwent fine-needle aspiration biopsy on May 04, 2019. This showed cytologic atypia, Bethesda category III. The specimen was subsequently sent for molecular genetic testing, AFIRMA. The result was suspicious, rendering a 50% risk of malignancy. Patient is referred today to discuss surgical resection for definitive diagnosis and management. Patient has had no prior head or neck surgery. She has never been on thyroid medication. There is a family history of thyroid disease in 2 brothers who had benign disease but both of them required surgery. Patient is on chronic anticoagulation and will require a Lovenox bridge around the time of her surgical procedure. We will be in contact with her hematologist at [facility]. Patient denies any tremors or palpitations.  She denies any compressive symptoms. She is accompanied today by her daughter.   Past Surgical History Cesarean Section - 1  Hysterectomy (not due to cancer) - Complete  Tonsillectomy   Diagnostic Studies History  Colonoscopy  5-10 years ago Mammogram  >3 years ago Pap Smear  >5 years ago  Allergies  Morphine Derivatives  Hives. NSAIDs  Sulfa Drugs  Shortness of breath. Adhesive Tape  Verapamil HCl *CALCIUM CHANNEL BLOCKERS*  Swelling. Lisinopril *ANTIHYPERTENSIVES*  acetaZOLAMIDE *DIURETICS*  SEROquel *ANTIPSYCHOTICS/ANTIMANIC AGENTS*  muscle cramping Allergies Reconciled   Medication History Mammie Lorenzo, LPN; 624THL 624THL AM) oxyCODONE HCl (30MG  Tablet, Oral) Active. Zolpidem Tartrate (10MG  Tablet, Oral) Active. Eliquis (5MG  Tablet, Oral) Active. Mirtazapine (30MG  Tablet, Oral) Active. tiZANidine HCl (4MG  Tablet, Oral) Active. Venlafaxine HCl ER (150MG  Capsule ER 24HR, Oral) Active. Gabapentin (300MG  Capsule, Oral) Active. hydrALAZINE HCl (25MG  Tablet, Oral) Active. Medications Reconciled  Social History Alcohol use  Remotely quit alcohol use. Caffeine use  Carbonated beverages, Tea. No drug use  Tobacco use  Former smoker.  Family History  Alcohol Abuse  Father. Arthritis  Father. Cancer  Mother. Depression  Father. Family history unknown  First Degree Relatives  Heart Disease  Father. Hypertension  Father. Kidney Disease  Brother. Melanoma  Father. Migraine Headache  Brother, Daughter, Father, Sister. Respiratory Condition  Father. Thyroid problems  Brother.  Pregnancy / Birth History  Age at menarche  56 years. Age of menopause  <44 Contraceptive History  Contraceptive implant, Oral contraceptives. Gravida  1 Irregular periods  Length (months) of breastfeeding  3-6 Maternal age  38-20 Para  1  Other Problems Anxiety Disorder  Arthritis  Back Pain  Depression  General anesthesia -  complications  Hemorrhoids  High blood pressure  Migraine Headache  Oophorectomy  Bilateral. Pulmonary Embolism / Blood Clot in Legs     Review of Systems  General Present- Fatigue and Weight Gain. Not Present- Appetite Loss, Chills, Fever, Night Sweats and Weight Loss. Skin Not Present- Change in Wart/Mole, Dryness, Hives, Jaundice, New Lesions, Non-Healing Wounds, Rash and Ulcer. HEENT Present- Seasonal Allergies and Wears glasses/contact lenses. Not Present- Earache, Hearing Loss, Hoarseness, Nose Bleed, Oral Ulcers, Ringing in the Ears, Sinus Pain, Sore Throat, Visual Disturbances and Yellow Eyes. Respiratory Not Present- Bloody sputum, Chronic Cough, Difficulty Breathing, Snoring and Wheezing. Breast Not Present- Breast Mass, Breast Pain, Nipple Discharge and Skin Changes. Cardiovascular Present- Swelling of Extremities. Not Present- Chest Pain, Difficulty Breathing Lying Down, Leg Cramps, Palpitations, Rapid Heart Rate and Shortness of Breath. Gastrointestinal Present- Hemorrhoids. Not Present- Abdominal Pain, Bloating, Bloody Stool, Change in Bowel Habits, Chronic diarrhea, Constipation, Difficulty Swallowing, Excessive gas, Gets full quickly at meals, Indigestion, Nausea, Rectal Pain and Vomiting. Female Genitourinary Not Present- Frequency, Nocturia, Painful Urination, Pelvic Pain and Urgency. Musculoskeletal Present- Back Pain. Not Present- Joint Pain, Joint Stiffness, Muscle Pain, Muscle Weakness and Swelling of Extremities. Neurological Present- Headaches. Not Present- Decreased Memory, Fainting, Numbness, Seizures, Tingling, Tremor, Trouble walking and Weakness. Psychiatric Present- Anxiety and Depression. Not Present- Bipolar, Change in Sleep Pattern, Fearful and Frequent crying. Endocrine Not Present- Cold Intolerance, Excessive Hunger, Hair Changes, Heat Intolerance, Hot flashes and New Diabetes. Hematology Present- Blood Thinners and Easy Bruising. Not Present- Excessive  bleeding, Gland problems, HIV and Persistent Infections.  Vitals  Weight: 182.6 lb Height: 63in Body Surface Area: 1.86 m Body Mass Index: 32.35 kg/m  Temp.: 98.20F(Thermal Scan)  Pulse: 106 (Regular)  BP: 148/88 (Sitting, Left Arm, Standard)  Physical Exam  GENERAL APPEARANCE Development: normal Nutritional status: normal Gross deformities: none  SKIN Rash, lesions, ulcers: none Induration, erythema: none Nodules: none palpable  EYES Conjunctiva and lids: normal Pupils: equal and reactive Iris: normal bilaterally  EARS, NOSE, MOUTH, THROAT External ears: no lesion or deformity External nose: no lesion or deformity Hearing: grossly normal Due to Covid-19 pandemic, patient is wearing a mask.  NECK Symmetric: yes Trachea: midline Thyroid: On palpation there is fullness in the right thyroid lobe, mild to moderate firmness, no tenderness. There are no discrete or dominant masses palpable. Left thyroid lobe is without palpable abnormality. There is no associated lymphadenopathy in the neck.  CHEST Respiratory effort: normal Retraction or accessory muscle use: no Breath sounds: normal bilaterally Rales, rhonchi, wheeze: none  CARDIOVASCULAR Auscultation: regular rhythm, normal rate Murmurs: none Pulses: radial pulse 2+ palpable Lower extremity edema: none  MUSCULOSKELETAL Station and gait: normal Digits and nails: no clubbing or cyanosis Muscle strength: grossly normal all extremities Range of motion: grossly normal all extremities Deformity: none  LYMPHATIC Cervical: none palpable Supraclavicular: none palpable  PSYCHIATRIC Oriented to person, place, and time: yes Mood and affect: normal for situation Judgment and insight: appropriate for situation    Assessment & Plan  NEOPLASM OF UNCERTAIN BEHAVIOR OF THYROID GLAND (D44.0) MULTIPLE THYROID NODULES (E04.2)  Patient presents on referral from her endocrinologist for surgical evaluation  and management of a thyroid neoplasm of uncertain behavior and multiple thyroid nodules. She is accompanied by her daughter.  Patient provided with a copy of "The Thyroid Book: Medical and Surgical Treatment of Thyroid Problems", published by [employer], 16 pages. Book reviewed and explained to patient during visit today.  We discussed the results of her diagnostic studies including her CT scan, her  ultrasound examination, her fine-needle aspiration biopsy, and her molecular genetic testing. We discussed the option of thyroid lobectomy versus total thyroidectomy. We discussed the risk and benefits of each procedure. I have recommended proceeding with right thyroid lobectomy. We discussed the risk of recurrent laryngeal nerve injury and injury to parathyroid glands. We discussed the size and location of the surgical incision. We discussed the hospital stay to be anticipated. We discussed the potential need for additional surgery. We discussed the potential need for treatment with thyroid medication long-term. We discussed the potential need for radioactive iodine treatment. The patient and her family understand and wish to proceed with surgery in the near future.  The risks and benefits of the procedure have been discussed at length with the patient. The patient understands the proposed procedure, potential alternative treatments, and the course of recovery to be expected. All of the patient's questions have been answered at this time. The patient wishes to proceed with surgery.   Armandina Gemma, MD [facility] Office: [phone]

## 2019-08-02 NOTE — Telephone Encounter (Signed)
Called to let Tracey Perez know that patient needs to stop her eliquis 4/16 with no bridging and can restart soon after surgery

## 2019-08-03 NOTE — Telephone Encounter (Signed)
[place] contacted [place] to give [place] Dr. Elroy Channel instruction to be given to patient.

## 2019-08-04 ENCOUNTER — Other Ambulatory Visit (HOSPITAL_COMMUNITY)
Admission: RE | Admit: 2019-08-04 | Discharge: 2019-08-04 | Disposition: A | Discharge status: Home / Self Care | Payer: HRSA Program | Source: Ambulatory Visit | Attending: Surgery | Admitting: Surgery

## 2019-08-04 DIAGNOSIS — Z20822 Contact with and (suspected) exposure to covid-19: Secondary | ICD-10-CM | POA: Insufficient documentation

## 2019-08-04 DIAGNOSIS — Z01812 Encounter for preprocedural laboratory examination: Secondary | ICD-10-CM | POA: Insufficient documentation

## 2019-08-04 LAB — SARS CORONAVIRUS 2 (TAT 6-24 HRS): SARS Coronavirus 2: NEGATIVE

## 2019-08-05 ENCOUNTER — Encounter (HOSPITAL_COMMUNITY): Payer: Self-pay | Admitting: Surgery

## 2019-08-05 ENCOUNTER — Other Ambulatory Visit: Payer: Self-pay

## 2019-08-05 NOTE — Progress Notes (Addendum)
Patient reports receving Eliquis instructions from Dr Janese Banks( hematologist ) .  Last dose 08/05/19 per patient.  Telephone note in epic dated 08/02/19 with Eliquis instructions.

## 2019-08-05 NOTE — Progress Notes (Addendum)
Anesthesia Review:  PCP: Hematologist- Dr Janese Banks- follows Eliquis  LOV 03/10/19-epic  [place]- [place] epic-06/21/19  Cardiologist :none  Chest x-ray :02/02/19 - Ct angio chest  EKG :02/02/19  Echo :02/03/19  Cardiac Cath :  Sleep Study/ CPAP :yes but does not use cpap per patient  Fasting Blood Sugar :      / Checks Blood Sugar -- times a day:   Blood Thinner/ Instructions /Last Dose: Eliquis last dose 08/05/19 per patient  Telephone note 08/02/19 in epic with instructions  ASA / Instructions/ Last Dose :   Patient denies shortness of breath, chest pain, fever, and cough at this phone interview. Hx of Ferritin anemia, DVT- 20 years ago  Last pe- 01/2019 Hospitalized at [facility] for one day per patient

## 2019-08-07 ENCOUNTER — Encounter (HOSPITAL_COMMUNITY): Payer: Self-pay | Admitting: Surgery

## 2019-08-07 NOTE — Anesthesia Preprocedure Evaluation (Addendum)
Anesthesia Evaluation  Patient identified by MRN, date of birth, ID band  History of Anesthesia Complications (+) PONV and history of anesthetic complications  Airway Mallampati: II  TM Distance: >3 FB Neck ROM: Full    Dental no notable dental hx. (+) Teeth Intact   Pulmonary sleep apnea , former smoker, PE   Pulmonary exam normal breath sounds clear to auscultation       Cardiovascular hypertension, Pt. on medications + DVT  Normal cardiovascular exam Rhythm:Regular Rate:Normal  Benign Paroxysmal Positional Vertigo   Neuro/Psych  Headaches, PSYCHIATRIC DISORDERS Anxiety Depression Generalized social phobiaHx/o PTE 10/20  Neuromuscular disease    GI/Hepatic negative GI ROS, Neg liver ROS,   Endo/Other  negative endocrine ROSThyroid neoplasm of uncertain behavior Multiple thyroid nodules Hyperlipidemia  Renal/GU negative Renal ROS  negative genitourinary   Musculoskeletal  (+) Arthritis , Osteoarthritis,  DDD lumbar spine Chronic SI joint pain   Abdominal Normal abdominal exam  (+) - obese,   Peds  Hematology  (+) anemia , Eliquis therapy- last dose 08/06/2019   Anesthesia Other Findings   Reproductive/Obstetrics                          Anesthesia Physical Anesthesia Plan  ASA: III  Anesthesia Plan: General   Post-op Pain Management:    Induction:   PONV Risk Score and Plan: 4 or greater and Ondansetron, Treatment may vary due to age or medical condition, Scopolamine patch - Pre-op, Dexamethasone and Midazolam  Airway Management Planned: Oral ETT  Additional Equipment:   Intra-op Plan:   Post-operative Plan: Extubation in OR  Informed Consent: I have reviewed the patients History and Physical, chart, labs and discussed the procedure including the risks, benefits and alternatives for the proposed anesthesia with the patient or authorized representative who has indicated his/her  understanding and acceptance.     Dental advisory given  Plan Discussed with: CRNA and Surgeon  Anesthesia Plan Comments:        Anesthesia Quick Evaluation

## 2019-08-08 ENCOUNTER — Ambulatory Visit (HOSPITAL_COMMUNITY): Payer: Self-pay | Admitting: Physician Assistant

## 2019-08-08 ENCOUNTER — Other Ambulatory Visit: Payer: Self-pay

## 2019-08-08 ENCOUNTER — Ambulatory Visit (HOSPITAL_COMMUNITY)
Admission: RE | Admit: 2019-08-08 | Discharge: 2019-08-09 | Disposition: A | Discharge status: Home / Self Care | Payer: Self-pay | Attending: Surgery | Admitting: Surgery

## 2019-08-08 ENCOUNTER — Encounter (HOSPITAL_COMMUNITY): Payer: Self-pay | Admitting: Surgery

## 2019-08-08 ENCOUNTER — Encounter (HOSPITAL_COMMUNITY)
Admission: RE | Disposition: A | Discharge status: Home / Self Care | Payer: Self-pay | Source: Home / Self Care | Attending: Surgery

## 2019-08-08 ENCOUNTER — Ambulatory Visit (HOSPITAL_COMMUNITY): Payer: Self-pay

## 2019-08-08 DIAGNOSIS — Z87891 Personal history of nicotine dependence: Secondary | ICD-10-CM | POA: Insufficient documentation

## 2019-08-08 DIAGNOSIS — G4730 Sleep apnea, unspecified: Secondary | ICD-10-CM | POA: Insufficient documentation

## 2019-08-08 DIAGNOSIS — Z8249 Family history of ischemic heart disease and other diseases of the circulatory system: Secondary | ICD-10-CM | POA: Insufficient documentation

## 2019-08-08 DIAGNOSIS — E669 Obesity, unspecified: Secondary | ICD-10-CM | POA: Insufficient documentation

## 2019-08-08 DIAGNOSIS — D489 Neoplasm of uncertain behavior, unspecified: Secondary | ICD-10-CM | POA: Insufficient documentation

## 2019-08-08 DIAGNOSIS — Z8349 Family history of other endocrine, nutritional and metabolic diseases: Secondary | ICD-10-CM | POA: Insufficient documentation

## 2019-08-08 DIAGNOSIS — Z01818 Encounter for other preprocedural examination: Secondary | ICD-10-CM

## 2019-08-08 DIAGNOSIS — D649 Anemia, unspecified: Secondary | ICD-10-CM | POA: Insufficient documentation

## 2019-08-08 DIAGNOSIS — Z86718 Personal history of other venous thrombosis and embolism: Secondary | ICD-10-CM | POA: Insufficient documentation

## 2019-08-08 DIAGNOSIS — Z6832 Body mass index (BMI) 32.0-32.9, adult: Secondary | ICD-10-CM | POA: Insufficient documentation

## 2019-08-08 DIAGNOSIS — E042 Nontoxic multinodular goiter: Secondary | ICD-10-CM | POA: Diagnosis present

## 2019-08-08 DIAGNOSIS — F419 Anxiety disorder, unspecified: Secondary | ICD-10-CM | POA: Insufficient documentation

## 2019-08-08 DIAGNOSIS — D440 Neoplasm of uncertain behavior of thyroid gland: Secondary | ICD-10-CM | POA: Diagnosis present

## 2019-08-08 DIAGNOSIS — Z86711 Personal history of pulmonary embolism: Secondary | ICD-10-CM | POA: Insufficient documentation

## 2019-08-08 DIAGNOSIS — M1990 Unspecified osteoarthritis, unspecified site: Secondary | ICD-10-CM | POA: Insufficient documentation

## 2019-08-08 DIAGNOSIS — G709 Myoneural disorder, unspecified: Secondary | ICD-10-CM | POA: Insufficient documentation

## 2019-08-08 DIAGNOSIS — Z7901 Long term (current) use of anticoagulants: Secondary | ICD-10-CM | POA: Insufficient documentation

## 2019-08-08 DIAGNOSIS — G43909 Migraine, unspecified, not intractable, without status migrainosus: Secondary | ICD-10-CM | POA: Insufficient documentation

## 2019-08-08 DIAGNOSIS — F329 Major depressive disorder, single episode, unspecified: Secondary | ICD-10-CM | POA: Insufficient documentation

## 2019-08-08 DIAGNOSIS — I10 Essential (primary) hypertension: Secondary | ICD-10-CM | POA: Insufficient documentation

## 2019-08-08 HISTORY — DX: Headache, unspecified: R51.9

## 2019-08-08 HISTORY — DX: Nausea with vomiting, unspecified: R11.2

## 2019-08-08 HISTORY — DX: Essential (primary) hypertension: I10

## 2019-08-08 HISTORY — DX: Other pulmonary embolism without acute cor pulmonale: I26.99

## 2019-08-08 HISTORY — PX: THYROID LOBECTOMY: SHX420

## 2019-08-08 HISTORY — DX: Other complications of anesthesia, initial encounter: T88.59XA

## 2019-08-08 HISTORY — DX: Anxiety disorder, unspecified: F41.9

## 2019-08-08 HISTORY — DX: Sleep apnea, unspecified: G47.30

## 2019-08-08 HISTORY — DX: Depression, unspecified: F32.A

## 2019-08-08 HISTORY — DX: Anemia, unspecified: D64.9

## 2019-08-08 HISTORY — DX: Other specified postprocedural states: Z98.890

## 2019-08-08 LAB — BASIC METABOLIC PANEL
Anion gap: 12 (ref 5–15)
BUN: 15 mg/dL (ref 6–20)
CO2: 25 mmol/L (ref 22–32)
Calcium: 9.9 mg/dL (ref 8.9–10.3)
Chloride: 104 mmol/L (ref 98–111)
Creatinine, Ser: 0.94 mg/dL (ref 0.44–1.00)
GFR calc Af Amer: >60 mL/min (ref >60)
GFR calc non Af Amer: >60 mL/min (ref >60)
Glucose, Bld: 97 mg/dL (ref 70–99)
Potassium: 3.8 mmol/L (ref 3.5–5.1)
Sodium: 141 mmol/L (ref 135–145)

## 2019-08-08 SURGERY — LOBECTOMY, THYROID
Anesthesia: General | Laterality: Right

## 2019-08-08 MED ORDER — ONDANSETRON 4 MG PO TBDP: 4.0000 mg | ORAL_TABLET | Freq: Four times a day (QID) | ORAL | Status: DC | PRN

## 2019-08-08 MED ORDER — HYDROMORPHONE HCL 1 MG/ML IJ SOLN
INTRAMUSCULAR | Status: AC
  Filled 2019-08-08: qty 1

## 2019-08-08 MED ORDER — MIRTAZAPINE 15 MG PO TABS
15.0000 mg | ORAL_TABLET | Freq: Every day | ORAL | Status: DC
  Administered 2019-08-08: 21:00:00 15 mg via ORAL
  Filled 2019-08-08: qty 1

## 2019-08-08 MED ORDER — ONDANSETRON HCL 4 MG/2ML IJ SOLN: 4.0000 mg | Freq: Once | INTRAMUSCULAR | Status: DC | PRN

## 2019-08-08 MED ORDER — 0.9 % SODIUM CHLORIDE (POUR BTL) OPTIME
TOPICAL | Status: DC | PRN
  Administered 2019-08-08: 10:00:00 1000 mL

## 2019-08-08 MED ORDER — ZOLPIDEM TARTRATE 5 MG PO TABS
5.0000 mg | ORAL_TABLET | Freq: Every day | ORAL | Status: DC
  Administered 2019-08-08: 5 mg via ORAL
  Filled 2019-08-08: qty 1

## 2019-08-08 MED ORDER — PROPOFOL 10 MG/ML IV BOLUS
INTRAVENOUS | Status: AC
  Filled 2019-08-08: qty 20

## 2019-08-08 MED ORDER — CHLORHEXIDINE GLUCONATE CLOTH 2 % EX PADS: 6.0000 | MEDICATED_PAD | Freq: Once | CUTANEOUS | Status: DC

## 2019-08-08 MED ORDER — DEXAMETHASONE SODIUM PHOSPHATE 10 MG/ML IJ SOLN
INTRAMUSCULAR | Status: DC | PRN
  Administered 2019-08-08: 10 mg via INTRAVENOUS

## 2019-08-08 MED ORDER — FENTANYL CITRATE (PF) 100 MCG/2ML IJ SOLN
25.0000 ug | INTRAMUSCULAR | Status: DC | PRN
  Administered 2019-08-08 (×2): 50 ug via INTRAVENOUS

## 2019-08-08 MED ORDER — FENTANYL CITRATE (PF) 100 MCG/2ML IJ SOLN
INTRAMUSCULAR | Status: AC
  Filled 2019-08-08: qty 2

## 2019-08-08 MED ORDER — LACTATED RINGERS IV SOLN: INTRAVENOUS | Status: DC

## 2019-08-08 MED ORDER — ONDANSETRON HCL 4 MG/2ML IJ SOLN: 4.0000 mg | Freq: Four times a day (QID) | INTRAMUSCULAR | Status: DC | PRN

## 2019-08-08 MED ORDER — KCL IN DEXTROSE-NACL 20-5-0.45 MEQ/L-%-% IV SOLN
INTRAVENOUS | Status: DC
  Filled 2019-08-08: qty 1000

## 2019-08-08 MED ORDER — FENTANYL CITRATE (PF) 100 MCG/2ML IJ SOLN
INTRAMUSCULAR | Status: AC
  Administered 2019-08-08: 50 ug via INTRAVENOUS
  Filled 2019-08-08: qty 2

## 2019-08-08 MED ORDER — TRAMADOL HCL 50 MG PO TABS: 50.0000 mg | ORAL_TABLET | Freq: Four times a day (QID) | ORAL | Status: DC | PRN

## 2019-08-08 MED ORDER — GABAPENTIN 300 MG PO CAPS
300.0000 mg | ORAL_CAPSULE | Freq: Three times a day (TID) | ORAL | Status: DC
  Administered 2019-08-08 – 2019-08-09 (×3): 300 mg via ORAL
  Filled 2019-08-08 (×3): qty 1

## 2019-08-08 MED ORDER — OXYCODONE HCL 5 MG PO TABS
5.0000 mg | ORAL_TABLET | ORAL | Status: DC | PRN
  Administered 2019-08-08 – 2019-08-09 (×4): 10 mg via ORAL
  Filled 2019-08-08 (×4): qty 2

## 2019-08-08 MED ORDER — ROCURONIUM BROMIDE 10 MG/ML (PF) SYRINGE
PREFILLED_SYRINGE | INTRAVENOUS | Status: DC | PRN
  Administered 2019-08-08: 60 mg via INTRAVENOUS

## 2019-08-08 MED ORDER — FENTANYL CITRATE (PF) 100 MCG/2ML IJ SOLN
INTRAMUSCULAR | Status: DC | PRN
  Administered 2019-08-08: 100 ug via INTRAVENOUS
  Administered 2019-08-08 (×2): 25 ug via INTRAVENOUS
  Administered 2019-08-08: 50 ug via INTRAVENOUS

## 2019-08-08 MED ORDER — HYDRALAZINE HCL 25 MG PO TABS
25.0000 mg | ORAL_TABLET | Freq: Every evening | ORAL | Status: DC
  Administered 2019-08-08: 25 mg via ORAL
  Filled 2019-08-08: qty 1

## 2019-08-08 MED ORDER — DIPHENHYDRAMINE HCL 50 MG/ML IJ SOLN
25.0000 mg | Freq: Once | INTRAMUSCULAR | Status: AC
  Administered 2019-08-08: 25 mg via INTRAVENOUS
  Filled 2019-08-08: qty 1

## 2019-08-08 MED ORDER — ESMOLOL HCL 100 MG/10ML IV SOLN
INTRAVENOUS | Status: AC
  Filled 2019-08-08: qty 10

## 2019-08-08 MED ORDER — VENLAFAXINE HCL ER 150 MG PO CP24
300.0000 mg | ORAL_CAPSULE | Freq: Every day | ORAL | Status: DC
  Administered 2019-08-09: 300 mg via ORAL
  Filled 2019-08-08: qty 2

## 2019-08-08 MED ORDER — OXYCODONE HCL 5 MG/5ML PO SOLN: 5.0000 mg | Freq: Once | ORAL | Status: DC | PRN

## 2019-08-08 MED ORDER — PROPOFOL 10 MG/ML IV BOLUS
INTRAVENOUS | Status: DC | PRN
  Administered 2019-08-08: 150 mg via INTRAVENOUS

## 2019-08-08 MED ORDER — VANCOMYCIN HCL IN DEXTROSE 1-5 GM/200ML-% IV SOLN
1000.0000 mg | INTRAVENOUS | Status: AC
  Administered 2019-08-08: 1000 mg via INTRAVENOUS
  Filled 2019-08-08: qty 200

## 2019-08-08 MED ORDER — SCOPOLAMINE 1 MG/3DAYS TD PT72
1.0000 | MEDICATED_PATCH | TRANSDERMAL | Status: DC
  Administered 2019-08-08: 1.5 mg via TRANSDERMAL
  Filled 2019-08-08: qty 1

## 2019-08-08 MED ORDER — KCL IN DEXTROSE-NACL 20-5-0.45 MEQ/L-%-% IV SOLN
INTRAVENOUS | Status: AC
  Filled 2019-08-08: qty 1000

## 2019-08-08 MED ORDER — LIDOCAINE 2% (20 MG/ML) 5 ML SYRINGE
INTRAMUSCULAR | Status: DC | PRN
  Administered 2019-08-08: 80 mg via INTRAVENOUS

## 2019-08-08 MED ORDER — PHENYLEPHRINE 40 MCG/ML (10ML) SYRINGE FOR IV PUSH (FOR BLOOD PRESSURE SUPPORT)
PREFILLED_SYRINGE | INTRAVENOUS | Status: DC | PRN
  Administered 2019-08-08: 80 ug via INTRAVENOUS

## 2019-08-08 MED ORDER — ACETAMINOPHEN 650 MG RE SUPP: 650.0000 mg | Freq: Four times a day (QID) | RECTAL | Status: DC | PRN

## 2019-08-08 MED ORDER — PROPOFOL 500 MG/50ML IV EMUL
INTRAVENOUS | Status: DC | PRN
  Administered 2019-08-08: 50 ug/kg/min via INTRAVENOUS

## 2019-08-08 MED ORDER — ONDANSETRON HCL 4 MG/2ML IJ SOLN
INTRAMUSCULAR | Status: DC | PRN
  Administered 2019-08-08: 4 mg via INTRAVENOUS

## 2019-08-08 MED ORDER — OXYCODONE HCL 5 MG PO TABS: 5.0000 mg | ORAL_TABLET | Freq: Once | ORAL | Status: DC | PRN

## 2019-08-08 MED ORDER — MIDAZOLAM HCL 5 MG/5ML IJ SOLN
INTRAMUSCULAR | Status: DC | PRN
  Administered 2019-08-08: 2 mg via INTRAVENOUS

## 2019-08-08 MED ORDER — ACETAMINOPHEN 325 MG PO TABS: 650.0000 mg | ORAL_TABLET | Freq: Four times a day (QID) | ORAL | Status: DC | PRN

## 2019-08-08 MED ORDER — HYDROMORPHONE HCL 1 MG/ML IJ SOLN
1.0000 mg | INTRAMUSCULAR | Status: DC | PRN
  Administered 2019-08-08 (×4): 1 mg via INTRAVENOUS
  Filled 2019-08-08 (×3): qty 1

## 2019-08-08 MED ORDER — MIDAZOLAM HCL 2 MG/2ML IJ SOLN
INTRAMUSCULAR | Status: AC
  Filled 2019-08-08: qty 2

## 2019-08-08 SURGICAL SUPPLY — 29 items
ATTRACTOMAT 16X20 MAGNETIC DRP (DRAPES) ×3 IMPLANT
BLADE SURG 15 STRL LF DISP TIS (BLADE) ×1 IMPLANT
BLADE SURG 15 STRL SS (BLADE) ×2
CHLORAPREP W/TINT 26 (MISCELLANEOUS) ×3 IMPLANT
CLIP VESOCCLUDE MED 6/CT (CLIP) ×6 IMPLANT
CLIP VESOCCLUDE SM WIDE 6/CT (CLIP) ×12 IMPLANT
COVER SURGICAL LIGHT HANDLE (MISCELLANEOUS) ×3 IMPLANT
COVER WAND RF STERILE (DRAPES) ×3 IMPLANT
DERMABOND ADVANCED (GAUZE/BANDAGES/DRESSINGS) ×2
DERMABOND ADVANCED .7 DNX12 (GAUZE/BANDAGES/DRESSINGS) ×1 IMPLANT
DRAPE LAPAROTOMY T 98X78 PEDS (DRAPES) ×3 IMPLANT
ELECT REM PT RETURN 15FT ADLT (MISCELLANEOUS) ×3 IMPLANT
GAUZE 4X4 16PLY RFD (DISPOSABLE) ×3 IMPLANT
GLOVE SURG ORTHO 8.0 STRL STRW (GLOVE) ×3 IMPLANT
GOWN STRL REUS W/TWL XL LVL3 (GOWN DISPOSABLE) ×6 IMPLANT
HEMOSTAT SURGICEL 2X4 FIBR (HEMOSTASIS) ×3 IMPLANT
ILLUMINATOR WAVEGUIDE N/F (MISCELLANEOUS) ×3 IMPLANT
KIT BASIN OR (CUSTOM PROCEDURE TRAY) ×3 IMPLANT
KIT TURNOVER KIT A (KITS) IMPLANT
PACK BASIC VI WITH GOWN DISP (CUSTOM PROCEDURE TRAY) ×3 IMPLANT
PENCIL SMOKE EVACUATOR (MISCELLANEOUS) IMPLANT
SHEARS HARMONIC 9CM CVD (BLADE) ×3 IMPLANT
SUT MNCRL AB 4-0 PS2 18 (SUTURE) ×3 IMPLANT
SUT VIC AB 3-0 SH 18 (SUTURE) ×6 IMPLANT
SYR BULB IRRIGATION 50ML (SYRINGE) ×3 IMPLANT
TOWEL OR 17X26 10 PK STRL BLUE (TOWEL DISPOSABLE) ×3 IMPLANT
TOWEL OR NON WOVEN STRL DISP B (DISPOSABLE) ×3 IMPLANT
TUBING CONNECTING 10 (TUBING) ×2 IMPLANT
TUBING CONNECTING 10' (TUBING) ×1

## 2019-08-08 NOTE — Anesthesia Postprocedure Evaluation (Signed)
Anesthesia Post Note  Patient: Tracey Perez  Procedure(s) Performed: RIGHT THYROID LOBECTOMY (Right )     Patient location during evaluation: PACU Anesthesia Type: General Level of consciousness: awake and alert and oriented Pain management: pain level controlled Vital Signs Assessment: post-procedure vital signs reviewed and stable Respiratory status: spontaneous breathing, nonlabored ventilation and respiratory function stable Cardiovascular status: blood pressure returned to baseline and stable Postop Assessment: no apparent nausea or vomiting Anesthetic complications: no    Last Vitals:  Vitals:   08/08/19 1115 08/08/19 1130  BP: (!) 155/101 (!) 166/94  Pulse: (!) 102 (!) 101  Resp: 19 13  Temp:    SpO2: 99% 96%    Last Pain:  Vitals:   08/08/19 1130  TempSrc:   PainSc: 8                  Dantonio Justen A.

## 2019-08-08 NOTE — Op Note (Signed)
Procedure Note  Pre-operative Diagnosis:  Thyroid neoplasm of uncertain behavior, multiple thyroid nodules  Post-operative Diagnosis:  same  Surgeon:  Armandina Gemma, MD  Assistant:  Carlena Hurl, PA-C   Procedure:  Right thyroid lobectomy and isthmusectomy  Anesthesia:  General  Estimated Blood Loss:  minimal  Drains: none         Specimen: thyroid lobe to pathology  Indications:  Patient is referred by Dr. Bud Face for surgical evaluation and management of newly diagnosed thyroid neoplasm of uncertain behavior. Patient had been undergoing a CT scan of the chest for evaluation of pulmonary emboli. Patient has a history of DVT and pulmonary embolism and is on long-term anticoagulation. Incidental finding was made of a right sided thyroid nodule. Patient subsequently underwent thyroid ultrasound on April 18, 2019. We reviewed this report today in detail and I provided the patient with a copy. This shows a dominant nodule in the right thyroid lobe measuring 2.4 cm in greatest dimension. It was felt to be moderately suspicious. An additional 1.4 cm nodule was sponge performed in the right thyroid lobe. Left thyroid lobe had no significant or worrisome findings. Patient underwent fine-needle aspiration biopsy on May 04, 2019. This showed cytologic atypia, Bethesda category III. The specimen was subsequently sent for molecular genetic testing, AFIRMA. The result was suspicious, rendering a 50% risk of malignancy. Patient is referred today to discuss surgical resection for definitive diagnosis and management.   Procedure Details: Procedure was done in OR #4 at the [facility]. The patient was brought to the operating room and placed in a supine position on the operating room table. Following administration of general anesthesia, the patient was positioned and then prepped and draped in the usual aseptic fashion. After ascertaining that an adequate level of anesthesia had been  achieved, a small Kocher incision was made with #15 blade. Dissection was carried through subcutaneous tissues and platysma. Hemostasis was achieved with the electrocautery. Skin flaps were elevated cephalad and caudad from the thyroid notch to the sternal notch. A self-retaining retractor was placed for exposure. Strap muscles were incised in the midline and dissection was begun on the right side. Strap muscles were reflected laterally. The right thyroid lobe was mildly enlarged with a dominant nodule in the inferior pole and a smaller nodule in the mid pole. The lobe was gently mobilized with blunt dissection. Superior pole vessels were dissected out and divided individually between small and medium ligaclips with the harmonic scalpel. The thyroid lobe was rolled anteriorly. Branches of the inferior thyroid artery were divided between small ligaclips with the harmonic scalpel. Inferior venous tributaries were divided between ligaclips. Both the superior and inferior parathyroid glands were identified and preserved on their vascular pedicles. The recurrent laryngeal nerve was identified and preserved along its course. The ligament of Gwenlyn Found was released with the electrocautery and the gland was mobilized onto the anterior trachea. Isthmus was mobilized across the midline. There was a small pyramidal lobe present which was resected with the isthmus. The thyroid parenchyma was transected at the junction of the isthmus and contralateral thyroid lobe with the harmonic scalpel. The thyroid lobe and isthmus were submitted to pathology for review.  The neck was irrigated with warm saline. Fibrillar was placed throughout the operative field. Strap muscles were approximated in the midline with interrupted 3-0 Vicryl sutures. Platysma was closed with interrupted 3-0 Vicryl sutures. Skin was closed with a running 4-0 Monocryl subcuticular suture.  Wound was washed and dried and Dermabond was  applied. The patient was awakened  from anesthesia and brought to the recovery room. The patient tolerated the procedure well.   Armandina Gemma, MD [facility] Office: [phone]

## 2019-08-08 NOTE — Interval H&P Note (Signed)
History and Physical Interval Note:  08/08/2019 8:52 AM  Tracey Perez  has presented today for surgery, with the diagnosis of THYROID NEOPLASM OF UNCERTAIN BEHAVIOR, MULTIPLE THRYOID NODULES.  The various methods of treatment have been discussed with the patient and family. After consideration of risks, benefits and other options for treatment, the patient has consented to    Procedure(s): RIGHT THYROID LOBECTOMY (Right) as a surgical intervention.    The patient's history has been reviewed, patient examined, no change in status, stable for surgery.  I have reviewed the patient's chart and labs.  Questions were answered to the patient's satisfaction.    Armandina Gemma, MD [facility] Office: [place]

## 2019-08-08 NOTE — Transfer of Care (Signed)
Immediate Anesthesia Transfer of Care Note  Patient: Tracey Perez  Procedure(s) Performed: Procedure(s): RIGHT THYROID LOBECTOMY (Right)  Patient Location: PACU  Anesthesia Type:General  Level of Consciousness:  sedated, patient cooperative and responds to stimulation  Airway & Oxygen Therapy:Patient Spontanous Breathing and Patient connected to face mask oxgen  Post-op Assessment:  Report given to PACU RN and Post -op Vital signs reviewed and stable  Post vital signs:  Reviewed and stable  Last Vitals:  Vitals:   08/08/19 0733  BP: (!) 151/104  Pulse: (!) 105  Resp: 18  Temp: 36.8 C  SpO2: [id number]    Complications: No apparent anesthesia complications

## 2019-08-09 ENCOUNTER — Encounter: Payer: Self-pay | Admitting: *Deleted

## 2019-08-09 ENCOUNTER — Other Ambulatory Visit: Payer: Self-pay

## 2019-08-09 LAB — SURGICAL PATHOLOGY

## 2019-08-09 NOTE — Discharge Summary (Signed)
Physician Discharge Summary [facility]  Patient ID: Tracey Perez MRN: [id number] DOB/AGE: Mar 01, 1966 54 y.o.  Admit date: 08/08/2019 Discharge date: 08/09/2019  Admission Diagnoses:  Thyroid neoplasm of uncertain behavior  Discharge Diagnoses:  Principal Problem:   Neoplasm of uncertain behavior of thyroid gland Active Problems:   Multiple thyroid nodules   Discharged Condition: good  Hospital Course: Patient was admitted for observation following thyroid surgery.  Post op course was uncomplicated.  Pain was well controlled.  Tolerated diet.  Patient was prepared for discharge home on POD#1.  Consults: None  Treatments: surgery: right thyroid lobectomy and isthmusectomy  Discharge Exam: Blood pressure (!) 160/85, pulse 85, temperature 98 F (36.7 C), temperature source Oral, resp. rate 18, height '5\' 4"'$  (1.626 m), weight 78.2 kg, SpO2 97 %. HEENT - clear Neck - wound dry and intact; Dermabond in place; mild hoarseness Chest - clear bilaterally Cor - RRR   Disposition: Home  Discharge Instructions    Diet - low sodium heart healthy   Complete by: As directed    Discharge instructions   Complete by: As directed    [facility]  THYROID & PARATHYROID SURGERY:  POST-OP INSTRUCTIONS  Always review your discharge instruction sheet from the facility where your surgery was performed.  A prescription for pain medication may be given to you upon discharge.  Take your pain medication as prescribed.  If narcotic pain medicine is not needed, then you may take acetaminophen (Tylenol) or ibuprofen (Advil) as needed.  Take your usually prescribed medications unless otherwise directed.  If you need a refill on your pain medication, please contact our office during regular business hours.  Prescriptions cannot be processed by our office after 5 pm or on weekends.  Start with a light diet upon arrival home, such as soup and crackers or  toast.  Be sure to drink plenty of fluids daily.  Resume your normal diet the day after surgery.  Most patients will experience some swelling and bruising on the chest and neck area.  Ice packs will help.  Swelling and bruising can take several days to resolve.   It is common to experience some constipation after surgery.  Increasing fluid intake and taking a stool softener (Colace) will usually help or prevent this problem.  A mild laxative (Milk of Magnesia or Miralax) should be taken according to package directions if there has been no bowel movement after 48 hours.  You have steri-strips and a gauze dressing over your incision.  You may remove the gauze bandage on the second day after surgery, and you may shower at that time.  Leave your steri-strips (small skin tapes) in place directly over the incision.  These strips should remain on the skin for 5-7 days and then be removed.  You may get them wet in the shower and pat them dry.  You may resume regular (light) daily activities beginning the next day (such as daily self-care, walking, climbing stairs) gradually increasing activities as tolerated.  You may have sexual intercourse when it is comfortable.  Refrain from any heavy lifting or straining until approved by your doctor.  You may drive when you no longer are taking prescription pain medication, you can comfortably wear a seatbelt, and you can safely maneuver your car and apply brakes.  You should see your doctor in the office for a follow-up appointment approximately three weeks after your surgery.  Make sure that you call for this appointment  within a day or two after you arrive home to insure a convenient appointment time.  WHEN TO CALL YOUR DOCTOR: -- Fever greater than 101.5 -- Inability to urinate -- Nausea and/or vomiting - persistent -- Extreme swelling or bruising -- Continued bleeding from incision -- Increased pain, redness, or drainage from the incision -- Difficulty  swallowing or breathing -- Muscle cramping or spasms -- Numbness or tingling in hands or around lips  The clinic staff is available to answer your questions during regular business hours.  Please don't hesitate to call and ask to speak to one of the nurses if you have concerns.  Armandina Gemma, MD [facility] Office: [phone]   Increase activity slowly   Complete by: As directed    No dressing needed   Complete by: As directed      Allergies as of 08/09/2019      Reactions   Morphine And Related Hives   Nsaids Other (See Comments)   Affects platelet count   Sulfa Antibiotics Shortness Of Breath   Tape Other (See Comments)   bruises skin   Verapamil Swelling   Lisinopril Other (See Comments)   Burning throat, metallic taste   Other Itching   Pt states she is allergic to the plastic that IV bags are made of. Reaction causes itching relieved by Benadryl.    Acetazolamide Other (See Comments)   Acid reflux   Seroquel [quetiapine Fumarate] Other (See Comments)   Muscle cramps      Medication List    TAKE these medications   apixaban 5 MG Tabs tablet Commonly known as: ELIQUIS Take 1 tablet (5 mg total) by mouth 2 (two) times daily. one tab po twice a day afterwards   Blood Pressure Kit Kit 1 kit by Does not apply route 2 (two) times daily.   diphenhydrAMINE 25 mg capsule Commonly known as: BENADRYL Take 50 mg by mouth 4 (four) times daily as needed for allergies or sleep. Patient taking 100 mg QHS as needed.   fluticasone 50 MCG/ACT nasal spray Commonly known as: FLONASE Place 1 spray into both nostrils daily.   gabapentin 300 MG capsule Commonly known as: NEURONTIN Take 300 mg by mouth 3 (three) times daily.   hydrALAZINE 25 MG tablet Commonly known as: APRESOLINE Take 1 tablet (25 mg total) by mouth daily. What changed: when to take this   Melatonin 10 MG Tabs Take 10 mg by mouth at bedtime.   mirtazapine 30 MG tablet Commonly known as:  REMERON Take 15 mg by mouth at bedtime.   oxycodone 30 MG immediate release tablet Commonly known as: ROXICODONE Take 30 mg by mouth 5 (five) times daily.   tiZANidine 4 MG tablet Commonly known as: ZANAFLEX Take 12-16 mg by mouth at bedtime.   venlafaxine XR 150 MG 24 hr capsule Commonly known as: EFFEXOR-XR Take 300 mg by mouth daily.   Vitamin D 125 MCG (5000 UT) Caps Take 10,000 Units by mouth at bedtime.   zolpidem 10 MG tablet Commonly known as: AMBIEN Take 10 mg by mouth at bedtime.      Follow-up Information    Armandina Gemma, MD. Schedule an appointment as soon as possible for a visit in 2 week(s).   Specialty: General Surgery Contact information: [address] [place] [address] [phone]           Earnstine Regal, MD, [facility] Office: [phone]   Signed: Armandina Gemma 08/09/2019,  8:38 AM

## 2019-08-09 NOTE — Progress Notes (Signed)
Instructions were reviewed with patient. All questions were answered. Patient was transported to main entrance by wheelchair. ° °

## 2019-09-07 ENCOUNTER — Other Ambulatory Visit: Payer: Self-pay | Admitting: Gerontology

## 2019-09-07 ENCOUNTER — Encounter: Payer: Self-pay | Admitting: "Endocrinology

## 2019-09-07 DIAGNOSIS — E041 Nontoxic single thyroid nodule: Secondary | ICD-10-CM

## 2019-09-13 ENCOUNTER — Other Ambulatory Visit: Payer: Self-pay | Admitting: "Endocrinology

## 2019-09-13 ENCOUNTER — Telehealth: Payer: Self-pay | Admitting: "Endocrinology

## 2019-09-13 DIAGNOSIS — E049 Nontoxic goiter, unspecified: Secondary | ICD-10-CM

## 2019-09-13 NOTE — Telephone Encounter (Signed)
Patient is scheduled for this Friday 5/28. Do you need her to do anymore labs before then?

## 2019-09-13 NOTE — Telephone Encounter (Signed)
Yes, TSH and Free T4. I will order.

## 2019-09-14 ENCOUNTER — Other Ambulatory Visit: Payer: Self-pay

## 2019-09-14 DIAGNOSIS — E041 Nontoxic single thyroid nodule: Secondary | ICD-10-CM

## 2019-09-14 DIAGNOSIS — D509 Iron deficiency anemia, unspecified: Secondary | ICD-10-CM

## 2019-09-14 DIAGNOSIS — E785 Hyperlipidemia, unspecified: Secondary | ICD-10-CM

## 2019-09-14 NOTE — Progress Notes (Signed)
frrferr

## 2019-09-15 LAB — CBC WITH DIFFERENTIAL/PLATELET
Basophils Absolute: 0 10*3/uL (ref 0.0–0.2)
Basos: 1 %
EOS (ABSOLUTE): 0.3 10*3/uL (ref 0.0–0.4)
Eos: 5 %
Hematocrit: 39.5 % (ref 34.0–46.6)
Hemoglobin: 13.2 g/dL (ref 11.1–15.9)
Immature Grans (Abs): 0 10*3/uL (ref 0.0–0.1)
Immature Granulocytes: 0 %
Lymphocytes Absolute: 1.4 10*3/uL (ref 0.7–3.1)
Lymphs: 27 %
MCH: 27.8 pg (ref 26.6–33.0)
MCHC: 33.4 g/dL (ref 31.5–35.7)
MCV: 83 fL (ref 79–97)
Monocytes Absolute: 0.3 10*3/uL (ref 0.1–0.9)
Monocytes: 6 %
Neutrophils Absolute: 3.2 10*3/uL (ref 1.4–7.0)
Neutrophils: 61 %
Platelets: 297 10*3/uL (ref 150–450)
RBC: 4.74 x10E6/uL (ref 3.77–5.28)
RDW: 14.5 % (ref 11.7–15.4)
WBC: 5.3 10*3/uL (ref 3.4–10.8)

## 2019-09-15 LAB — LIPID PANEL
Chol/HDL Ratio: 4.4 ratio (ref 0.0–4.4)
Cholesterol, Total: 200 mg/dL — ABNORMAL HIGH (ref 100–199)
HDL: 45 mg/dL (ref >39)
LDL Chol Calc (NIH): 100 mg/dL — ABNORMAL HIGH (ref 0–99)
Triglycerides: 325 mg/dL — ABNORMAL HIGH (ref 0–149)
VLDL Cholesterol Cal: 55 mg/dL — ABNORMAL HIGH (ref 5–40)

## 2019-09-15 LAB — FERRITIN: Ferritin: 117 ng/mL (ref 15–150)

## 2019-09-15 LAB — THYROID PANEL WITH TSH
Free Thyroxine Index: 1 — ABNORMAL LOW (ref 1.2–4.9)
T3 Uptake Ratio: 21 % — ABNORMAL LOW (ref 24–39)
T4, Total: 4.7 ug/dL (ref 4.5–12.0)
TSH: 7.24 u[IU]/mL — ABNORMAL HIGH (ref 0.450–4.500)

## 2019-09-16 ENCOUNTER — Ambulatory Visit (INDEPENDENT_AMBULATORY_CARE_PROVIDER_SITE_OTHER): Payer: Self-pay | Admitting: "Endocrinology

## 2019-09-16 ENCOUNTER — Other Ambulatory Visit: Payer: Self-pay

## 2019-09-16 ENCOUNTER — Encounter: Payer: Self-pay | Admitting: "Endocrinology

## 2019-09-16 VITALS — BP 139/87 | HR 90 | Ht 63.0 in | Wt 181.8 lb

## 2019-09-16 DIAGNOSIS — E890 Postprocedural hypothyroidism: Secondary | ICD-10-CM | POA: Insufficient documentation

## 2019-09-16 MED ORDER — LEVOTHYROXINE SODIUM 25 MCG PO TABS: 25.0000 ug | ORAL_TABLET | Freq: Every day | ORAL | 2 refills | Status: DC

## 2019-09-16 NOTE — Progress Notes (Signed)
09/16/2019, 1:24 PM      Endocrinology follow-up note   Subjective:    Patient ID: Tracey Perez, female    DOB: Aug 05, 1965, PCP Langston Reusing, NP   Past Medical History:  Diagnosis Date  . Anemia    Ferritin anemia   . Anxiety   . Complication of anesthesia   . Depression   . DVT (deep vein thrombosis) in pregnancy   . Headache    chronic migraines   . Hypertension   . PONV (postoperative nausea and vomiting)   . Pulmonary embolism ([place])    hx of last one 10/20 hospitalized for one day at [facility] per patient followed by Dr Janese Banks   . Sleep apnea    does not uses cpap    Past Surgical History:  Procedure Laterality Date  . ABDOMINAL HYSTERECTOMY    . CESAREAN SECTION    . laparoscopic surgery     . THYROID LOBECTOMY Right 08/08/2019   Procedure: RIGHT THYROID LOBECTOMY;  Surgeon: Armandina Gemma, MD;  Location: [facility];  Service: General;  Laterality: Right;  . TONSILLECTOMY     Social History   Socioeconomic History  . Marital status: Divorced    Spouse name: Not on file  . Number of children: 1  . Years of education: 23  . Highest education level: Not on file  Occupational History  . Not on file  Tobacco Use  . Smoking status: Former Smoker    Packs/day: 1.00    Years: 25.00    Pack years: 25.00    Types: Cigarettes    Quit date: 12/17/2011    Years since quitting: 7.7  . Smokeless tobacco: Never Used  Substance and Sexual Activity  . Alcohol use: Never  . Drug use: Never  . Sexual activity: Not on file  Other Topics Concern  . Not on file  Social History Narrative  . Not on file   Social Determinants of Health   Financial Resource Strain: Low Risk   . Difficulty of Paying Living Expenses: Not very hard  Food Insecurity: No Food Insecurity  . Worried About Charity fundraiser in the Last Year: Never true  . Ran Out of Food in the Last Year: Never true  Transportation Needs: No Transportation  Needs  . Lack of Transportation (Medical): No  . Lack of Transportation (Non-Medical): No  Physical Activity:   . Days of Exercise per Week:   . Minutes of Exercise per Session:   Stress: No Stress Concern Present  . Feeling of Stress : Not at all  Social Connections:   . Frequency of Communication with Friends and Family:   . Frequency of Social Gatherings with Friends and Family:   . Attends Religious Services:   . Active Member of Clubs or Organizations:   . Attends Archivist Meetings:   Marland Kitchen Marital Status:    Family History  Problem Relation Age of Onset  . Non-Hodgkin's lymphoma Mother   . Hypertension Father   . Depression Father   . Alcohol abuse Father   . Migraines Father   . Multiple sclerosis Sister   . Migraines Sister   . Migraines Brother   . Thyroid  disease Brother   . Migraines Brother   . Thyroid disease Brother   . Thyroid disease Brother   . Thyroid disease Brother    Outpatient Encounter Medications as of 09/16/2019  Medication Sig  . apixaban (ELIQUIS) 5 MG TABS tablet Take 1 tablet (5 mg total) by mouth 2 (two) times daily. one tab po twice a day afterwards  . Blood Pressure Monitoring (BLOOD PRESSURE KIT) KIT 1 kit by Does not apply route 2 (two) times daily.  . Cholecalciferol (VITAMIN D) 125 MCG (5000 UT) CAPS Take 10,000 Units by mouth at bedtime.   . diphenhydrAMINE (BENADRYL) 25 mg capsule Take 50 mg by mouth 4 (four) times daily as needed for allergies or sleep. Patient taking 100 mg QHS as needed.  . fluticasone (FLONASE) 50 MCG/ACT nasal spray Place 1 spray into both nostrils daily. (Patient not taking: Reported on 06/06/2019)  . gabapentin (NEURONTIN) 300 MG capsule Take 300 mg by mouth 3 (three) times daily.  . hydrALAZINE (APRESOLINE) 25 MG tablet Take 1 tablet (25 mg total) by mouth daily. (Patient taking differently: Take 25 mg by mouth every evening. )  . levothyroxine (SYNTHROID) 25 MCG tablet Take 1 tablet (25 mcg total) by mouth  daily before breakfast.  . Melatonin 10 MG TABS Take 10 mg by mouth at bedtime.  . mirtazapine (REMERON) 30 MG tablet Take 15 mg by mouth at bedtime.   Marland Kitchen oxycodone (ROXICODONE) 30 MG immediate release tablet Take 30 mg by mouth 5 (five) times daily.  Marland Kitchen tiZANidine (ZANAFLEX) 4 MG tablet Take 12-16 mg by mouth at bedtime.   Marland Kitchen venlafaxine XR (EFFEXOR-XR) 150 MG 24 hr capsule Take 300 mg by mouth daily.  Marland Kitchen zolpidem (AMBIEN) 10 MG tablet Take 10 mg by mouth at bedtime.    No facility-administered encounter medications on file as of 09/16/2019.   ALLERGIES: Allergies  Allergen Reactions  . Morphine And Related Hives  . Nsaids Other (See Comments)    Affects platelet count  . Sulfa Antibiotics Shortness Of Breath  . Tape Other (See Comments)    bruises skin  . Verapamil Swelling  . Lisinopril Other (See Comments)    Burning throat, metallic taste  . Other Itching    Pt states she is allergic to the plastic that IV bags are made of. Reaction causes itching relieved by Benadryl.   . Acetazolamide Other (See Comments)    Acid reflux   . Seroquel [Quetiapine Fumarate] Other (See Comments)    Muscle cramps    VACCINATION STATUS: Immunization History  Administered Date(s) Administered  . Influenza,inj,Quad PF,6+ Mos 02/16/2019  . Influenza-Unspecified 01/25/2012, 01/13/2013, 01/05/2014, 01/29/2015, 02/07/2016  . Tdap 12/09/2010    HPI Tracey Perez is 54 y.o. female who is being seen in follow-up after she underwent right hemithyroidectomy for abnormal thyroid biopsies.   PCP:  Langston Reusing, NP.  Her work-up for nodular goiter with monitoring studies resulted in 50% chance of malignancy risk which led to her surgical referral.  She underwent hemithyroidectomy on August 08, 2019.  Her surgical sample was negative for malignancy.  She is not on any thyroid hormone replacement.  Her previsit labs indicate high TSH and low free T4 consistent with postsurgical hypothyroidism. -She  tolerated her surgery very well.  She has no new complaints.  She has gained approximately 10 pounds since last visit.  Patient denies any dysphagia, shortness of breath, no voice change.  She has steady weight, denies palpitations, tremors, heat/cold intolerance.  She has some family history of thyroid dysfunction, however no family history of thyroid malignancy.  She is a former smoker quit in 2013. Her recent thyroid function tests were within normal limits.  Review of Systems Limited as above.  Objective:    BP 139/87   Pulse 90   Ht '5\' 3"'$  (1.6 m)   Wt 181 lb 12.8 oz (82.5 kg)   BMI 32.20 kg/m   Wt Readings from Last 3 Encounters:  09/16/19 181 lb 12.8 oz (82.5 kg)  09/14/19 180 lb 3.2 oz (81.7 kg)  08/08/19 172 lb 6.4 oz (78.2 kg)    Physical Exam   Physical Exam- Limited  Constitutional:  Body mass index is 32.2 kg/m. , not in acute distress, normal state of mind Eyes:  EOMI, no exophthalmos Neck: Supple Thyroid: Healing surgical scar on anterior lower neck.   Respiratory: Adequate breathing efforts Musculoskeletal: no gross deformities, strength intact in all four extremities, no gross restriction of joint movements Skin:  no rashes, no hyperemia Neurological: no tremor with outstretched hands,   CMP     Component Value Date/Time   NA 141 08/08/2019 0811   K 3.8 08/08/2019 0811   CL 104 08/08/2019 0811   CO2 25 08/08/2019 0811   GLUCOSE 97 08/08/2019 0811   BUN 15 08/08/2019 0811   CREATININE 0.94 08/08/2019 0811   CALCIUM 9.9 08/08/2019 0811   GFRNONAA >60 08/08/2019 0811   GFRAA >60 08/08/2019 0811     Diabetic Labs (most recent): Lab Results  Component Value Date   HGBA1C 5.6 06/15/2019   HGBA1C 5.8 (H) 02/16/2019     Lab Results  Component Value Date   TSH 7.240 (H) 09/14/2019   TSH 1.040 02/16/2019     Assessment & Plan:   1.  Postsurgical hypothyroidism 2.  Status post right hemithyroidectomy Her surgical sample was negative for  malignancy.  Prior to her surgery, due to abnormal cytology on her thyroid biopsy, and 50% malignancy risk on Afirma she was sent for surgery.  -Her previsit thyroid function tests are consistent with postsurgical hypothyroidism.  She is approached for thyroid hormone replacement.  She says she does not want any thyroid hormone replacement.  With significant hesitancy she accepted low-dose levothyroxine.  I discussed and initiated levothyroxine 25 mcg daily before breakfast.   - We discussed about the correct intake of her thyroid hormone, on empty stomach at fasting, with water, separated by at least 30 minutes from breakfast and other medications,  and separated by more than 4 hours from calcium, iron, multivitamins, acid reflux medications (PPIs). -Patient is made aware of the fact that thyroid hormone replacement is needed for life, dose to be adjusted by periodic monitoring of thyroid function tests.  -She will have repeat thyroid function test in 9 weeks with office visit.  She had only right hemithyroidectomy, will need a follow-up thyroid ultrasound in her neck in 1 to 2 years. - she is advised to maintain close follow up with [place], Chioma E, NP for primary care needs.     - Time spent on this patient care encounter:  20 minutes of which 50% was spent in  counseling and the rest reviewing  her current and  previous labs / studies and medications  doses and developing a plan for long term care. Tracey Perez  participated in the discussions, expressed understanding, and voiced agreement with the above plans.  All questions were answered to her satisfaction. she is encouraged to contact  clinic should she have any questions or concerns prior to her return visit.   Follow up plan: Return in about 9 weeks (around 11/18/2019) for F/U with Pre-visit Labs.   Glade Lloyd, MD [facility] [address] [place], [place] [address] Phone:  [phone]  Fax: [phone]     09/16/2019, 1:24 PM  This note was partially dictated with voice recognition software. Similar sounding words can be transcribed inadequately or may not  be corrected upon review.

## 2019-09-20 ENCOUNTER — Other Ambulatory Visit: Payer: Self-pay | Admitting: Gerontology

## 2019-09-20 DIAGNOSIS — E039 Hypothyroidism, unspecified: Secondary | ICD-10-CM

## 2019-09-21 ENCOUNTER — Other Ambulatory Visit: Payer: Self-pay

## 2019-09-21 ENCOUNTER — Ambulatory Visit: Payer: Self-pay | Admitting: Gerontology

## 2019-09-21 VITALS — BP 149/94 | HR 92 | Ht 63.0 in | Wt 186.0 lb

## 2019-09-21 DIAGNOSIS — Z0000 Encounter for general adult medical examination without abnormal findings: Secondary | ICD-10-CM

## 2019-09-21 DIAGNOSIS — E039 Hypothyroidism, unspecified: Secondary | ICD-10-CM

## 2019-09-21 DIAGNOSIS — I10 Essential (primary) hypertension: Secondary | ICD-10-CM

## 2019-09-21 NOTE — Progress Notes (Signed)
Established Patient Office Visit  Subjective:  Patient ID: Tracey Perez, female    DOB: [birth year]  Age: 54 y.o. MRN: [id number]  CC:  Chief Complaint  Patient presents with  . Hypertension  . Hypothyroidism    HPI Tracey Perez presents for hypertension and hypothyroidism.She had right thyroid lobectomy and isthmusectomy done on 08/08/2019 by Dr Harlow Asa T. Her TSH done on 09/14/2019 was 7.240, She was seen by Endocrinologist Dr Jacqualyn Posey on 09/16/2019 and was started on 25 mcg of Levothyroxine. She states that she will pick up Levothyroxine today from the Pharmacy. She denies heat/cold intolerance, weight gain, and constipation. She states that she's compliant with her medications and continues to make life style modifications. She states that she has not done Mammogram and Colonoscopy. She continues on Eliquis 5 mg bid, she reports experiencing mild bruises, but denies hematuria and hematochezia. Overall, she states that she's doing well and offers no further complaint.  Past Medical History:  Diagnosis Date  . Anemia    Ferritin anemia   . Anxiety   . Complication of anesthesia   . Depression   . DVT (deep vein thrombosis) in pregnancy   . Headache    chronic migraines   . Hypertension   . PONV (postoperative nausea and vomiting)   . Pulmonary embolism ([place])    hx of last one 10/20 hospitalized for one day at [facility] per patient followed by Dr Janese Banks   . Sleep apnea    does not uses cpap     Past Surgical History:  Procedure Laterality Date  . ABDOMINAL HYSTERECTOMY    . CESAREAN SECTION    . laparoscopic surgery     . THYROID LOBECTOMY Right 08/08/2019   Procedure: RIGHT THYROID LOBECTOMY;  Surgeon: Armandina Gemma, MD;  Location: [facility];  Service: General;  Laterality: Right;  . TONSILLECTOMY      Family History  Problem Relation Age of Onset  . Non-Hodgkin's lymphoma Mother   . Hypertension Father   . Depression Father   . Alcohol abuse Father   . Migraines  Father   . Multiple sclerosis Sister   . Migraines Sister   . Migraines Brother   . Thyroid disease Brother   . Migraines Brother   . Thyroid disease Brother   . Thyroid disease Brother   . Thyroid disease Brother     Social History   Socioeconomic History  . Marital status: Divorced    Spouse name: Not on file  . Number of children: 1  . Years of education: 58  . Highest education level: Not on file  Occupational History  . Not on file  Tobacco Use  . Smoking status: Former Smoker    Packs/day: 1.00    Years: 25.00    Pack years: 25.00    Types: Cigarettes    Quit date: 12/17/2011    Years since quitting: 7.7  . Smokeless tobacco: Never Used  Substance and Sexual Activity  . Alcohol use: Never  . Drug use: Never  . Sexual activity: Not on file  Other Topics Concern  . Not on file  Social History Narrative  . Not on file   Social Determinants of Health   Financial Resource Strain: Low Risk   . Difficulty of Paying Living Expenses: Not very hard  Food Insecurity: No Food Insecurity  . Worried About Charity fundraiser in the Last Year: Never true  . Ran Out of Food in the Last Year:  Never true  Transportation Needs: No Transportation Needs  . Lack of Transportation (Medical): No  . Lack of Transportation (Non-Medical): No  Physical Activity:   . Days of Exercise per Week:   . Minutes of Exercise per Session:   Stress: No Stress Concern Present  . Feeling of Stress : Not at all  Social Connections:   . Frequency of Communication with Friends and Family:   . Frequency of Social Gatherings with Friends and Family:   . Attends Religious Services:   . Active Member of Clubs or Organizations:   . Attends Archivist Meetings:   Marland Kitchen Marital Status:   Intimate Partner Violence: Not At Risk  . Fear of Current or Ex-Partner: No  . Emotionally Abused: No  . Physically Abused: No  . Sexually Abused: No    Outpatient Medications Prior to Visit  Medication  Sig Dispense Refill  . apixaban (ELIQUIS) 5 MG TABS tablet Take 1 tablet (5 mg total) by mouth 2 (two) times daily. one tab po twice a day afterwards 60 tablet 2  . Cholecalciferol (VITAMIN D) 125 MCG (5000 UT) CAPS Take 10,000 Units by mouth at bedtime.     . diphenhydrAMINE (BENADRYL) 25 mg capsule Take 50 mg by mouth 4 (four) times daily as needed for allergies or sleep. Patient taking 100 mg QHS as needed.    . gabapentin (NEURONTIN) 300 MG capsule Take 300 mg by mouth 3 (three) times daily.    . hydrALAZINE (APRESOLINE) 25 MG tablet Take 1 tablet (25 mg total) by mouth daily. (Patient taking differently: Take 25 mg by mouth every evening. ) 90 tablet 1  . levothyroxine (SYNTHROID) 25 MCG tablet Take 1 tablet (25 mcg total) by mouth daily before breakfast. 30 tablet 2  . Melatonin 10 MG TABS Take 10 mg by mouth at bedtime.    . mirtazapine (REMERON) 30 MG tablet Take 15 mg by mouth at bedtime.     Marland Kitchen oxycodone (ROXICODONE) 30 MG immediate release tablet Take 30 mg by mouth 5 (five) times daily.    Marland Kitchen tiZANidine (ZANAFLEX) 4 MG tablet Take 12-16 mg by mouth at bedtime.     Marland Kitchen venlafaxine XR (EFFEXOR-XR) 150 MG 24 hr capsule Take 300 mg by mouth daily.    Marland Kitchen zolpidem (AMBIEN) 10 MG tablet Take 10 mg by mouth at bedtime.     . Blood Pressure Monitoring (BLOOD PRESSURE KIT) KIT 1 kit by Does not apply route 2 (two) times daily. 1 kit 0  . fluticasone (FLONASE) 50 MCG/ACT nasal spray Place 1 spray into both nostrils daily. (Patient not taking: Reported on 06/06/2019) 16 g 2   No facility-administered medications prior to visit.    Allergies  Allergen Reactions  . Morphine And Related Hives  . Nsaids Other (See Comments)    Affects platelet count  . Sulfa Antibiotics Shortness Of Breath  . Tape Other (See Comments)    bruises skin  . Verapamil Swelling  . Lisinopril Other (See Comments)    Burning throat, metallic taste  . Other Itching    Pt states she is allergic to the plastic that IV bags  are made of. Reaction causes itching relieved by Benadryl.   . Acetazolamide Other (See Comments)    Acid reflux   . Seroquel [Quetiapine Fumarate] Other (See Comments)    Muscle cramps    ROS Review of Systems  Constitutional: Negative.   Respiratory: Negative.   Cardiovascular: Negative.   Gastrointestinal: Negative.  Endocrine: Negative.   Genitourinary: Negative.   Neurological: Negative.   Hematological: Bruises/bleeds easily.  Psychiatric/Behavioral: Negative.       Objective:    Physical Exam  Constitutional: She is oriented to person, place, and time. She appears well-developed.  HENT:  Head: Normocephalic and atraumatic.  Cardiovascular: Normal rate and regular rhythm.  Pulmonary/Chest: Effort normal and breath sounds normal.  Musculoskeletal:     Cervical back: Normal range of motion.  Neurological: She is alert and oriented to person, place, and time.  Psychiatric: She has a normal mood and affect. Her behavior is normal. Judgment and thought content normal.    BP (!) 149/94 (BP Location: Right Arm, Patient Position: Sitting)   Pulse 92   Ht _0  (1.6 m)   Wt 186 lb (84.4 kg)   SpO2 96%   BMI 32.95 kg/m  Wt Readings from Last 3 Encounters:  09/21/19 186 lb (84.4 kg)  09/16/19 181 lb 12.8 oz (82.5 kg)  09/14/19 180 lb 3.2 oz (81.7 kg)   She was encouraged to continue on her weight loss regimen.  Health Maintenance Due  Topic Date Due  . COVID-19 Vaccine (1) Never done  . PAP SMEAR-Modifier  Never done  . MAMMOGRAM  Never done  . COLONOSCOPY  Never done    There are no preventive care reminders to display for this patient.  Lab Results  Component Value Date   TSH 7.240 (H) 09/14/2019   Lab Results  Component Value Date   WBC 5.3 09/14/2019   HGB 13.2 09/14/2019   HCT 39.5 09/14/2019   MCV 83 09/14/2019   PLT 297 09/14/2019   Lab Results  Component Value Date   NA 141 08/08/2019   K 3.8 08/08/2019   CO2 25 08/08/2019   GLUCOSE 97  08/08/2019   BUN 15 08/08/2019   CREATININE 0.94 08/08/2019   CALCIUM 9.9 08/08/2019   ANIONGAP 12 08/08/2019   Lab Results  Component Value Date   CHOL 200 (H) 09/14/2019   Lab Results  Component Value Date   HDL 45 09/14/2019   Lab Results  Component Value Date   LDLCALC 100 (H) 09/14/2019   Lab Results  Component Value Date   TRIG 325 (H) 09/14/2019   Lab Results  Component Value Date   CHOLHDL 4.4 09/14/2019   Lab Results  Component Value Date   HGBA1C 5.6 06/15/2019      Assessment & Plan:    1. Essential hypertension - Her blood pressure is not under control, her goal blood pressure is less than 140/90. She will continue on current treatment regimen, advised to continue on DASH diet and exercise as tolerated.  2. Hypothyroidism, unspecified type - She will continue on 25 mcg of Levothyroxine, will recheck TSH in 9 weeks. - She states that she prefers to follow up with [facility]. Ambulatory referral to Endocrinology  3. Health care maintenance  - Ambulatory referral to Gastroenterology for Colonoscopy screening. - Ambulatory referral to Hematology / Oncology for Mammogram screening.    Follow-up: Return in about 3 months (around 12/22/2019).    Solomiya Pascale Jerold Coombe, NP

## 2019-09-21 NOTE — Patient Instructions (Signed)
DASH Eating Plan DASH stands for "Dietary Approaches to Stop Hypertension." The DASH eating plan is a healthy eating plan that has been shown to reduce high blood pressure (hypertension). It may also reduce your risk for type 2 diabetes, heart disease, and stroke. The DASH eating plan may also help with weight loss. What are tips for following this plan?  General guidelines  Avoid eating more than 2,300 mg (milligrams) of salt (sodium) a day. If you have hypertension, you may need to reduce your sodium intake to 1,500 mg a day.  Limit alcohol intake to no more than 1 drink a day for nonpregnant women and 2 drinks a day for men. One drink equals 12 oz of beer, 5 oz of wine, or 1 oz of hard liquor.  Work with your health care provider to maintain a healthy body weight or to lose weight. Ask what an ideal weight is for you.  Get at least 30 minutes of exercise that causes your heart to beat faster (aerobic exercise) most days of the week. Activities may include walking, swimming, or biking.  Work with your health care provider or diet and nutrition specialist (dietitian) to adjust your eating plan to your individual calorie needs. Reading food labels   Check food labels for the amount of sodium per serving. Choose foods with less than 5 percent of the Daily Value of sodium. Generally, foods with less than 300 mg of sodium per serving fit into this eating plan.  To find whole grains, look for the word "whole" as the first word in the ingredient list. Shopping  Buy products labeled as "low-sodium" or "no salt added."  Buy fresh foods. Avoid canned foods and premade or frozen meals. Cooking  Avoid adding salt when cooking. Use salt-free seasonings or herbs instead of table salt or sea salt. Check with your health care provider or pharmacist before using salt substitutes.  Do not fry foods. Cook foods using healthy methods such as baking, boiling, grilling, and broiling instead.  Cook with  heart-healthy oils, such as olive, canola, soybean, or sunflower oil. Meal planning  Eat a balanced diet that includes: ? 5 or more servings of fruits and vegetables each day. At each meal, try to fill half of your plate with fruits and vegetables. ? Up to 6-8 servings of whole grains each day. ? Less than 6 oz of lean meat, poultry, or fish each day. A 3-oz serving of meat is about the same size as a deck of cards. One egg equals 1 oz. ? 2 servings of low-fat dairy each day. ? A serving of nuts, seeds, or beans 5 times each week. ? Heart-healthy fats. Healthy fats called Omega-3 fatty acids are found in foods such as flaxseeds and coldwater fish, like sardines, salmon, and mackerel.  Limit how much you eat of the following: ? Canned or prepackaged foods. ? Food that is high in trans fat, such as fried foods. ? Food that is high in saturated fat, such as fatty meat. ? Sweets, desserts, sugary drinks, and other foods with added sugar. ? Full-fat dairy products.  Do not salt foods before eating.  Try to eat at least 2 vegetarian meals each week.  Eat more home-cooked food and less restaurant, buffet, and fast food.  When eating at a restaurant, ask that your food be prepared with less salt or no salt, if possible. What foods are recommended? The items listed may not be a complete list. Talk with your dietitian about   what dietary choices are best for you. Grains Whole-grain or whole-wheat bread. Whole-grain or whole-wheat pasta. Brown rice. Oatmeal. Quinoa. Bulgur. Whole-grain and low-sodium cereals. Pita bread. Low-fat, low-sodium crackers. Whole-wheat flour tortillas. Vegetables Fresh or frozen vegetables (raw, steamed, roasted, or grilled). Low-sodium or reduced-sodium tomato and vegetable juice. Low-sodium or reduced-sodium tomato sauce and tomato paste. Low-sodium or reduced-sodium canned vegetables. Fruits All fresh, dried, or frozen fruit. Canned fruit in natural juice (without  added sugar). Meat and other protein foods Skinless chicken or turkey. Ground chicken or turkey. Pork with fat trimmed off. Fish and seafood. Egg whites. Dried beans, peas, or lentils. Unsalted nuts, nut butters, and seeds. Unsalted canned beans. Lean cuts of beef with fat trimmed off. Low-sodium, lean deli meat. Dairy Low-fat (1%) or fat-free (skim) milk. Fat-free, low-fat, or reduced-fat cheeses. Nonfat, low-sodium ricotta or cottage cheese. Low-fat or nonfat yogurt. Low-fat, low-sodium cheese. Fats and oils Soft margarine without trans fats. Vegetable oil. Low-fat, reduced-fat, or light mayonnaise and salad dressings (reduced-sodium). Canola, safflower, olive, soybean, and sunflower oils. Avocado. Seasoning and other foods Herbs. Spices. Seasoning mixes without salt. Unsalted popcorn and pretzels. Fat-free sweets. What foods are not recommended? The items listed may not be a complete list. Talk with your dietitian about what dietary choices are best for you. Grains Baked goods made with fat, such as croissants, muffins, or some breads. Dry pasta or rice meal packs. Vegetables Creamed or fried vegetables. Vegetables in a cheese sauce. Regular canned vegetables (not low-sodium or reduced-sodium). Regular canned tomato sauce and paste (not low-sodium or reduced-sodium). Regular tomato and vegetable juice (not low-sodium or reduced-sodium). Pickles. Olives. Fruits Canned fruit in a light or heavy syrup. Fried fruit. Fruit in cream or butter sauce. Meat and other protein foods Fatty cuts of meat. Ribs. Fried meat. Bacon. Sausage. Bologna and other processed lunch meats. Salami. Fatback. Hotdogs. Bratwurst. Salted nuts and seeds. Canned beans with added salt. Canned or smoked fish. Whole eggs or egg yolks. Chicken or turkey with skin. Dairy Whole or 2% milk, cream, and half-and-half. Whole or full-fat cream cheese. Whole-fat or sweetened yogurt. Full-fat cheese. Nondairy creamers. Whipped toppings.  Processed cheese and cheese spreads. Fats and oils Butter. Stick margarine. Lard. Shortening. Ghee. Bacon fat. Tropical oils, such as coconut, palm kernel, or palm oil. Seasoning and other foods Salted popcorn and pretzels. Onion salt, garlic salt, seasoned salt, table salt, and sea salt. Worcestershire sauce. Tartar sauce. Barbecue sauce. Teriyaki sauce. Soy sauce, including reduced-sodium. Steak sauce. Canned and packaged gravies. Fish sauce. Oyster sauce. Cocktail sauce. Horseradish that you find on the shelf. Ketchup. Mustard. Meat flavorings and tenderizers. Bouillon cubes. Hot sauce and Tabasco sauce. Premade or packaged marinades. Premade or packaged taco seasonings. Relishes. Regular salad dressings. Where to find more information:  National Heart, Lung, and Blood Institute: www.nhlbi.nih.gov  American Heart Association: www.heart.org Summary  The DASH eating plan is a healthy eating plan that has been shown to reduce high blood pressure (hypertension). It may also reduce your risk for type 2 diabetes, heart disease, and stroke.  With the DASH eating plan, you should limit salt (sodium) intake to 2,300 mg a day. If you have hypertension, you may need to reduce your sodium intake to 1,500 mg a day.  When on the DASH eating plan, aim to eat more fresh fruits and vegetables, whole grains, lean proteins, low-fat dairy, and heart-healthy fats.  Work with your health care provider or diet and nutrition specialist (dietitian) to adjust your eating plan to your   individual calorie needs. This information is not intended to replace advice given to you by your health care provider. Make sure you discuss any questions you have with your health care provider. Document Revised: 03/20/2017 Document Reviewed: 03/31/2016 Elsevier Patient Education  2020 [employer].  

## 2019-11-18 ENCOUNTER — Ambulatory Visit: Payer: Self-pay | Admitting: "Endocrinology

## 2019-12-13 ENCOUNTER — Other Ambulatory Visit: Payer: Self-pay

## 2019-12-13 ENCOUNTER — Telehealth: Payer: Self-pay | Admitting: "Endocrinology

## 2019-12-13 DIAGNOSIS — E890 Postprocedural hypothyroidism: Secondary | ICD-10-CM

## 2019-12-13 MED ORDER — LEVOTHYROXINE SODIUM 25 MCG PO TABS: 25.0000 ug | ORAL_TABLET | Freq: Every day | ORAL | 0 refills | Status: DC

## 2019-12-13 NOTE — Telephone Encounter (Signed)
Rx refill sent, lab orders sent to [facility].

## 2019-12-13 NOTE — Telephone Encounter (Signed)
Pt said that Medication Mgt requested a refill on levothyroxine (SYNTHROID) 25 MCG tablet. Can you send that in? Also, can you change her order to [facility]

## 2019-12-22 ENCOUNTER — Ambulatory Visit: Payer: Self-pay | Admitting: Gerontology

## 2019-12-22 ENCOUNTER — Other Ambulatory Visit: Payer: Self-pay

## 2019-12-22 ENCOUNTER — Encounter: Payer: Self-pay | Admitting: Gerontology

## 2019-12-22 VITALS — BP 148/90 | HR 99 | Wt 175.0 lb

## 2019-12-22 DIAGNOSIS — R233 Spontaneous ecchymoses: Secondary | ICD-10-CM

## 2019-12-22 DIAGNOSIS — Z0000 Encounter for general adult medical examination without abnormal findings: Secondary | ICD-10-CM

## 2019-12-22 DIAGNOSIS — I10 Essential (primary) hypertension: Secondary | ICD-10-CM

## 2019-12-22 DIAGNOSIS — R059 Cough, unspecified: Secondary | ICD-10-CM | POA: Insufficient documentation

## 2019-12-22 MED ORDER — BENZONATATE 100 MG PO CAPS: 100.0000 mg | ORAL_CAPSULE | Freq: Three times a day (TID) | ORAL | 0 refills | Status: DC | PRN

## 2019-12-22 NOTE — Patient Instructions (Signed)
DASH Eating Plan DASH stands for "Dietary Approaches to Stop Hypertension." The DASH eating plan is a healthy eating plan that has been shown to reduce high blood pressure (hypertension). It may also reduce your risk for type 2 diabetes, heart disease, and stroke. The DASH eating plan may also help with weight loss. What are tips for following this plan?  General guidelines  Avoid eating more than 2,300 mg (milligrams) of salt (sodium) a day. If you have hypertension, you may need to reduce your sodium intake to 1,500 mg a day.  Limit alcohol intake to no more than 1 drink a day for nonpregnant women and 2 drinks a day for men. One drink equals 12 oz of beer, 5 oz of wine, or 1 oz of hard liquor.  Work with your health care provider to maintain a healthy body weight or to lose weight. Ask what an ideal weight is for you.  Get at least 30 minutes of exercise that causes your heart to beat faster (aerobic exercise) most days of the week. Activities may include walking, swimming, or biking.  Work with your health care provider or diet and nutrition specialist (dietitian) to adjust your eating plan to your individual calorie needs. Reading food labels   Check food labels for the amount of sodium per serving. Choose foods with less than 5 percent of the Daily Value of sodium. Generally, foods with less than 300 mg of sodium per serving fit into this eating plan.  To find whole grains, look for the word "whole" as the first word in the ingredient list. Shopping  Buy products labeled as "low-sodium" or "no salt added."  Buy fresh foods. Avoid canned foods and premade or frozen meals. Cooking  Avoid adding salt when cooking. Use salt-free seasonings or herbs instead of table salt or sea salt. Check with your health care provider or pharmacist before using salt substitutes.  Do not fry foods. Cook foods using healthy methods such as baking, boiling, grilling, and broiling instead.  Cook with  heart-healthy oils, such as olive, canola, soybean, or sunflower oil. Meal planning  Eat a balanced diet that includes: ? 5 or more servings of fruits and vegetables each day. At each meal, try to fill half of your plate with fruits and vegetables. ? Up to 6-8 servings of whole grains each day. ? Less than 6 oz of lean meat, poultry, or fish each day. A 3-oz serving of meat is about the same size as a deck of cards. One egg equals 1 oz. ? 2 servings of low-fat dairy each day. ? A serving of nuts, seeds, or beans 5 times each week. ? Heart-healthy fats. Healthy fats called Omega-3 fatty acids are found in foods such as flaxseeds and coldwater fish, like sardines, salmon, and mackerel.  Limit how much you eat of the following: ? Canned or prepackaged foods. ? Food that is high in trans fat, such as fried foods. ? Food that is high in saturated fat, such as fatty meat. ? Sweets, desserts, sugary drinks, and other foods with added sugar. ? Full-fat dairy products.  Do not salt foods before eating.  Try to eat at least 2 vegetarian meals each week.  Eat more home-cooked food and less restaurant, buffet, and fast food.  When eating at a restaurant, ask that your food be prepared with less salt or no salt, if possible. What foods are recommended? The items listed may not be a complete list. Talk with your dietitian about   what dietary choices are best for you. Grains Whole-grain or whole-wheat bread. Whole-grain or whole-wheat pasta. Brown rice. Oatmeal. Quinoa. Bulgur. Whole-grain and low-sodium cereals. Pita bread. Low-fat, low-sodium crackers. Whole-wheat flour tortillas. Vegetables Fresh or frozen vegetables (raw, steamed, roasted, or grilled). Low-sodium or reduced-sodium tomato and vegetable juice. Low-sodium or reduced-sodium tomato sauce and tomato paste. Low-sodium or reduced-sodium canned vegetables. Fruits All fresh, dried, or frozen fruit. Canned fruit in natural juice (without  added sugar). Meat and other protein foods Skinless chicken or turkey. Ground chicken or turkey. Pork with fat trimmed off. Fish and seafood. Egg whites. Dried beans, peas, or lentils. Unsalted nuts, nut butters, and seeds. Unsalted canned beans. Lean cuts of beef with fat trimmed off. Low-sodium, lean deli meat. Dairy Low-fat (1%) or fat-free (skim) milk. Fat-free, low-fat, or reduced-fat cheeses. Nonfat, low-sodium ricotta or cottage cheese. Low-fat or nonfat yogurt. Low-fat, low-sodium cheese. Fats and oils Soft margarine without trans fats. Vegetable oil. Low-fat, reduced-fat, or light mayonnaise and salad dressings (reduced-sodium). Canola, safflower, olive, soybean, and sunflower oils. Avocado. Seasoning and other foods Herbs. Spices. Seasoning mixes without salt. Unsalted popcorn and pretzels. Fat-free sweets. What foods are not recommended? The items listed may not be a complete list. Talk with your dietitian about what dietary choices are best for you. Grains Baked goods made with fat, such as croissants, muffins, or some breads. Dry pasta or rice meal packs. Vegetables Creamed or fried vegetables. Vegetables in a cheese sauce. Regular canned vegetables (not low-sodium or reduced-sodium). Regular canned tomato sauce and paste (not low-sodium or reduced-sodium). Regular tomato and vegetable juice (not low-sodium or reduced-sodium). Pickles. Olives. Fruits Canned fruit in a light or heavy syrup. Fried fruit. Fruit in cream or butter sauce. Meat and other protein foods Fatty cuts of meat. Ribs. Fried meat. Bacon. Sausage. Bologna and other processed lunch meats. Salami. Fatback. Hotdogs. Bratwurst. Salted nuts and seeds. Canned beans with added salt. Canned or smoked fish. Whole eggs or egg yolks. Chicken or turkey with skin. Dairy Whole or 2% milk, cream, and half-and-half. Whole or full-fat cream cheese. Whole-fat or sweetened yogurt. Full-fat cheese. Nondairy creamers. Whipped toppings.  Processed cheese and cheese spreads. Fats and oils Butter. Stick margarine. Lard. Shortening. Ghee. Bacon fat. Tropical oils, such as coconut, palm kernel, or palm oil. Seasoning and other foods Salted popcorn and pretzels. Onion salt, garlic salt, seasoned salt, table salt, and sea salt. Worcestershire sauce. Tartar sauce. Barbecue sauce. Teriyaki sauce. Soy sauce, including reduced-sodium. Steak sauce. Canned and packaged gravies. Fish sauce. Oyster sauce. Cocktail sauce. Horseradish that you find on the shelf. Ketchup. Mustard. Meat flavorings and tenderizers. Bouillon cubes. Hot sauce and Tabasco sauce. Premade or packaged marinades. Premade or packaged taco seasonings. Relishes. Regular salad dressings. Where to find more information:  National Heart, Lung, and Blood Institute: www.nhlbi.nih.gov  American Heart Association: www.heart.org Summary  The DASH eating plan is a healthy eating plan that has been shown to reduce high blood pressure (hypertension). It may also reduce your risk for type 2 diabetes, heart disease, and stroke.  With the DASH eating plan, you should limit salt (sodium) intake to 2,300 mg a day. If you have hypertension, you may need to reduce your sodium intake to 1,500 mg a day.  When on the DASH eating plan, aim to eat more fresh fruits and vegetables, whole grains, lean proteins, low-fat dairy, and heart-healthy fats.  Work with your health care provider or diet and nutrition specialist (dietitian) to adjust your eating plan to your   individual calorie needs. This information is not intended to replace advice given to you by your health care provider. Make sure you discuss any questions you have with your health care provider. Document Revised: 03/20/2017 Document Reviewed: 03/31/2016 Elsevier Patient Education  2020 [employer].  

## 2019-12-22 NOTE — Progress Notes (Signed)
Established Patient Office Visit  Subjective:  Patient ID: Tracey Perez, female    DOB: 04-10-1966  Age: 54 y.o. MRN: [id number]  CC: No chief complaint on file.   HPI XIAO GRAUL presents for hypertension and non productive cough. She reports that she's compliant with her medications and adheres to DASH diet and exercises as tolerated. She had an eye exam done on 11/18/2019 and had prescription for eye glasses which the Clinic will help fill for her. She states that she checks her blood pressure every other day and it's usually in the 140's/ 90. Currently, she c/o intermittent non productive cough that has been going on for 2 weeks, she quit's smoking in 2013. She states that cough is not waking her up at night, but it's bothersome. She denies any sick contacts, upper respiratory symptoms, fever and chills. She denies taking any medication. She reports that she will follow up with Endocrinology next week. She continues on Eliquis 5 mg bid for history of pulmonary embolism and lower extremity DVT, she reports of occasional bruising, but denies hematuria, hematochezia and active bleeding. Overall, she states that she's doing well and offers no further complaint.  Past Medical History:  Diagnosis Date  . Anemia    Ferritin anemia   . Anxiety   . Complication of anesthesia   . Depression   . DVT (deep vein thrombosis) in pregnancy   . Headache    chronic migraines   . Hypertension   . PONV (postoperative nausea and vomiting)   . Pulmonary embolism ([place])    hx of last one 10/20 hospitalized for one day at [facility] per patient followed by Dr Janese Banks   . Sleep apnea    does not uses cpap     Past Surgical History:  Procedure Laterality Date  . ABDOMINAL HYSTERECTOMY    . CESAREAN SECTION    . laparoscopic surgery     . THYROID LOBECTOMY Right 08/08/2019   Procedure: RIGHT THYROID LOBECTOMY;  Surgeon: Armandina Gemma, MD;  Location: [facility];  Service: General;  Laterality: Right;  .  TONSILLECTOMY      Family History  Problem Relation Age of Onset  . Non-Hodgkin's lymphoma Mother   . Hypertension Father   . Depression Father   . Alcohol abuse Father   . Migraines Father   . Multiple sclerosis Sister   . Migraines Sister   . Migraines Brother   . Thyroid disease Brother   . Migraines Brother   . Thyroid disease Brother   . Thyroid disease Brother   . Thyroid disease Brother     Social History   Socioeconomic History  . Marital status: Divorced    Spouse name: Not on file  . Number of children: 1  . Years of education: 84  . Highest education level: Not on file  Occupational History  . Not on file  Tobacco Use  . Smoking status: Former Smoker    Packs/day: 1.00    Years: 25.00    Pack years: 25.00    Types: Cigarettes    Quit date: 12/17/2011    Years since quitting: 8.0  . Smokeless tobacco: Never Used  Vaping Use  . Vaping Use: Never used  Substance and Sexual Activity  . Alcohol use: Never  . Drug use: Never  . Sexual activity: Not on file  Other Topics Concern  . Not on file  Social History Narrative  . Not on file   Social Determinants of Health  Financial Resource Strain: High Risk  . Difficulty of Paying Living Expenses: Very hard  Food Insecurity: No Food Insecurity  . Worried About Charity fundraiser in the Last Year: Never true  . Ran Out of Food in the Last Year: Never true  Transportation Needs: No Transportation Needs  . Lack of Transportation (Medical): No  . Lack of Transportation (Non-Medical): No  Physical Activity:   . Days of Exercise per Week: Not on file  . Minutes of Exercise per Session: Not on file  Stress: No Stress Concern Present  . Feeling of Stress : Not at all  Social Connections: Unknown  . Frequency of Communication with Friends and Family: Once a week  . Frequency of Social Gatherings with Friends and Family: Not on file  . Attends Religious Services: Never  . Active Member of Clubs or  Organizations: No  . Attends Archivist Meetings: Not on file  . Marital Status: Divorced  Human resources officer Violence: Not At Risk  . Fear of Current or Ex-Partner: No  . Emotionally Abused: No  . Physically Abused: No  . Sexually Abused: No    Outpatient Medications Prior to Visit  Medication Sig Dispense Refill  . apixaban (ELIQUIS) 5 MG TABS tablet Take 1 tablet (5 mg total) by mouth 2 (two) times daily. one tab po twice a day afterwards 60 tablet 2  . Blood Pressure Monitoring (BLOOD PRESSURE KIT) KIT 1 kit by Does not apply route 2 (two) times daily. 1 kit 0  . Cholecalciferol (VITAMIN D) 125 MCG (5000 UT) CAPS Take 10,000 Units by mouth at bedtime.     . diphenhydrAMINE (BENADRYL) 25 mg capsule Take 50 mg by mouth 4 (four) times daily as needed for allergies or sleep. Patient taking 100 mg QHS as needed.    . fluticasone (FLONASE) 50 MCG/ACT nasal spray Place 1 spray into both nostrils daily. (Patient not taking: Reported on 06/06/2019) 16 g 2  . gabapentin (NEURONTIN) 300 MG capsule Take 300 mg by mouth 3 (three) times daily.    . hydrALAZINE (APRESOLINE) 25 MG tablet Take 1 tablet (25 mg total) by mouth daily. (Patient taking differently: Take 25 mg by mouth every evening. ) 90 tablet 1  . levothyroxine (SYNTHROID) 25 MCG tablet Take 1 tablet (25 mcg total) by mouth daily before breakfast. 30 tablet 0  . Melatonin 10 MG TABS Take 10 mg by mouth at bedtime.    . mirtazapine (REMERON) 30 MG tablet Take 15 mg by mouth at bedtime.     Marland Kitchen oxycodone (ROXICODONE) 30 MG immediate release tablet Take 30 mg by mouth 5 (five) times daily.    Marland Kitchen tiZANidine (ZANAFLEX) 4 MG tablet Take 12-16 mg by mouth at bedtime.     Marland Kitchen venlafaxine XR (EFFEXOR-XR) 150 MG 24 hr capsule Take 300 mg by mouth daily.    Marland Kitchen zolpidem (AMBIEN) 10 MG tablet Take 10 mg by mouth at bedtime.      No facility-administered medications prior to visit.    Allergies  Allergen Reactions  . Morphine And Related Hives   . Nsaids Other (See Comments)    Affects platelet count  . Sulfa Antibiotics Shortness Of Breath  . Tape Other (See Comments)    bruises skin  . Verapamil Swelling  . Lisinopril Other (See Comments)    Burning throat, metallic taste  . Other Itching    Pt states she is allergic to the plastic that IV bags are made of. Reaction  causes itching relieved by Benadryl.   . Acetazolamide Other (See Comments)    Acid reflux   . Seroquel [Quetiapine Fumarate] Other (See Comments)    Muscle cramps    ROS Review of Systems  Constitutional: Negative.   Eyes: Negative.   Respiratory: Positive for cough (non productive cough).   Cardiovascular: Negative.   Neurological: Negative.   Hematological: Bruises/bleeds easily.  Psychiatric/Behavioral: Negative.       Objective:    Physical Exam HENT:     Head: Normocephalic and atraumatic.  Cardiovascular:     Rate and Rhythm: Normal rate and regular rhythm.     Pulses: Normal pulses.     Heart sounds: Normal heart sounds.  Pulmonary:     Effort: Pulmonary effort is normal.     Breath sounds: Normal breath sounds.  Skin:    Findings: Bruising (2-3 small bruised area to arms) present.  Neurological:     General: No focal deficit present.     Mental Status: She is alert and oriented to person, place, and time. Mental status is at baseline.  Psychiatric:        Mood and Affect: Mood normal.        Behavior: Behavior normal.        Thought Content: Thought content normal.        Judgment: Judgment normal.     BP (!) 148/90 (BP Location: Left Arm, Patient Position: Sitting)   Pulse 99   Wt 175 lb (79.4 kg)   SpO2 92%   BMI 31.00 kg/m  Wt Readings from Last 3 Encounters:  12/22/19 175 lb (79.4 kg)  09/21/19 186 lb (84.4 kg)  09/16/19 181 lb 12.8 oz (82.5 kg)   She lost 11 pounds in 3 months, she was advised to continue her weight loss regimen.  Health Maintenance Due  Topic Date Due  . Hepatitis C Screening  Never done  .  COVID-19 Vaccine (1) Never done  . PAP SMEAR-Modifier  Never done  . MAMMOGRAM  Never done  . COLONOSCOPY  Never done  . INFLUENZA VACCINE  11/20/2019    There are no preventive care reminders to display for this patient.  Lab Results  Component Value Date   TSH 7.240 (H) 09/14/2019   Lab Results  Component Value Date   WBC 5.3 09/14/2019   HGB 13.2 09/14/2019   HCT 39.5 09/14/2019   MCV 83 09/14/2019   PLT 297 09/14/2019   Lab Results  Component Value Date   NA 141 08/08/2019   K 3.8 08/08/2019   CO2 25 08/08/2019   GLUCOSE 97 08/08/2019   BUN 15 08/08/2019   CREATININE 0.94 08/08/2019   CALCIUM 9.9 08/08/2019   ANIONGAP 12 08/08/2019   Lab Results  Component Value Date   CHOL 200 (H) 09/14/2019   Lab Results  Component Value Date   HDL 45 09/14/2019   Lab Results  Component Value Date   LDLCALC 100 (H) 09/14/2019   Lab Results  Component Value Date   TRIG 325 (H) 09/14/2019   Lab Results  Component Value Date   CHOLHDL 4.4 09/14/2019   Lab Results  Component Value Date   HGBA1C 5.6 06/15/2019      Assessment & Plan:    1. Essential hypertension - Her blood pressure is not under control, her goal should be less than 140/90. She will continue on current treatment regimen and advised on DASH diet and exercise as tolerated.  2. Cough - She  will continue on Benzonatate prn and advised to notify clinic for worsening symptoms. - benzonatate (TESSALON PERLES) 100 MG capsule; Take 1 capsule (100 mg total) by mouth 3 (three) times daily as needed for cough.  Dispense: 20 capsule; Refill: 0  3. Health care maintenance -She was provided with [facility] Flyer and advised to schedule Mammogram appointment. - Ambulatory referral to Hematology / Oncology  4. Bruises easily - She will continue Eliquis, was advised to go to the ED for worsening bruises, hematuria, hematochezia and active bleeding.    Follow-up: Return in about 3 months (around 03/28/2020), or if  symptoms worsen or fail to improve.    Brysten Reister Jerold Coombe, NP

## 2019-12-28 ENCOUNTER — Other Ambulatory Visit: Payer: Self-pay

## 2019-12-28 DIAGNOSIS — E890 Postprocedural hypothyroidism: Secondary | ICD-10-CM

## 2019-12-29 ENCOUNTER — Encounter: Payer: Self-pay | Admitting: "Endocrinology

## 2019-12-29 ENCOUNTER — Ambulatory Visit (INDEPENDENT_AMBULATORY_CARE_PROVIDER_SITE_OTHER): Payer: Self-pay | Admitting: "Endocrinology

## 2019-12-29 VITALS — BP 124/86 | HR 104 | Ht 63.0 in | Wt 175.4 lb

## 2019-12-29 DIAGNOSIS — E890 Postprocedural hypothyroidism: Secondary | ICD-10-CM

## 2019-12-29 LAB — TSH: TSH: 4.8 u[IU]/mL — ABNORMAL HIGH (ref 0.450–4.500)

## 2019-12-29 LAB — T4, FREE: Free T4: 0.92 ng/dL (ref 0.82–1.77)

## 2019-12-29 MED ORDER — LEVOTHYROXINE SODIUM 50 MCG PO TABS: 50.0000 ug | ORAL_TABLET | Freq: Every day | ORAL | 1 refills | Status: DC

## 2019-12-29 NOTE — Progress Notes (Signed)
12/29/2019, 4:57 PM      Endocrinology follow-up note   Subjective:    Patient ID: Tracey Perez, female    DOB: 04/10/1966, PCP Langston Reusing, NP   Past Medical History:  Diagnosis Date  . Anemia    Ferritin anemia   . Anxiety   . Complication of anesthesia   . Depression   . DVT (deep vein thrombosis) in pregnancy   . Headache    chronic migraines   . Hypertension   . PONV (postoperative nausea and vomiting)   . Pulmonary embolism ([place])    hx of last one 10/20 hospitalized for one day at [facility] per patient followed by Dr Janese Banks   . Sleep apnea    does not uses cpap    Past Surgical History:  Procedure Laterality Date  . ABDOMINAL HYSTERECTOMY    . CESAREAN SECTION    . laparoscopic surgery     . THYROID LOBECTOMY Right 08/08/2019   Procedure: RIGHT THYROID LOBECTOMY;  Surgeon: Armandina Gemma, MD;  Location: [facility];  Service: General;  Laterality: Right;  . TONSILLECTOMY     Social History   Socioeconomic History  . Marital status: Divorced    Spouse name: Not on file  . Number of children: 1  . Years of education: 44  . Highest education level: Not on file  Occupational History  . Not on file  Tobacco Use  . Smoking status: Former Smoker    Packs/day: 1.00    Years: 25.00    Pack years: 25.00    Types: Cigarettes    Quit date: 12/17/2011    Years since quitting: 8.0  . Smokeless tobacco: Never Used  Vaping Use  . Vaping Use: Never used  Substance and Sexual Activity  . Alcohol use: Never  . Drug use: Never  . Sexual activity: Not on file  Other Topics Concern  . Not on file  Social History Narrative  . Not on file   Social Determinants of Health   Financial Resource Strain: High Risk  . Difficulty of Paying Living Expenses: Very hard  Food Insecurity: No Food Insecurity  . Worried About Charity fundraiser in the Last Year: Never true  . Ran Out of Food in the Last Year: Never true   Transportation Needs: No Transportation Needs  . Lack of Transportation (Medical): No  . Lack of Transportation (Non-Medical): No  Physical Activity:   . Days of Exercise per Week: Not on file  . Minutes of Exercise per Session: Not on file  Stress: No Stress Concern Present  . Feeling of Stress : Not at all  Social Connections: Unknown  . Frequency of Communication with Friends and Family: Once a week  . Frequency of Social Gatherings with Friends and Family: Not on file  . Attends Religious Services: Never  . Active Member of Clubs or Organizations: No  . Attends Archivist Meetings: Not on file  . Marital Status: Divorced   Family History  Problem Relation Age of Onset  . Non-Hodgkin's lymphoma Mother   . Hypertension Father   . Depression Father   . Alcohol abuse Father   . Migraines Father   .  Multiple sclerosis Sister   . Migraines Sister   . Migraines Brother   . Thyroid disease Brother   . Migraines Brother   . Thyroid disease Brother   . Thyroid disease Brother   . Thyroid disease Brother    Outpatient Encounter Medications as of 12/29/2019  Medication Sig  . apixaban (ELIQUIS) 5 MG TABS tablet Take 1 tablet (5 mg total) by mouth 2 (two) times daily. one tab po twice a day afterwards  . Blood Pressure Monitoring (BLOOD PRESSURE KIT) KIT 1 kit by Does not apply route 2 (two) times daily.  . Cholecalciferol (VITAMIN D) 125 MCG (5000 UT) CAPS Take 10,000 Units by mouth at bedtime.   . diphenhydrAMINE (BENADRYL) 25 mg capsule Take 50 mg by mouth 4 (four) times daily as needed for allergies or sleep. Patient taking 100 mg QHS as needed.  . gabapentin (NEURONTIN) 300 MG capsule Take 300 mg by mouth 3 (three) times daily.  . hydrALAZINE (APRESOLINE) 25 MG tablet Take 1 tablet (25 mg total) by mouth daily. (Patient taking differently: Take 25 mg by mouth every evening. )  . levothyroxine (SYNTHROID) 50 MCG tablet Take 1 tablet (50 mcg total) by mouth daily before  breakfast.  . Melatonin 10 MG TABS Take 10 mg by mouth at bedtime.  . mirtazapine (REMERON) 30 MG tablet Take 15 mg by mouth at bedtime.   Marland Kitchen oxycodone (ROXICODONE) 30 MG immediate release tablet Take 30 mg by mouth 5 (five) times daily.  Marland Kitchen tiZANidine (ZANAFLEX) 4 MG tablet Take 12-16 mg by mouth at bedtime.   Marland Kitchen venlafaxine XR (EFFEXOR-XR) 150 MG 24 hr capsule Take 300 mg by mouth daily.  Marland Kitchen zolpidem (AMBIEN) 10 MG tablet Take 10 mg by mouth at bedtime.   . [DISCONTINUED] benzonatate (TESSALON PERLES) 100 MG capsule Take 1 capsule (100 mg total) by mouth 3 (three) times daily as needed for cough.  . [DISCONTINUED] fluticasone (FLONASE) 50 MCG/ACT nasal spray Place 1 spray into both nostrils daily. (Patient not taking: Reported on 06/06/2019)  . [DISCONTINUED] levothyroxine (SYNTHROID) 25 MCG tablet Take 1 tablet (25 mcg total) by mouth daily before breakfast.   No facility-administered encounter medications on file as of 12/29/2019.   ALLERGIES: Allergies  Allergen Reactions  . Morphine And Related Hives  . Nsaids Other (See Comments)    Affects platelet count  . Sulfa Antibiotics Shortness Of Breath  . Tape Other (See Comments)    bruises skin  . Verapamil Swelling  . Lisinopril Other (See Comments)    Burning throat, metallic taste  . Other Itching    Pt states she is allergic to the plastic that IV bags are made of. Reaction causes itching relieved by Benadryl.   . Acetazolamide Other (See Comments)    Acid reflux   . Seroquel [Quetiapine Fumarate] Other (See Comments)    Muscle cramps    VACCINATION STATUS: Immunization History  Administered Date(s) Administered  . Influenza,inj,Quad PF,6+ Mos 02/16/2019  . Influenza-Unspecified 01/25/2012, 01/13/2013, 01/05/2014, 01/29/2015, 02/07/2016  . Tdap 12/09/2010    HPI Tracey Perez is 54 y.o. female who is being seen in follow-up after she underwent right hemithyroidectomy for abnormal thyroid biopsies.   PCP:  Langston Reusing, NP.   She underwent hemithyroidectomy on August 08, 2019.  Her surgical sample was negative for malignancy.  She was for postsurgical hypothyroidism during her last visit.  She continues to tolerate levothyroxine 25 mcg p.o. daily before breakfast.  Her previsit thyroid function  tests are improving, however still consistent with under replacement.    Patient denies any dysphagia, shortness of breath, no voice change.  She has lost 11 pounds overall.  She denies  palpitations, tremors, heat/cold intolerance.  She has some family history of thyroid dysfunction, however no family history of thyroid malignancy.  She is a former smoker quit in 2013. Her recent thyroid function tests were within normal limits.  Review of Systems Limited as above.  Objective:    BP 124/86   Pulse (!) 104   Ht $R'5\' 3"'xo$  (1.6 m)   Wt 175 lb 6.4 oz (79.6 kg)   BMI 31.07 kg/m   Wt Readings from Last 3 Encounters:  12/29/19 175 lb 6.4 oz (79.6 kg)  12/22/19 175 lb (79.4 kg)  09/21/19 186 lb (84.4 kg)    Physical Exam   Physical Exam- Limited  Constitutional:  Body mass index is 31.07 kg/m. , not in acute distress, normal state of mind Eyes:  EOMI, no exophthalmos Neck: Supple Thyroid: Healing surgical scar on anterior lower neck.   Respiratory: Adequate breathing efforts Musculoskeletal: no gross deformities, strength intact in all four extremities, no gross restriction of joint movements Skin:  no rashes, no hyperemia Neurological: no tremor with outstretched hands,   CMP     Component Value Date/Time   NA 141 08/08/2019 0811   K 3.8 08/08/2019 0811   CL 104 08/08/2019 0811   CO2 25 08/08/2019 0811   GLUCOSE 97 08/08/2019 0811   BUN 15 08/08/2019 0811   CREATININE 0.94 08/08/2019 0811   CALCIUM 9.9 08/08/2019 0811   GFRNONAA >60 08/08/2019 0811   GFRAA >60 08/08/2019 0811     Diabetic Labs (most recent): Lab Results  Component Value Date   HGBA1C 5.6 06/15/2019   HGBA1C 5.8 (H)  02/16/2019     Lab Results  Component Value Date   TSH 4.800 (H) 12/28/2019   TSH 7.240 (H) 09/14/2019   TSH 1.040 02/16/2019   FREET4 0.92 12/28/2019     Assessment & Plan:   1.  Postsurgical hypothyroidism 2.  Status post right hemithyroidectomy Her surgical sample was negative for malignancy.  Prior to her surgery, due to abnormal cytology on her thyroid biopsy, and 50% malignancy risk on Afirma she was sent for surgery. Her previsit thyroid function tests are consistent with inadequate replacement.  I discussed and increase her levothyroxine to 50 mcg p.o. daily before breakfast.   - We discussed about the correct intake of her thyroid hormone, on empty stomach at fasting, with water, separated by at least 30 minutes from breakfast and other medications,  and separated by more than 4 hours from calcium, iron, multivitamins, acid reflux medications (PPIs). -Patient is made aware of the fact that thyroid hormone replacement is needed for life, dose to be adjusted by periodic monitoring of thyroid function tests.    She had only right hemithyroidectomy, will need a follow-up thyroid ultrasound in her neck in 1 to 2 years.  - she is advised to maintain close follow up with [place], Chioma E, NP for primary care needs.     - Time spent on this patient care encounter:  20 minutes of which 50% was spent in  counseling and the rest reviewing  her current and  previous labs / studies and medications  doses and developing a plan for long term care. Tracey Perez  participated in the discussions, expressed understanding, and voiced agreement with the above plans.  All questions  were answered to her satisfaction. she is encouraged to contact clinic should she have any questions or concerns prior to her return visit.    Follow up plan: Return in about 6 months (around 06/27/2020) for F/U with Pre-visit Labs.   Glade Lloyd, MD [facility] [address] [place], [place] [address] Phone: [phone]  Fax: [phone]     12/29/2019, 4:57 PM  This note was partially dictated with voice recognition software. Similar sounding words can be transcribed inadequately or may not  be corrected upon review.

## 2020-01-24 ENCOUNTER — Encounter: Payer: Self-pay | Admitting: Oncology

## 2020-01-24 ENCOUNTER — Other Ambulatory Visit: Payer: Self-pay

## 2020-01-24 DIAGNOSIS — D509 Iron deficiency anemia, unspecified: Secondary | ICD-10-CM

## 2020-01-31 ENCOUNTER — Ambulatory Visit
Admission: RE | Admit: 2020-01-31 | Discharge: 2020-01-31 | Disposition: A | Discharge status: Home / Self Care | Payer: Self-pay | Source: Ambulatory Visit | Attending: Oncology | Admitting: Oncology

## 2020-01-31 ENCOUNTER — Other Ambulatory Visit: Payer: Self-pay

## 2020-01-31 ENCOUNTER — Inpatient Hospital Stay: Payer: Self-pay | Attending: Oncology

## 2020-01-31 DIAGNOSIS — F418 Other specified anxiety disorders: Secondary | ICD-10-CM | POA: Insufficient documentation

## 2020-01-31 DIAGNOSIS — R911 Solitary pulmonary nodule: Secondary | ICD-10-CM | POA: Insufficient documentation

## 2020-01-31 DIAGNOSIS — Z79899 Other long term (current) drug therapy: Secondary | ICD-10-CM | POA: Insufficient documentation

## 2020-01-31 DIAGNOSIS — Z86718 Personal history of other venous thrombosis and embolism: Secondary | ICD-10-CM | POA: Insufficient documentation

## 2020-01-31 DIAGNOSIS — D509 Iron deficiency anemia, unspecified: Secondary | ICD-10-CM | POA: Insufficient documentation

## 2020-01-31 DIAGNOSIS — I10 Essential (primary) hypertension: Secondary | ICD-10-CM | POA: Insufficient documentation

## 2020-01-31 DIAGNOSIS — G4730 Sleep apnea, unspecified: Secondary | ICD-10-CM | POA: Insufficient documentation

## 2020-01-31 DIAGNOSIS — Z86711 Personal history of pulmonary embolism: Secondary | ICD-10-CM | POA: Insufficient documentation

## 2020-01-31 DIAGNOSIS — Z87891 Personal history of nicotine dependence: Secondary | ICD-10-CM | POA: Insufficient documentation

## 2020-01-31 LAB — CBC WITH DIFFERENTIAL/PLATELET
Abs Immature Granulocytes: 0.02 10*3/uL (ref 0.00–0.07)
Basophils Absolute: 0 10*3/uL (ref 0.0–0.1)
Basophils Relative: 1 %
Eosinophils Absolute: 0.4 10*3/uL (ref 0.0–0.5)
Eosinophils Relative: 6 %
HCT: 36.4 % (ref 36.0–46.0)
Hemoglobin: 12.6 g/dL (ref 12.0–15.0)
Immature Granulocytes: 0 %
Lymphocytes Relative: 21 %
Lymphs Abs: 1.3 10*3/uL (ref 0.7–4.0)
MCH: 28.3 pg (ref 26.0–34.0)
MCHC: 34.6 g/dL (ref 30.0–36.0)
MCV: 81.6 fL (ref 80.0–100.0)
Monocytes Absolute: 0.4 10*3/uL (ref 0.1–1.0)
Monocytes Relative: 6 %
Neutro Abs: 4.2 10*3/uL (ref 1.7–7.7)
Neutrophils Relative %: 66 %
Platelets: 284 10*3/uL (ref 150–400)
RBC: 4.46 MIL/uL (ref 3.87–5.11)
RDW: 13.2 % (ref 11.5–15.5)
WBC: 6.3 10*3/uL (ref 4.0–10.5)
nRBC: 0 % (ref 0.0–0.2)

## 2020-01-31 LAB — FERRITIN: Ferritin: 38 ng/mL (ref 11–307)

## 2020-02-03 ENCOUNTER — Other Ambulatory Visit: Payer: Self-pay

## 2020-02-03 ENCOUNTER — Encounter: Payer: Self-pay | Admitting: Oncology

## 2020-02-03 ENCOUNTER — Inpatient Hospital Stay (HOSPITAL_BASED_OUTPATIENT_CLINIC_OR_DEPARTMENT_OTHER): Payer: Self-pay | Admitting: Oncology

## 2020-02-03 VITALS — BP 126/86 | HR 92 | Temp 97.4°F | Resp 16 | Wt 173.2 lb

## 2020-02-03 DIAGNOSIS — D509 Iron deficiency anemia, unspecified: Secondary | ICD-10-CM

## 2020-02-03 DIAGNOSIS — R911 Solitary pulmonary nodule: Secondary | ICD-10-CM

## 2020-02-08 ENCOUNTER — Inpatient Hospital Stay: Payer: Self-pay

## 2020-02-08 NOTE — Progress Notes (Signed)
Hematology/Oncology Consult note [facility]  [phone] [phone]  Patient Care Team: Langston Reusing, NP as PCP - General (Gerontology)   Name of the patient: Tracey Perez  [id number]  1965/11/12   Date of visit: 02/08/20  Diagnosis- History of recent pulmonary embolism and left lower extremity DVT back in 2003  Chief complaint/ Reason for visit-discuss CT scan results  Heme/Onc history: patient is a 54 year old female with a past medical history significant for migraine, hypertension among other medical problems. She had a prior history of left lower extremity DVT back in 2003 when she took Coumadin for a year and then stopped it. That was an unprovoked episode and there was no precipitating factors such as prolonged air road travel, infections hospitalization or surgery. Patient was doing well up until 2020 when she experienced exertional shortness of breath which lasted for a week and did not get better. She went to the ER and underwent a CT chest which showed bilateral PE without right heart strain. Left Lower extremity Doppler was also done given her symptoms of swelling and was negative for DVT. Patient was discharged on Eliquis. Patient does not have any insurance and is currently getting Eliquis through [facility]. She reports some itching since she started Eliquis.No history of any pregnancy losses. No family history of DVT or PE. She is currently not on any home oxygen  Hypercoagulable work-up did not reveal any evidence of prothrombin gene mutation, factor V Leiden mutation.  Antiphospholipid antibody syndrome test was negative.  Protein C, protein S, Antithrombin III levels were normal.   Interval history-patient reports compliance with Eliquis and denies any significant side effects at this time other than mild chronic fatigue  ECOG PS- 1 Pain scale- 0   Review of systems- Review of Systems    Constitutional: Positive for malaise/fatigue. Negative for chills, fever and weight loss.  HENT: Negative for congestion, ear discharge and nosebleeds.   Eyes: Negative for blurred vision.  Respiratory: Negative for cough, hemoptysis, sputum production, shortness of breath and wheezing.   Cardiovascular: Negative for chest pain, palpitations, orthopnea and claudication.  Gastrointestinal: Negative for abdominal pain, blood in stool, constipation, diarrhea, heartburn, melena, nausea and vomiting.  Genitourinary: Negative for dysuria, flank pain, frequency, hematuria and urgency.  Musculoskeletal: Negative for back pain, joint pain and myalgias.  Skin: Negative for rash.  Neurological: Negative for dizziness, tingling, focal weakness, seizures, weakness and headaches.  Endo/Heme/Allergies: Does not bruise/bleed easily.  Psychiatric/Behavioral: Negative for depression and suicidal ideas. The patient does not have insomnia.       Allergies  Allergen Reactions  . Morphine And Related Hives  . Nsaids Other (See Comments)    Affects platelet count  . Sulfa Antibiotics Shortness Of Breath  . Tape Other (See Comments)    bruises skin  . Verapamil Swelling  . Lisinopril Other (See Comments)    Burning throat, metallic taste  . Other Itching    Pt states she is allergic to the plastic that IV bags are made of. Reaction causes itching relieved by Benadryl.   . Acetazolamide Other (See Comments)    Acid reflux   . Seroquel [Quetiapine Fumarate] Other (See Comments)    Muscle cramps     Past Medical History:  Diagnosis Date  . Anemia    Ferritin anemia   . Anxiety   . Complication of anesthesia   . Depression   . DVT (deep vein thrombosis) in pregnancy   .  Headache    chronic migraines   . Hypertension   . PONV (postoperative nausea and vomiting)   . Pulmonary embolism ([place])    hx of last one 10/20 hospitalized for one day at [facility] per patient followed by Dr Janese Banks   . Sleep apnea     does not uses cpap      Past Surgical History:  Procedure Laterality Date  . ABDOMINAL HYSTERECTOMY    . CESAREAN SECTION    . laparoscopic surgery     . THYROID LOBECTOMY Right 08/08/2019   Procedure: RIGHT THYROID LOBECTOMY;  Surgeon: Armandina Gemma, MD;  Location: [facility];  Service: General;  Laterality: Right;  . TONSILLECTOMY      Social History   Socioeconomic History  . Marital status: Divorced    Spouse name: Not on file  . Number of children: 1  . Years of education: 77  . Highest education level: Not on file  Occupational History  . Not on file  Tobacco Use  . Smoking status: Former Smoker    Packs/day: 1.00    Years: 25.00    Pack years: 25.00    Types: Cigarettes    Quit date: 12/17/2011    Years since quitting: 8.1  . Smokeless tobacco: Never Used  Vaping Use  . Vaping Use: Never used  Substance and Sexual Activity  . Alcohol use: Never  . Drug use: Never  . Sexual activity: Not on file  Other Topics Concern  . Not on file  Social History Narrative  . Not on file   Social Determinants of Health   Financial Resource Strain: High Risk  . Difficulty of Paying Living Expenses: Very hard  Food Insecurity: No Food Insecurity  . Worried About Charity fundraiser in the Last Year: Never true  . Ran Out of Food in the Last Year: Never true  Transportation Needs: No Transportation Needs  . Lack of Transportation (Medical): No  . Lack of Transportation (Non-Medical): No  Physical Activity:   . Days of Exercise per Week: Not on file  . Minutes of Exercise per Session: Not on file  Stress: No Stress Concern Present  . Feeling of Stress : Not at all  Social Connections: Unknown  . Frequency of Communication with Friends and Family: Once a week  . Frequency of Social Gatherings with Friends and Family: Not on file  . Attends Religious Services: Never  . Active Member of Clubs or Organizations: No  . Attends Archivist Meetings: Not on file  .  Marital Status: Divorced  Human resources officer Violence: Not At Risk  . Fear of Current or Ex-Partner: No  . Emotionally Abused: No  . Physically Abused: No  . Sexually Abused: No    Family History  Problem Relation Age of Onset  . Non-Hodgkin's lymphoma Mother   . Hypertension Father   . Depression Father   . Alcohol abuse Father   . Migraines Father   . Multiple sclerosis Sister   . Migraines Sister   . Migraines Brother   . Thyroid disease Brother   . Migraines Brother   . Thyroid disease Brother   . Thyroid disease Brother   . Thyroid disease Brother      Current Outpatient Medications:  .  apixaban (ELIQUIS) 5 MG TABS tablet, Take 1 tablet (5 mg total) by mouth 2 (two) times daily. one tab po twice a day afterwards, Disp: 60 tablet, Rfl: 2 .  Blood Pressure Monitoring (BLOOD  PRESSURE KIT) KIT, 1 kit by Does not apply route 2 (two) times daily., Disp: 1 kit, Rfl: 0 .  Cholecalciferol (VITAMIN D) 125 MCG (5000 UT) CAPS, Take 10,000 Units by mouth at bedtime. , Disp: , Rfl:  .  diphenhydrAMINE (BENADRYL) 25 mg capsule, Take 50 mg by mouth 4 (four) times daily as needed for allergies or sleep. Patient taking 100 mg QHS as needed., Disp: , Rfl:  .  gabapentin (NEURONTIN) 300 MG capsule, Take 300 mg by mouth 3 (three) times daily., Disp: , Rfl:  .  hydrALAZINE (APRESOLINE) 25 MG tablet, Take 1 tablet (25 mg total) by mouth daily. (Patient taking differently: Take 25 mg by mouth every evening. ), Disp: 90 tablet, Rfl: 1 .  levothyroxine (SYNTHROID) 50 MCG tablet, Take 1 tablet (50 mcg total) by mouth daily before breakfast., Disp: 90 tablet, Rfl: 1 .  Melatonin 10 MG TABS, Take 10 mg by mouth at bedtime., Disp: , Rfl:  .  mirtazapine (REMERON) 30 MG tablet, Take 15 mg by mouth at bedtime. , Disp: , Rfl:  .  oxycodone (ROXICODONE) 30 MG immediate release tablet, Take 30 mg by mouth 5 (five) times daily., Disp: , Rfl:  .  tiZANidine (ZANAFLEX) 4 MG tablet, Take 12-16 mg by mouth at  bedtime. , Disp: , Rfl:  .  venlafaxine XR (EFFEXOR-XR) 150 MG 24 hr capsule, Take 300 mg by mouth daily., Disp: , Rfl:  .  zolpidem (AMBIEN) 10 MG tablet, Take 10 mg by mouth at bedtime. , Disp: , Rfl:   Physical exam:  Vitals:   02/03/20 1034  BP: 126/86  Pulse: 92  Resp: 16  Temp: (!) 97.4 F (36.3 C)  TempSrc: Tympanic  SpO2: 95%  Weight: 173 lb 3.2 oz (78.6 kg)   Physical Exam Constitutional:      General: She is not in acute distress. Eyes:     Pupils: Pupils are equal, round, and reactive to light.  Cardiovascular:     Rate and Rhythm: Normal rate and regular rhythm.     Heart sounds: Normal heart sounds.  Pulmonary:     Effort: Pulmonary effort is normal.     Breath sounds: Normal breath sounds.  Abdominal:     General: Bowel sounds are normal.     Palpations: Abdomen is soft.  Skin:    General: Skin is warm and dry.  Neurological:     Mental Status: She is alert and oriented to person, place, and time.      CMP Latest Ref Rng & Units 08/08/2019  Glucose 70 - 99 mg/dL 97  BUN 6 - 20 mg/dL 15  Creatinine 0.44 - 1.00 mg/dL 0.94  Sodium 135 - 145 mmol/L 141  Potassium 3.5 - 5.1 mmol/L 3.8  Chloride 98 - 111 mmol/L 104  CO2 22 - 32 mmol/L 25  Calcium 8.9 - 10.3 mg/dL 9.9   CBC Latest Ref Rng & Units 01/31/2020  WBC 4.0 - 10.5 K/uL 6.3  Hemoglobin 12.0 - 15.0 g/dL 12.6  Hematocrit 36 - 46 % 36.4  Platelets 150 - 400 K/uL 284    No images are attached to the encounter.  CT Chest Wo Contrast  Result Date: 01/31/2020 CLINICAL DATA:  Lung nodule. EXAM: CT CHEST WITHOUT CONTRAST TECHNIQUE: Multidetector CT imaging of the chest was performed following the standard protocol without IV contrast. COMPARISON:  02/02/2019. FINDINGS: Cardiovascular: Heart size normal.  No pericardial effusion. Mediastinum/Nodes: Right thyroidectomy. Mediastinal lymph nodes are not enlarged by CT  size criteria. Hilar regions are difficult to evaluate without IV contrast. No axillary  adenopathy. Esophagus is grossly unremarkable. Large hiatal hernia. Lungs/Pleura: Biapical pleuroparenchymal scarring. Fairly diffuse ill-defined centrilobular nodularity with areas of more focal ground-glass and consolidation in the right lower lobe, new from 02/02/2019. 4 mm left upper lobe nodule (3/40), unchanged and benign. Interval near complete resolution of subpleural consolidation and ground-glass previously seen in the lateral segment right middle lobe and lateral right lower lobe, with residual postinflammatory scarring. No pleural fluid. Airway is unremarkable. Upper Abdomen: Visualized portion of the liver is decreased in attenuation diffusely. Visualized portions of the gallbladder, adrenal glands, kidneys, spleen and pancreas are otherwise unremarkable. Large hiatal hernia. Musculoskeletal: Degenerative changes in the spine. No worrisome lytic or sclerotic lesions. IMPRESSION: 1. Fairly diffuse centrilobular nodularity with more focal areas of ground-glass and nodular consolidation in the right lower lobe, new from 02/02/2019. Findings may be due to an infectious bronchiolitis/bronchopneumonia or subacute hypersensitivity pneumonitis. 2. Near complete resolution of previously seen subpleural consolidation and ground-glass in the lateral segment right middle lobe and lateral right lower lobe, with residual postinflammatory scarring. 3. Hepatic steatosis. 4. Large hiatal hernia. Electronically Signed   By: Lorin Picket M.D.   On: 01/31/2020 13:36     Assessment and plan- Patient is a 54 y.o. female with history of pulmonary embolism on Eliquis found to have lung nodules on CT scan and this is a Visit for follow-up of lung nodules  Previously seen subpleural consolidation and groundglass nodularity has now resolved.  There is a new focus of nodular consolidation in the right lower lobe which could be potentially infectious on CT scan.  However patient is clinically asymptomatic and does not  report any fever cough sputum production or worsening shortness of breath.  She denies any recent Covid exposures or known Covid infections.  I am therefore not starting her on any antibiotic treatment at this time.  I will follow up on these findings after 3 months and if they persist I will refer her to pulmonary at that time.  I will see her back after CT scan in January   Visit Diagnosis 1. Iron deficiency anemia, unspecified iron deficiency anemia type   2. Lung nodule      Dr. Randa Evens, MD, MPH [facility] [phone] 02/08/2020 11:43 AM

## 2020-02-13 ENCOUNTER — Other Ambulatory Visit: Payer: Self-pay | Admitting: Gerontology

## 2020-02-13 DIAGNOSIS — I10 Essential (primary) hypertension: Secondary | ICD-10-CM

## 2020-02-14 NOTE — Progress Notes (Signed)
The following Medication: Shirlean Kelly has been approved thru [employer]. Approved for 2 doses on 02/13/2020 thru .  Assistance ID: [id number]. Medication is ordered as Assistance to have on hand prior to treatment. First DOS: 02/15/2020 Next DOS: 02/22/2020 **additional doses, require Patient Financials to be for processing.

## 2020-02-15 ENCOUNTER — Inpatient Hospital Stay: Payer: Self-pay

## 2020-02-15 ENCOUNTER — Other Ambulatory Visit: Payer: Self-pay

## 2020-02-15 VITALS — BP 152/93 | HR 93 | Temp 98.7°F | Resp 20

## 2020-02-15 DIAGNOSIS — D508 Other iron deficiency anemias: Secondary | ICD-10-CM

## 2020-02-15 MED ORDER — SODIUM CHLORIDE 0.9 % IV SOLN
Freq: Once | INTRAVENOUS | Status: AC
  Filled 2020-02-15: qty 250

## 2020-02-15 MED ORDER — DIPHENHYDRAMINE HCL 25 MG PO CAPS: 50.0000 mg | ORAL_CAPSULE | Freq: Once | ORAL | Status: DC

## 2020-02-15 MED ORDER — SODIUM CHLORIDE 0.9 % IV SOLN
510.0000 mg | INTRAVENOUS | Status: DC
  Administered 2020-02-15: 510 mg via INTRAVENOUS
  Filled 2020-02-15: qty 510

## 2020-02-15 NOTE — Progress Notes (Signed)
1450- Patient tolerated Feraheme infusion well. Patient discharged to home at this time.

## 2020-02-21 ENCOUNTER — Other Ambulatory Visit: Payer: Self-pay | Admitting: Psychiatry

## 2020-02-22 ENCOUNTER — Inpatient Hospital Stay: Payer: Self-pay | Attending: Oncology

## 2020-02-22 ENCOUNTER — Other Ambulatory Visit: Payer: Self-pay

## 2020-02-22 VITALS — BP 147/90 | HR 106 | Temp 97.4°F | Resp 18

## 2020-02-22 DIAGNOSIS — D508 Other iron deficiency anemias: Secondary | ICD-10-CM

## 2020-02-22 DIAGNOSIS — D509 Iron deficiency anemia, unspecified: Secondary | ICD-10-CM | POA: Insufficient documentation

## 2020-02-22 DIAGNOSIS — Z79899 Other long term (current) drug therapy: Secondary | ICD-10-CM | POA: Insufficient documentation

## 2020-02-22 MED ORDER — DIPHENHYDRAMINE HCL 25 MG PO CAPS: 50.0000 mg | ORAL_CAPSULE | Freq: Once | ORAL | Status: DC

## 2020-02-22 MED ORDER — SODIUM CHLORIDE 0.9 % IV SOLN
Freq: Once | INTRAVENOUS | Status: AC
  Filled 2020-02-22: qty 250

## 2020-02-22 MED ORDER — SODIUM CHLORIDE 0.9 % IV SOLN
510.0000 mg | INTRAVENOUS | Status: DC
  Administered 2020-02-22: 510 mg via INTRAVENOUS
  Filled 2020-02-22: qty 510

## 2020-02-22 NOTE — Progress Notes (Signed)
Pt tolerated infusion well, no s/s of distress or reaction noted. Pt declines to stay for full 30 minute post observation. Pt educated when to seek emergency care, pt verbalizes understanding. Pt and VS stable at discharge.

## 2020-02-29 ENCOUNTER — Other Ambulatory Visit: Payer: Self-pay | Admitting: Psychiatry

## 2020-03-28 ENCOUNTER — Ambulatory Visit: Payer: Self-pay | Admitting: Gerontology

## 2020-03-28 ENCOUNTER — Encounter: Payer: Self-pay | Admitting: Gerontology

## 2020-03-28 ENCOUNTER — Other Ambulatory Visit: Payer: Self-pay

## 2020-03-28 VITALS — BP 137/90 | HR 94 | Temp 97.3°F | Resp 16 | Ht 64.0 in | Wt 176.0 lb

## 2020-03-28 DIAGNOSIS — G894 Chronic pain syndrome: Secondary | ICD-10-CM

## 2020-03-28 DIAGNOSIS — I10 Essential (primary) hypertension: Secondary | ICD-10-CM

## 2020-03-28 DIAGNOSIS — F32A Depression, unspecified: Secondary | ICD-10-CM

## 2020-03-28 DIAGNOSIS — E049 Nontoxic goiter, unspecified: Secondary | ICD-10-CM

## 2020-03-28 DIAGNOSIS — I2699 Other pulmonary embolism without acute cor pulmonale: Secondary | ICD-10-CM

## 2020-03-28 DIAGNOSIS — D508 Other iron deficiency anemias: Secondary | ICD-10-CM

## 2020-03-28 MED ORDER — APIXABAN 5 MG PO TABS: 5.0000 mg | ORAL_TABLET | Freq: Two times a day (BID) | ORAL | 2 refills | Status: DC

## 2020-03-28 NOTE — Progress Notes (Signed)
Patient: Tracey Perez Female    DOB: March 13, 1966   54 y.o.   MRN: [id number] Visit Date: 03/28/2020  Today's Provider: Langston Reusing, NP   No chief complaint on file.  Subjective:    HPI  54 Y/O Female seen for routine follow-up. She is pleasant and offers no complaints. She is still taking eliquis for a PE. Because the PE was untriggered she is going to be on anticoagulation for life. She had a repeat CT chest that showed some ground glass opacities and an unchanged lung nodule. She has a repeat scheduled in January. She has no respiratory symptoms.  Her TSH is better and her last labs in October were unremarkable. Overall she reports feeling well and taking all medications as prescribed.    Allergies  Allergen Reactions  . Morphine And Related Hives  . Nsaids Other (See Comments)    Affects platelet count  . Sulfa Antibiotics Shortness Of Breath  . Tape Other (See Comments)    bruises skin  . Verapamil Swelling  . Lisinopril Other (See Comments)    Burning throat, metallic taste  . Other Itching    Pt states she is allergic to the plastic that IV bags are made of. Reaction causes itching relieved by Benadryl.   . Acetazolamide Other (See Comments)    Acid reflux   . Seroquel [Quetiapine Fumarate] Other (See Comments)    Muscle cramps   Previous Medications   APIXABAN (ELIQUIS) 5 MG TABS TABLET    Take 1 tablet (5 mg total) by mouth 2 (two) times daily. one tab po twice a day afterwards   BLOOD PRESSURE MONITORING (BLOOD PRESSURE KIT) KIT    1 kit by Does not apply route 2 (two) times daily.   CHOLECALCIFEROL (VITAMIN D) 125 MCG (5000 UT) CAPS    Take 10,000 Units by mouth at bedtime.    DIPHENHYDRAMINE (BENADRYL) 25 MG CAPSULE    Take 50 mg by mouth 4 (four) times daily as needed for allergies or sleep. Patient taking 100 mg QHS as needed.   GABAPENTIN (NEURONTIN) 300 MG CAPSULE    Take 300 mg by mouth 3 (three) times daily.   HYDRALAZINE (APRESOLINE) 25 MG TABLET     Take 1 tablet (25 mg total) by mouth daily.   LEVOTHYROXINE (SYNTHROID) 50 MCG TABLET    Take 1 tablet (50 mcg total) by mouth daily before breakfast.   MELATONIN 10 MG TABS    Take 10 mg by mouth at bedtime.   MIRTAZAPINE (REMERON) 30 MG TABLET    Take 15 mg by mouth at bedtime.    OXYCODONE (ROXICODONE) 30 MG IMMEDIATE RELEASE TABLET    Take 30 mg by mouth 5 (five) times daily.   TIZANIDINE (ZANAFLEX) 4 MG TABLET    Take 12-16 mg by mouth at bedtime.    VENLAFAXINE XR (EFFEXOR-XR) 150 MG 24 HR CAPSULE    Take 300 mg by mouth daily.   ZOLPIDEM (AMBIEN) 10 MG TABLET    Take 10 mg by mouth at bedtime.     Review of Systems  Constitutional: Negative.   Respiratory: Negative.   Cardiovascular: Negative.   Gastrointestinal: Negative.   Endocrine: Negative for cold intolerance and heat intolerance.  Musculoskeletal: Negative.   Skin: Negative.   Neurological: Negative.   Hematological: Negative.     Social History   Tobacco Use  . Smoking status: Former Smoker    Packs/day: 1.00    Years: 25.00    Pack  years: 25.00    Types: Cigarettes    Quit date: 12/17/2011    Years since quitting: 8.2  . Smokeless tobacco: Never Used  Substance Use Topics  . Alcohol use: Never   Objective:   There were no vitals taken for this visit.  Physical Exam Vitals and nursing note reviewed.  Constitutional:      Appearance: Normal appearance.  HENT:     Nose: Nose normal.     Mouth/Throat:     Mouth: Mucous membranes are moist.  Eyes:     Extraocular Movements: Extraocular movements intact.     Conjunctiva/sclera: Conjunctivae normal.     Pupils: Pupils are equal, round, and reactive to light.  Cardiovascular:     Rate and Rhythm: Normal rate and regular rhythm.     Pulses: Normal pulses.     Heart sounds: Normal heart sounds.  Pulmonary:     Effort: Pulmonary effort is normal.     Breath sounds: Normal breath sounds.  Abdominal:     General: Abdomen is flat. Bowel sounds are normal.      Palpations: Abdomen is soft.  Musculoskeletal:        General: Normal range of motion.     Cervical back: Normal range of motion and neck supple.  Skin:    General: Skin is warm and dry.     Capillary Refill: Capillary refill takes less than 2 seconds.  Neurological:     General: No focal deficit present.     Mental Status: She is alert and oriented to person, place, and time.  Psychiatric:        Mood and Affect: Mood normal.        Assessment & Plan:  1. Bilateral pulmonary embolism (HCC) Continue Eliquis 5 mg twice a day.  Refills provided - apixaban (ELIQUIS) 5 MG TABS tablet; Take 1 tablet (5 mg total) by mouth 2 (two) times daily. one tab po twice a day afterwards  Dispense: 180 tablet; Refill: 2  2. Chronic pain syndrome Continue follow-up with pain clinic  3. Essential hypertension Blood pressures well controlled.  Continue hydralazine 25 mg daily and a low-salt diet  4. Depression, unspecified depression type Mood is stable today.  Continue current medications and follow-up with psychiatrist  5. Other iron deficiency anemia Last hemoglobin was normal.  Continue iron infusions per hematooncology.  Will obtain repeat anemia panel in 3 months. - HgB A1c; Future - Vitamin B12; Future - Folate; Future - Iron and TIBC; Future - Ferritin; Future - Lipid Profile; Future - Comp Met (CMET); Future - CBC w/Diff; Future  6. Nodular goiter Continue Synthroid and follow-up with endocrinologist.  Will obtain TSH with free T4 during next visit  - TSH; Future - T4, free; Future  Magdalene S. Tukov ANP-BC  NB: This document was prepared using Systems analyst and may include unintentional dictation errors.    [facility]

## 2020-04-30 ENCOUNTER — Ambulatory Visit
Admission: RE | Admit: 2020-04-30 | Discharge: 2020-04-30 | Disposition: A | Discharge status: Home / Self Care | Payer: Self-pay | Source: Ambulatory Visit | Attending: Oncology | Admitting: Oncology

## 2020-04-30 ENCOUNTER — Other Ambulatory Visit: Payer: Self-pay

## 2020-04-30 DIAGNOSIS — R911 Solitary pulmonary nodule: Secondary | ICD-10-CM

## 2020-05-04 ENCOUNTER — Ambulatory Visit: Payer: Self-pay

## 2020-05-07 ENCOUNTER — Encounter: Payer: Self-pay | Admitting: Oncology

## 2020-05-07 ENCOUNTER — Other Ambulatory Visit: Payer: Self-pay | Admitting: *Deleted

## 2020-05-07 ENCOUNTER — Inpatient Hospital Stay: Payer: Self-pay | Attending: Oncology | Admitting: Oncology

## 2020-05-07 DIAGNOSIS — R918 Other nonspecific abnormal finding of lung field: Secondary | ICD-10-CM

## 2020-05-07 DIAGNOSIS — Z86711 Personal history of pulmonary embolism: Secondary | ICD-10-CM

## 2020-05-07 DIAGNOSIS — I2699 Other pulmonary embolism without acute cor pulmonale: Secondary | ICD-10-CM

## 2020-05-07 NOTE — Progress Notes (Signed)
I connected with Tracey Perez on 05/07/20 at  1:00 PM EST by video enabled telemedicine visit and verified that I am speaking with the correct person using two identifiers.   I discussed the limitations, risks, security and privacy concerns of performing an evaluation and management service by telemedicine and the availability of in-person appointments. I also discussed with the patient that there may be a patient responsible charge related to this service. The patient expressed understanding and agreed to proceed.  Other persons participating in the visit and their role in the encounter:  none  Patient's location:  home Provider's location:  work  Diagnosis- History of recent pulmonary embolism and left lower extremity DVT back in 2003 History of lung nodules  Chief Complaint: Discuss CT scan results to follow-up on lung nodules  History of present illness: patient is a 55 year old female with a past medical history significant for migraine, hypertension among other medical problems. She had a prior history of left lower extremity DVT back in 2003 when she took Coumadin for a year and then stopped it. That was an unprovoked episode and there was no precipitating factors such as prolonged air road travel, infections hospitalization or surgery. Patient was doing well up until 2020 when she experienced exertional shortness of breath which lasted for a week and did not get better. She went to the ER and underwent a CT chest which showed bilateral PE without right heart strain. Left Lower extremity Doppler was also done given her symptoms of swelling and was negative for DVT. Patient was discharged on Eliquis. Patient does not have any insurance and is currently getting Eliquis through [facility]. She reports some itching since she started Eliquis.No history of any pregnancy losses. No family history of DVT or PE. She is currently not on any home oxygen  Hypercoagulable work-up  did not reveal any evidence of prothrombin gene mutation, factor V Leiden mutation.Antiphospholipid antibody syndrome test was negative. Protein C, protein S, Antithrombin III levels were normal.   Interval history: Patient currently reports doing well.  No recent diagnosis of COVID, denies any fever cough or shortness of breath.   Review of Systems  Constitutional: Negative for chills, fever, malaise/fatigue and weight loss.  HENT: Negative for congestion, ear discharge and nosebleeds.   Eyes: Negative for blurred vision.  Respiratory: Negative for cough, hemoptysis, sputum production, shortness of breath and wheezing.   Cardiovascular: Negative for chest pain, palpitations, orthopnea and claudication.  Gastrointestinal: Negative for abdominal pain, blood in stool, constipation, diarrhea, heartburn, melena, nausea and vomiting.  Genitourinary: Negative for dysuria, flank pain, frequency, hematuria and urgency.  Musculoskeletal: Negative for back pain, joint pain and myalgias.  Skin: Negative for rash.  Neurological: Negative for dizziness, tingling, focal weakness, seizures, weakness and headaches.  Endo/Heme/Allergies: Does not bruise/bleed easily.  Psychiatric/Behavioral: Negative for depression and suicidal ideas. The patient does not have insomnia.     Allergies  Allergen Reactions  . Morphine And Related Hives  . Nsaids Other (See Comments)    Affects platelet count  . Sulfa Antibiotics Shortness Of Breath  . Tape Other (See Comments)    bruises skin  . Verapamil Swelling  . Lisinopril Other (See Comments)    Burning throat, metallic taste  . Other Itching    Pt states she is allergic to the plastic that IV bags are made of. Reaction causes itching relieved by Benadryl.   . Acetazolamide Other (See Comments)    Acid reflux   . Seroquel [place]  Fumarate] Other (See Comments)    Muscle cramps    Past Medical History:  Diagnosis Date  . Anemia    Ferritin anemia    . Anxiety   . Complication of anesthesia   . Depression   . DVT (deep vein thrombosis) in pregnancy   . Headache    chronic migraines   . Hypertension   . PONV (postoperative nausea and vomiting)   . Pulmonary embolism ([place])    hx of last one 10/20 hospitalized for one day at [facility] per patient followed by Dr Janese Banks   . Sleep apnea    does not uses cpap     Past Surgical History:  Procedure Laterality Date  . ABDOMINAL HYSTERECTOMY    . CESAREAN SECTION    . laparoscopic surgery     . THYROID LOBECTOMY Right 08/08/2019   Procedure: RIGHT THYROID LOBECTOMY;  Surgeon: Armandina Gemma, MD;  Location: [facility];  Service: General;  Laterality: Right;  . TONSILLECTOMY      Social History   Socioeconomic History  . Marital status: Divorced    Spouse name: Not on file  . Number of children: 1  . Years of education: 83  . Highest education level: Not on file  Occupational History  . Not on file  Tobacco Use  . Smoking status: Former Smoker    Packs/day: 1.00    Years: 25.00    Pack years: 25.00    Types: Cigarettes    Quit date: 12/17/2011    Years since quitting: 8.3  . Smokeless tobacco: Never Used  Vaping Use  . Vaping Use: Never used  Substance and Sexual Activity  . Alcohol use: Never  . Drug use: Never  . Sexual activity: Not on file  Other Topics Concern  . Not on file  Social History Narrative  . Not on file   Social Determinants of Health   Financial Resource Strain: High Risk  . Difficulty of Paying Living Expenses: Very hard  Food Insecurity: No Food Insecurity  . Worried About Charity fundraiser in the Last Year: Never true  . Ran Out of Food in the Last Year: Never true  Transportation Needs: No Transportation Needs  . Lack of Transportation (Medical): No  . Lack of Transportation (Non-Medical): No  Physical Activity: Not on file  Stress: No Stress Concern Present  . Feeling of Stress : Not at all  Social Connections: Unknown  . Frequency of  Communication with Friends and Family: Once a week  . Frequency of Social Gatherings with Friends and Family: Not on file  . Attends Religious Services: Never  . Active Member of Clubs or Organizations: No  . Attends Archivist Meetings: Not on file  . Marital Status: Divorced  Human resources officer Violence: Not At Risk  . Fear of Current or Ex-Partner: No  . Emotionally Abused: No  . Physically Abused: No  . Sexually Abused: No    Family History  Problem Relation Age of Onset  . Non-Hodgkin's lymphoma Mother   . Hypertension Father   . Depression Father   . Alcohol abuse Father   . Migraines Father   . Multiple sclerosis Sister   . Migraines Sister   . Migraines Brother   . Thyroid disease Brother   . Migraines Brother   . Thyroid disease Brother   . Thyroid disease Brother   . Thyroid disease Brother      Current Outpatient Medications:  .  apixaban (ELIQUIS) 5  MG TABS tablet, Take 1 tablet (5 mg total) by mouth 2 (two) times daily. one tab po twice a day afterwards, Disp: 180 tablet, Rfl: 2 .  Blood Pressure Monitoring (BLOOD PRESSURE KIT) KIT, 1 kit by Does not apply route 2 (two) times daily., Disp: 1 kit, Rfl: 0 .  Cholecalciferol (VITAMIN D) 125 MCG (5000 UT) CAPS, Take 10,000 Units by mouth at bedtime. , Disp: , Rfl:  .  diphenhydrAMINE (BENADRYL) 25 mg capsule, Take 50 mg by mouth 4 (four) times daily as needed for allergies or sleep. Patient taking 100 mg QHS as needed., Disp: , Rfl:  .  gabapentin (NEURONTIN) 300 MG capsule, Take 300 mg by mouth 3 (three) times daily., Disp: , Rfl:  .  hydrALAZINE (APRESOLINE) 25 MG tablet, Take 1 tablet (25 mg total) by mouth daily., Disp: 90 tablet, Rfl: 0 .  levothyroxine (SYNTHROID) 50 MCG tablet, Take 1 tablet (50 mcg total) by mouth daily before breakfast., Disp: 90 tablet, Rfl: 1 .  Melatonin 10 MG TABS, Take 10 mg by mouth at bedtime., Disp: , Rfl:  .  mirtazapine (REMERON) 30 MG tablet, Take 15 mg by mouth at bedtime.  , Disp: , Rfl:  .  oxycodone (ROXICODONE) 30 MG immediate release tablet, Take 30 mg by mouth 5 (five) times daily., Disp: , Rfl:  .  tiZANidine (ZANAFLEX) 4 MG tablet, Take 12-16 mg by mouth at bedtime. , Disp: , Rfl:  .  venlafaxine XR (EFFEXOR-XR) 150 MG 24 hr capsule, Take 300 mg by mouth daily., Disp: , Rfl:  .  zolpidem (AMBIEN) 10 MG tablet, Take 10 mg by mouth at bedtime. , Disp: , Rfl:   CT Chest Wo Contrast  Result Date: 04/30/2020 CLINICAL DATA:  55 year old female with history of pulmonary nodule. Followup study. EXAM: CT CHEST WITHOUT CONTRAST TECHNIQUE: Multidetector CT imaging of the chest was performed following the standard protocol without IV contrast. COMPARISON:  Chest CT 01/31/2020. FINDINGS: Cardiovascular: Heart size is normal. There is no significant pericardial fluid, thickening or pericardial calcification. No atherosclerotic calcifications in the thoracic aorta or the coronary arteries. Mediastinum/Nodes: No pathologically enlarged mediastinal or hilar lymph nodes. Please note that accurate exclusion of hilar adenopathy is limited on noncontrast CT scans. Large hiatal hernia. No axillary lymphadenopathy. Lungs/Pleura: Patchy areas of peribronchovascular ground-glass attenuation scattered throughout the lungs bilaterally, significantly worsened compared to the prior study, most evident throughout the mid to upper lungs. A few scattered tiny solid pulmonary nodules are noted measuring 1-2 mm in size, stable compared to the prior study. No confluent consolidative airspace disease. No pleural effusions. Upper Abdomen: Unremarkable. Musculoskeletal: Small sclerotic lesion with narrow zone of transition in the anterior aspect of the T2 vertebral body inferiorly, stable compared to prior studies, compatible with a small bone island. There are no other aggressive appearing lytic or blastic lesions noted in the visualized portions of the skeleton. IMPRESSION: 1. Worsening patchy  peribronchovascular ground-glass attenuation in the lungs bilaterally favored to be of infectious or inflammatory etiology. 2. Tiny 1-2 mm pulmonary nodules, nonspecific, but stable compared to prior studies and statistically benign. Electronically Signed   By: Vinnie Langton M.D.   On: 04/30/2020 19:21    No images are attached to the encounter.   CMP Latest Ref Rng & Units 08/08/2019  Glucose 70 - 99 mg/dL 97  BUN 6 - 20 mg/dL 15  Creatinine 0.44 - 1.00 mg/dL 0.94  Sodium 135 - 145 mmol/L 141  Potassium 3.5 - 5.1 mmol/L  3.8  Chloride 98 - 111 mmol/L 104  CO2 22 - 32 mmol/L 25  Calcium 8.9 - 10.3 mg/dL 9.9   CBC Latest Ref Rng & Units 01/31/2020  WBC 4.0 - 10.5 K/uL 6.3  Hemoglobin 12.0 - 15.0 g/dL 12.6  Hematocrit 36.0 - 46.0 % 36.4  Platelets 150 - 400 K/uL 284     Assessment and plan: Patient is a 55 year old female with history of pulmonary embolism and DVT on Eliquis.  This is a visit to discuss CT scan results for follow-up of lung nodules  I have reviewed CT chest without contrast independently and discussed findings with the patient's.  Patient has scattered 1 to 2 mm pulmonary nodules which have remained stable overall as compared to CT scan 3 months ago.  She was noted to have groundglass attenuation in her bilateral lung which could be infectious versus inflammatory in etiology.  I have reviewed CT chest images independently and discussed findings with the patient.However she has worsening bilateral  She has known bilateral subcentimeter lung nodules which are essentially stable.  Upper lobe groundglass opacities which appear worse as compared to what they were in October 2021.  Clinically however patient is asymptomatic with no respiratory symptoms or no recent diagnosis of COVID.  I will therefore hold off on starting any antibiotics at this time.  I will reach out to pulmonary Dr. Patsey Berthold to get her input regarding the opacities at this time.  If she needs any further  intervention such as bronchoscopy versus watchful management  Patient will continue Eliquis indefinitely for history of DVT and PE which she will get through her primary care provider  Follow-up instructions: No follow-up needed with me at this time  I discussed the assessment and treatment plan with the patient. The patient was provided an opportunity to ask questions and all were answered. The patient agreed with the plan and demonstrated an understanding of the instructions.   The patient was advised to call back or seek an in-person evaluation if the symptoms worsen or if the condition fails to improve as anticipated.  Visit Diagnosis: 1. History of pulmonary embolism   2. Lung nodules     Dr. Randa Evens, MD, MPH [facility] Tel- [phone] 05/07/2020 10:37 AM

## 2020-05-07 NOTE — Progress Notes (Signed)
Pt video visit to go over results. No concerns today

## 2020-05-10 ENCOUNTER — Telehealth: Payer: Self-pay | Admitting: Pulmonary Disease

## 2020-05-10 NOTE — Telephone Encounter (Signed)
Spoke to patient and offered ov for 04/23/19/2021. Patient requested to hold off on appt at this time, as she uninsured and does she does not currently have the funds.  She would like to schedule in March.  March schedule is not out.  Will hold message to contact patient once schedule is open.

## 2020-05-10 NOTE — Telephone Encounter (Signed)
Patient is returning phone call. Patient phone number is [phone].

## 2020-05-10 NOTE — Telephone Encounter (Signed)
Lm to offer consult for 05/11/2020 per Dr. Hedwig Morton.

## 2020-05-15 NOTE — Telephone Encounter (Signed)
Lm for patient to offer appt in March.

## 2020-05-16 ENCOUNTER — Telehealth: Payer: Self-pay | Admitting: Pharmacist

## 2020-05-16 NOTE — Telephone Encounter (Signed)
05/16/2020 2:13:39 PM - Eliquis pending  -- Elmer Picker - Wednesday, May 16, 2020 2:12 PM --I have received the signed provider portion of [facility]/Eliquis--holding for patient to return her portion--mailed to patient 05/04/2020 to sign and return with income/support.

## 2020-05-17 NOTE — Telephone Encounter (Signed)
Lmx2 for patient.  Will close encounter per office protocol. Letter has been mailed to address on file.  

## 2020-05-30 ENCOUNTER — Other Ambulatory Visit: Payer: Self-pay | Admitting: Psychiatry

## 2020-05-30 ENCOUNTER — Other Ambulatory Visit: Payer: Self-pay | Admitting: "Endocrinology

## 2020-05-30 DIAGNOSIS — E890 Postprocedural hypothyroidism: Secondary | ICD-10-CM

## 2020-06-19 ENCOUNTER — Telehealth: Payer: Self-pay | Admitting: Pharmacist

## 2020-06-19 NOTE — Telephone Encounter (Signed)
06/19/2020 8:56:02 AM - Eliquis renewal faxedto [facility]  -- Elmer Picker - Tuesday, June 19, 2020 8:55 AM --Irena Reichmann renewal for Eliquis 5mg .

## 2020-06-20 ENCOUNTER — Other Ambulatory Visit: Payer: Self-pay | Admitting: Psychiatry

## 2020-06-20 ENCOUNTER — Other Ambulatory Visit: Payer: Self-pay

## 2020-06-21 ENCOUNTER — Other Ambulatory Visit: Payer: Self-pay

## 2020-06-21 DIAGNOSIS — E049 Nontoxic goiter, unspecified: Secondary | ICD-10-CM

## 2020-06-21 DIAGNOSIS — D508 Other iron deficiency anemias: Secondary | ICD-10-CM

## 2020-06-21 DIAGNOSIS — I10 Essential (primary) hypertension: Secondary | ICD-10-CM

## 2020-06-22 LAB — IRON AND TIBC
Iron Saturation: 9 % — CL (ref 15–55)
Iron: 25 ug/dL — ABNORMAL LOW (ref 27–159)
Total Iron Binding Capacity: 283 ug/dL (ref 250–450)
UIBC: 258 ug/dL (ref 131–425)

## 2020-06-22 LAB — CBC WITH DIFFERENTIAL/PLATELET
Basophils Absolute: 0 10*3/uL (ref 0.0–0.2)
Basos: 1 %
EOS (ABSOLUTE): 0.3 10*3/uL (ref 0.0–0.4)
Eos: 7 %
Hematocrit: 36.2 % (ref 34.0–46.6)
Hemoglobin: 12.3 g/dL (ref 11.1–15.9)
Immature Grans (Abs): 0 10*3/uL (ref 0.0–0.1)
Immature Granulocytes: 0 %
Lymphocytes Absolute: 0.8 10*3/uL (ref 0.7–3.1)
Lymphs: 17 %
MCH: 29.7 pg (ref 26.6–33.0)
MCHC: 34 g/dL (ref 31.5–35.7)
MCV: 87 fL (ref 79–97)
Monocytes Absolute: 0.4 10*3/uL (ref 0.1–0.9)
Monocytes: 9 %
Neutrophils Absolute: 3.2 10*3/uL (ref 1.4–7.0)
Neutrophils: 66 %
Platelets: 246 10*3/uL (ref 150–450)
RBC: 4.14 x10E6/uL (ref 3.77–5.28)
RDW: 12.6 % (ref 11.7–15.4)
WBC: 4.8 10*3/uL (ref 3.4–10.8)

## 2020-06-22 LAB — HEMOGLOBIN A1C
Est. average glucose Bld gHb Est-mCnc: 108 mg/dL
Hgb A1c MFr Bld: 5.4 % (ref 4.8–5.6)

## 2020-06-22 LAB — LIPID PANEL
Chol/HDL Ratio: 3.3 ratio (ref 0.0–4.4)
Cholesterol, Total: 200 mg/dL — ABNORMAL HIGH (ref 100–199)
HDL: 61 mg/dL (ref >39)
LDL Chol Calc (NIH): 91 mg/dL (ref 0–99)
Triglycerides: 291 mg/dL — ABNORMAL HIGH (ref 0–149)
VLDL Cholesterol Cal: 48 mg/dL — ABNORMAL HIGH (ref 5–40)

## 2020-06-22 LAB — COMPREHENSIVE METABOLIC PANEL
ALT: 17 IU/L (ref 0–32)
AST: 18 IU/L (ref 0–40)
Albumin/Globulin Ratio: 1.7 (ref 1.2–2.2)
Albumin: 4.2 g/dL (ref 3.8–4.9)
Alkaline Phosphatase: 110 IU/L (ref 44–121)
BUN/Creatinine Ratio: 11 (ref 9–23)
BUN: 10 mg/dL (ref 6–24)
Bilirubin Total: <0.2 mg/dL (ref 0.0–1.2)
CO2: 24 mmol/L (ref 20–29)
Calcium: 9.2 mg/dL (ref 8.7–10.2)
Chloride: 103 mmol/L (ref 96–106)
Creatinine, Ser: 0.87 mg/dL (ref 0.57–1.00)
Globulin, Total: 2.5 g/dL (ref 1.5–4.5)
Glucose: 93 mg/dL (ref 65–99)
Potassium: 3.8 mmol/L (ref 3.5–5.2)
Sodium: 143 mmol/L (ref 134–144)
Total Protein: 6.7 g/dL (ref 6.0–8.5)
eGFR: 79 mL/min/{1.73_m2} (ref >59)

## 2020-06-22 LAB — TSH: TSH: 3.19 u[IU]/mL (ref 0.450–4.500)

## 2020-06-22 LAB — VITAMIN B12: Vitamin B-12: 405 pg/mL (ref 232–1245)

## 2020-06-22 LAB — FERRITIN: Ferritin: 404 ng/mL — ABNORMAL HIGH (ref 15–150)

## 2020-06-22 LAB — T4, FREE: Free T4: 0.94 ng/dL (ref 0.82–1.77)

## 2020-06-22 LAB — FOLATE: Folate: 6.1 ng/mL (ref >3.0)

## 2020-06-27 ENCOUNTER — Ambulatory Visit: Payer: Self-pay | Admitting: "Endocrinology

## 2020-06-27 ENCOUNTER — Encounter: Payer: Self-pay | Admitting: "Endocrinology

## 2020-06-27 ENCOUNTER — Ambulatory Visit (INDEPENDENT_AMBULATORY_CARE_PROVIDER_SITE_OTHER): Payer: Self-pay | Admitting: "Endocrinology

## 2020-06-27 ENCOUNTER — Other Ambulatory Visit: Payer: Self-pay

## 2020-06-27 ENCOUNTER — Other Ambulatory Visit: Payer: Self-pay | Admitting: "Endocrinology

## 2020-06-27 VITALS — BP 158/84 | HR 68 | Ht 64.0 in | Wt 161.6 lb

## 2020-06-27 DIAGNOSIS — E782 Mixed hyperlipidemia: Secondary | ICD-10-CM | POA: Insufficient documentation

## 2020-06-27 DIAGNOSIS — E890 Postprocedural hypothyroidism: Secondary | ICD-10-CM

## 2020-06-27 MED ORDER — LEVOTHYROXINE SODIUM 75 MCG PO TABS: ORAL_TABLET | ORAL | 1 refills | Status: DC

## 2020-06-27 NOTE — Progress Notes (Signed)
06/27/2020, 11:13 AM      Endocrinology follow-up note   Subjective:    Patient ID: Tracey Perez, female    DOB: [birth year], PCP Langston Reusing, NP   Past Medical History:  Diagnosis Date  . Anemia    Ferritin anemia   . Anxiety   . Complication of anesthesia   . Depression   . DVT (deep vein thrombosis) in pregnancy   . Headache    chronic migraines   . Hypertension   . PONV (postoperative nausea and vomiting)   . Pulmonary embolism ([place])    hx of last one 10/20 hospitalized for one day at [facility] per patient followed by Dr Janese Banks   . Sleep apnea    does not uses cpap    Past Surgical History:  Procedure Laterality Date  . ABDOMINAL HYSTERECTOMY    . CESAREAN SECTION    . laparoscopic surgery     . THYROID LOBECTOMY Right 08/08/2019   Procedure: RIGHT THYROID LOBECTOMY;  Surgeon: Armandina Gemma, MD;  Location: [facility];  Service: General;  Laterality: Right;  . TONSILLECTOMY     Social History   Socioeconomic History  . Marital status: Divorced    Spouse name: Not on file  . Number of children: 1  . Years of education: 41  . Highest education level: Not on file  Occupational History  . Not on file  Tobacco Use  . Smoking status: Former Smoker    Packs/day: 1.00    Years: 25.00    Pack years: 25.00    Types: Cigarettes    Quit date: 12/17/2011    Years since quitting: 8.5  . Smokeless tobacco: Never Used  Vaping Use  . Vaping Use: Never used  Substance and Sexual Activity  . Alcohol use: Not Currently  . Drug use: Never  . Sexual activity: Not on file  Other Topics Concern  . Not on file  Social History Narrative  . Not on file   Social Determinants of Health   Financial Resource Strain: High Risk  . Difficulty of Paying Living Expenses: Very hard  Food Insecurity: Not on file  Transportation Needs: Not on file  Physical Activity: Not on file  Stress: Not on file  Social Connections: Unknown   . Frequency of Communication with Friends and Family: Once a week  . Frequency of Social Gatherings with Friends and Family: Not on file  . Attends Religious Services: Never  . Active Member of Clubs or Organizations: No  . Attends Archivist Meetings: Not on file  . Marital Status: Divorced   Family History  Problem Relation Age of Onset  . Non-Hodgkin's lymphoma Mother   . Hypertension Father   . Depression Father   . Alcohol abuse Father   . Migraines Father   . Multiple sclerosis Sister   . Migraines Sister   . Migraines Brother   . Thyroid disease Brother   . Migraines Brother   . Thyroid disease Brother   . Thyroid disease Brother   . Thyroid disease Brother    Outpatient Encounter Medications as of 06/27/2020  Medication Sig  . apixaban (ELIQUIS) 5 MG TABS tablet Take 1 tablet (5 mg  total) by mouth 2 (two) times daily. one tab po twice a day afterwards  . Blood Pressure Monitoring (BLOOD PRESSURE KIT) KIT 1 kit by Does not apply route 2 (two) times daily.  . Cholecalciferol (VITAMIN D) 125 MCG (5000 UT) CAPS Take 10,000 Units by mouth at bedtime.   . diphenhydrAMINE (BENADRYL) 25 mg capsule Take 50 mg by mouth 4 (four) times daily as needed for allergies or sleep. Patient taking 100 mg QHS as needed.  . gabapentin (NEURONTIN) 300 MG capsule Take 300 mg by mouth 3 (three) times daily.  . hydrALAZINE (APRESOLINE) 25 MG tablet Take 1 tablet (25 mg total) by mouth daily.  Marland Kitchen levothyroxine (SYNTHROID) 75 MCG tablet TAKE ONE TABLET BY MOUTH EVERY DAY BEFORE BREAKFAST  . Melatonin 10 MG TABS Take 10 mg by mouth at bedtime.  . mirtazapine (REMERON) 30 MG tablet Take 15 mg by mouth at bedtime.   Marland Kitchen oxycodone (ROXICODONE) 30 MG immediate release tablet Take 30 mg by mouth 5 (five) times daily.  Marland Kitchen tiZANidine (ZANAFLEX) 4 MG tablet Take 12-16 mg by mouth at bedtime.  Marland Kitchen venlafaxine XR (EFFEXOR-XR) 150 MG 24 hr capsule Take 300 mg by mouth daily.  Marland Kitchen zolpidem (AMBIEN) 10 MG  tablet Take 10 mg by mouth at bedtime.  . [DISCONTINUED] levothyroxine (SYNTHROID) 50 MCG tablet TAKE ONE TABLET BY MOUTH EVERY DAY BEFORE BREAKFAST   No facility-administered encounter medications on file as of 06/27/2020.   ALLERGIES: Allergies  Allergen Reactions  . Morphine And Related Hives  . Nsaids Other (See Comments)    Affects platelet count  . Sulfa Antibiotics Shortness Of Breath  . Tape Other (See Comments)    bruises skin  . Verapamil Swelling  . Lisinopril Other (See Comments)    Burning throat, metallic taste  . Other Itching    Pt states she is allergic to the plastic that IV bags are made of. Reaction causes itching relieved by Benadryl.   . Acetazolamide Other (See Comments)    Acid reflux   . Seroquel [Quetiapine Fumarate] Other (See Comments)    Muscle cramps    VACCINATION STATUS: Immunization History  Administered Date(s) Administered  . Influenza,inj,Quad PF,6+ Mos 02/16/2019  . Influenza-Unspecified 01/25/2012, 01/13/2013, 01/05/2014, 01/29/2015, 02/07/2016  . Tdap 12/09/2010    HPI Tracey Perez is 55 y.o. female who is being seen in follow-up after she underwent right hemithyroidectomy for abnormal thyroid biopsies.   PCP:  Langston Reusing, NP.   She underwent hemithyroidectomy on August 08, 2019.  Her surgical sample was negative for malignancy.  She was for postsurgical hypothyroidism during her last visit.  She continues to tolerate levothyroxine 50 mcg p.o. daily before breakfast.  Her previsit thyroid function tests are consistent with improvement.    Patient denies any dysphagia, shortness of breath, no voice change.  She has lost 10 more pounds since last time.  She denies palpitations, tremor, heat/cold intolerance.    She has some family history of thyroid dysfunction, however no family history of thyroid malignancy.  She is a former smoker quit in 2013. Her recent thyroid function tests were within normal limits.  Review of  Systems Limited as above.  Objective:    BP (!) 158/84   Pulse 68   Ht $R'5\' 4"'Dk$  (1.626 m)   Wt 161 lb 9.6 oz (73.3 kg)   BMI 27.74 kg/m   Wt Readings from Last 3 Encounters:  06/27/20 161 lb 9.6 oz (73.3 kg)  03/28/20 176 lb (  79.8 kg)  02/03/20 173 lb 3.2 oz (78.6 kg)    Physical Exam   Physical Exam- Limited  Constitutional:  Body mass index is 27.74 kg/m. , not in acute distress, normal state of mind Eyes:  EOMI, no exophthalmos Neck: Supple Thyroid: Healing surgical scar on anterior lower neck.   Respiratory: Adequate breathing efforts   CMP     Component Value Date/Time   NA 143 06/21/2020 1823   K 3.8 06/21/2020 1823   CL 103 06/21/2020 1823   CO2 24 06/21/2020 1823   GLUCOSE 93 06/21/2020 1823   GLUCOSE 97 08/08/2019 0811   BUN 10 06/21/2020 1823   CREATININE 0.87 06/21/2020 1823   CALCIUM 9.2 06/21/2020 1823   PROT 6.7 06/21/2020 1823   ALBUMIN 4.2 06/21/2020 1823   AST 18 06/21/2020 1823   ALT 17 06/21/2020 1823   ALKPHOS 110 06/21/2020 1823   BILITOT <0.2 06/21/2020 1823   GFRNONAA >60 08/08/2019 0811   GFRAA >60 08/08/2019 0811     Diabetic Labs (most recent): Lab Results  Component Value Date   HGBA1C 5.4 06/21/2020   HGBA1C 5.6 06/15/2019   HGBA1C 5.8 (H) 02/16/2019     Lab Results  Component Value Date   TSH 3.190 06/21/2020   TSH 4.800 (H) 12/28/2019   TSH 7.240 (H) 09/14/2019   TSH 1.040 02/16/2019   FREET4 0.94 06/21/2020   FREET4 0.92 12/28/2019     Assessment & Plan:   1.  Postsurgical hypothyroidism 2.  Status post right hemithyroidectomy Her surgical sample was negative for malignancy. Her previsit thyroid function tests are such that she would benefit from slight increase in her levothyroxine dose.  I discussed and increase her levothyroxine to 75 mcg p.o. daily before breakfast.    - We discussed about the correct intake of her thyroid hormone, on empty stomach at fasting, with water, separated by at least 30 minutes from  breakfast and other medications,  and separated by more than 4 hours from calcium, iron, multivitamins, acid reflux medications (PPIs). -Patient is made aware of the fact that thyroid hormone replacement is needed for life, dose to be adjusted by periodic monitoring of thyroid function tests.      She had only right hemithyroidectomy, will need a follow-up thyroid ultrasound in her neck in 1 to 2 years.  3.  Hyperlipidemia -Her lipid panel is consistent with hypertriglyceridemia.  She would like to defer treatment for now.  Adjustment on her levothyroxine may help.  She will need repeat fasting lipid panel along with her next labs.  - she is advised to maintain close follow up with [place], Chioma E, NP for primary care needs.     - Time spent on this patient care encounter:  30 minutes of which 50% was spent in  counseling and the rest reviewing  her current and  previous labs / studies and medications  doses and developing a plan for long term care, and documenting this care. Tracey Perez  participated in the discussions, expressed understanding, and voiced agreement with the above plans.  All questions were answered to her satisfaction. she is encouraged to contact clinic should she have any questions or concerns prior to her return visit.   Follow up plan: Return in about 6 months (around 12/28/2020) for F/U with Pre-visit Labs.   Glade Lloyd, MD [facility] [address] [place], [place] [address] Phone: [phone]  Fax: [phone]     06/27/2020,  11:13 AM  This note was partially dictated with voice recognition software. Similar sounding words can be transcribed inadequately or may not  be corrected upon review.

## 2020-06-28 ENCOUNTER — Encounter: Payer: Self-pay | Admitting: Gerontology

## 2020-06-28 ENCOUNTER — Ambulatory Visit: Payer: Self-pay | Admitting: Gerontology

## 2020-06-28 ENCOUNTER — Other Ambulatory Visit: Payer: Self-pay | Admitting: Gerontology

## 2020-06-28 VITALS — BP 144/91 | HR 82 | Ht 64.0 in | Wt 163.1 lb

## 2020-06-28 DIAGNOSIS — R7989 Other specified abnormal findings of blood chemistry: Secondary | ICD-10-CM

## 2020-06-28 DIAGNOSIS — M2540 Effusion, unspecified joint: Secondary | ICD-10-CM | POA: Insufficient documentation

## 2020-06-28 DIAGNOSIS — I10 Essential (primary) hypertension: Secondary | ICD-10-CM

## 2020-06-28 MED ORDER — HYDRALAZINE HCL 25 MG PO TABS: 25.0000 mg | ORAL_TABLET | Freq: Every day | ORAL | 0 refills | Status: DC

## 2020-06-28 NOTE — Progress Notes (Signed)
Established Patient Office Visit  Subjective:  Patient ID: Tracey Perez, female    DOB: December 23, 1965  Age: 55 y.o. MRN: [id number]  CC:  Chief Complaint  Patient presents with  . Follow-up    Regular follow up, had labs drawn last week.    HPI Tracey Perez presents for routine follow up and lab review. Her HgbA1c, CBC, Cmet,Free T4 and TSH were within normal limits. Her iron saturation was 9%, Iron was 25 ug/dl, and Ferritin was 404 ng/ml. She was not on any iron supplement, but has a history of ferritin anemia. She was seen by Endocrinologist Dr Tracey Perez on 06/27/20 for post surgical hypothyroidism and s/p right hemithyroidectomy and her Levothyroxine was increased to 75 mcg daily. She denies any symptoms and adheres to her medication regimen. Currently, she also c/o joint swelling and constant non radiating dull pain to fingers and toes that started few months ago. She states that pain intensity increases to 5/10 and she's unable to make a complete fist because of the swelling joints to her fingers. Overall, she states that she's doing well and offers no further complaint.  Past Medical History:  Diagnosis Date  . Anemia    Ferritin anemia   . Anxiety   . Complication of anesthesia   . Depression   . DVT (deep vein thrombosis) in pregnancy   . Headache    chronic migraines   . Hypertension   . PONV (postoperative nausea and vomiting)   . Pulmonary embolism ([place])    hx of last one 10/20 hospitalized for one day at [facility] per patient followed by Dr Janese Banks   . Sleep apnea    does not uses cpap     Past Surgical History:  Procedure Laterality Date  . ABDOMINAL HYSTERECTOMY    . CESAREAN SECTION    . laparoscopic surgery     . THYROID LOBECTOMY Right 08/08/2019   Procedure: RIGHT THYROID LOBECTOMY;  Surgeon: Tracey Gemma, MD;  Location: [facility];  Service: General;  Laterality: Right;  . TONSILLECTOMY      Family History  Problem Relation Age of Onset  .  Non-Hodgkin's lymphoma Mother   . Hypertension Father   . Depression Father   . Alcohol abuse Father   . Migraines Father   . Multiple sclerosis Sister   . Migraines Sister   . Migraines Brother   . Thyroid disease Brother   . Migraines Brother   . Thyroid disease Brother   . Thyroid disease Brother   . Thyroid disease Brother     Social History   Socioeconomic History  . Marital status: Divorced    Spouse name: Not on file  . Number of children: 1  . Years of education: 79  . Highest education level: Not on file  Occupational History  . Not on file  Tobacco Use  . Smoking status: Former Smoker    Packs/day: 1.00    Years: 25.00    Pack years: 25.00    Types: Cigarettes    Quit date: 12/17/2011    Years since quitting: 8.5  . Smokeless tobacco: Never Used  Vaping Use  . Vaping Use: Never used  Substance and Sexual Activity  . Alcohol use: Not Currently  . Drug use: Never  . Sexual activity: Not on file  Other Topics Concern  . Not on file  Social History Narrative  . Not on file   Social Determinants of Health   Financial Resource Strain:  High Risk  . Difficulty of Paying Living Expenses: Very hard  Food Insecurity: Not on file  Transportation Needs: Not on file  Physical Activity: Not on file  Stress: Not on file  Social Connections: Unknown  . Frequency of Communication with Friends and Family: Once a week  . Frequency of Social Gatherings with Friends and Family: Not on file  . Attends Religious Services: Never  . Active Member of Clubs or Organizations: No  . Attends Archivist Meetings: Not on file  . Marital Status: Divorced  Human resources officer Violence: Not on file    Outpatient Medications Prior to Visit  Medication Sig Dispense Refill  . apixaban (ELIQUIS) 5 MG TABS tablet Take 1 tablet (5 mg total) by mouth 2 (two) times daily. one tab po twice a day afterwards 180 tablet 2  . Blood Pressure Monitoring (BLOOD PRESSURE KIT) KIT 1 kit  by Does not apply route 2 (two) times daily. 1 kit 0  . Cholecalciferol (VITAMIN D) 125 MCG (5000 UT) CAPS Take 10,000 Units by mouth at bedtime.     . diphenhydrAMINE (BENADRYL) 25 mg capsule Take 50 mg by mouth 4 (four) times daily as needed for allergies or sleep. Patient taking 100 mg QHS as needed.    . gabapentin (NEURONTIN) 300 MG capsule Take 300 mg by mouth 3 (three) times daily.    Marland Kitchen levothyroxine (SYNTHROID) 75 MCG tablet TAKE ONE TABLET BY MOUTH EVERY DAY BEFORE BREAKFAST 90 tablet 1  . Melatonin 10 MG TABS Take 10 mg by mouth at bedtime.    . mirtazapine (REMERON) 30 MG tablet Take 15 mg by mouth at bedtime.     Marland Kitchen oxycodone (ROXICODONE) 30 MG immediate release tablet Take 30 mg by mouth 5 (five) times daily.    Marland Kitchen tiZANidine (ZANAFLEX) 4 MG tablet Take 12-16 mg by mouth at bedtime.    Marland Kitchen venlafaxine XR (EFFEXOR-XR) 150 MG 24 hr capsule Take 300 mg by mouth daily.    Marland Kitchen zolpidem (AMBIEN) 10 MG tablet Take 10 mg by mouth at bedtime.    . hydrALAZINE (APRESOLINE) 25 MG tablet Take 1 tablet (25 mg total) by mouth daily. 90 tablet 0   No facility-administered medications prior to visit.    Allergies  Allergen Reactions  . Morphine And Related Hives  . Nsaids Other (See Comments)    Affects platelet count  . Sulfa Antibiotics Shortness Of Breath  . Tape Other (See Comments)    bruises skin  . Verapamil Swelling  . Lisinopril Other (See Comments)    Burning throat, metallic taste  . Other Itching    Pt states she is allergic to the plastic that IV bags are made of. Reaction causes itching relieved by Benadryl.   . Acetazolamide Other (See Comments)    Acid reflux   . Seroquel [Quetiapine Fumarate] Other (See Comments)    Muscle cramps    ROS Review of Systems  Constitutional: Negative.   Respiratory: Negative.   Cardiovascular: Negative.   Musculoskeletal: Positive for arthralgias (joint pain).  Skin: Negative.   Neurological: Negative.   Hematological: Negative.        Objective:    Physical Exam HENT:     Head: Normocephalic and atraumatic.  Cardiovascular:     Rate and Rhythm: Normal rate and regular rhythm.     Pulses: Normal pulses.     Heart sounds: Normal heart sounds.  Pulmonary:     Effort: Pulmonary effort is normal.  Breath sounds: Normal breath sounds.  Musculoskeletal:        General: Normal range of motion.  Skin:    General: Skin is warm.  Neurological:     General: No focal deficit present.     Mental Status: She is alert and oriented to person, place, and time. Mental status is at baseline.  Psychiatric:        Mood and Affect: Mood normal.        Behavior: Behavior normal.        Thought Content: Thought content normal.        Judgment: Judgment normal.     BP (!) 144/91   Pulse 82   Ht $R'5\' 4"'zV$  (1.626 m)   Wt 163 lb 1.6 oz (74 kg)   SpO2 99%   BMI 28.00 kg/m  Wt Readings from Last 3 Encounters:  06/28/20 163 lb 1.6 oz (74 kg)  06/27/20 161 lb 9.6 oz (73.3 kg)  03/28/20 176 lb (79.8 kg)     Health Maintenance Due  Topic Date Due  . Hepatitis C Screening  Never done  . COVID-19 Vaccine (1) Never done  . PAP SMEAR-Modifier  Never done  . COLONOSCOPY (Pts 45-89yrs Insurance coverage will need to be confirmed)  Never done  . MAMMOGRAM  Never done  . INFLUENZA VACCINE  11/20/2019    There are no preventive care reminders to display for this patient.  Lab Results  Component Value Date   TSH 3.190 06/21/2020   Lab Results  Component Value Date   WBC 4.8 06/21/2020   HGB 12.3 06/21/2020   HCT 36.2 06/21/2020   MCV 87 06/21/2020   PLT 246 06/21/2020   Lab Results  Component Value Date   NA 143 06/21/2020   K 3.8 06/21/2020   CO2 24 06/21/2020   GLUCOSE 93 06/21/2020   BUN 10 06/21/2020   CREATININE 0.87 06/21/2020   BILITOT <0.2 06/21/2020   ALKPHOS 110 06/21/2020   AST 18 06/21/2020   ALT 17 06/21/2020   PROT 6.7 06/21/2020   ALBUMIN 4.2 06/21/2020   CALCIUM 9.2 06/21/2020   ANIONGAP  12 08/08/2019   Lab Results  Component Value Date   CHOL 200 (H) 06/21/2020   Lab Results  Component Value Date   HDL 61 06/21/2020   Lab Results  Component Value Date   LDLCALC 91 06/21/2020   Lab Results  Component Value Date   TRIG 291 (H) 06/21/2020   Lab Results  Component Value Date   CHOLHDL 3.3 06/21/2020   Lab Results  Component Value Date   HGBA1C 5.4 06/21/2020      Assessment & Plan:   1. Essential hypertension - Her blood pressure is improving, will continue on current medication, DASH diet and exercise as tolerated. - hydrALAZINE (APRESOLINE) 25 MG tablet; Take 1 tablet (25 mg total) by mouth daily.  Dispense: 90 tablet; Refill: 0  2. Joint swelling - Will check inflammatory markers to rule in/out Rheumatoid arthritis. - C-reactive protein; Future - Rheumatoid Factor; Future - Sedimentation rate; Future  3. Elevated ferritin level - Her Ferritin level was 404 ng/dl, will recheck and if elevated will refer to Hematology. - Ferritin; Future     Follow-up: Return in about 3 weeks (around 07/19/2020), or if symptoms worsen or fail to improve.    Hawkins Seaman Jerold Coombe, NP

## 2020-07-11 ENCOUNTER — Other Ambulatory Visit: Payer: Self-pay

## 2020-07-11 DIAGNOSIS — R7989 Other specified abnormal findings of blood chemistry: Secondary | ICD-10-CM

## 2020-07-11 DIAGNOSIS — M2540 Effusion, unspecified joint: Secondary | ICD-10-CM

## 2020-07-11 NOTE — Progress Notes (Signed)
..  The following Assist/Replace Program for [employer] from [employer] has been terminated due to [employer] starting 05/22/2020.  Last DOS: 02/22/2020.

## 2020-07-12 ENCOUNTER — Other Ambulatory Visit: Payer: Self-pay | Admitting: Pulmonary Disease

## 2020-07-12 ENCOUNTER — Encounter: Payer: Self-pay | Admitting: Pulmonary Disease

## 2020-07-12 ENCOUNTER — Ambulatory Visit (INDEPENDENT_AMBULATORY_CARE_PROVIDER_SITE_OTHER): Payer: BC Managed Care – PPO | Admitting: Pulmonary Disease

## 2020-07-12 VITALS — BP 140/82 | HR 93 | Temp 97.0°F | Ht 64.0 in | Wt 164.4 lb

## 2020-07-12 DIAGNOSIS — D500 Iron deficiency anemia secondary to blood loss (chronic): Secondary | ICD-10-CM

## 2020-07-12 DIAGNOSIS — J690 Pneumonitis due to inhalation of food and vomit: Secondary | ICD-10-CM

## 2020-07-12 DIAGNOSIS — K219 Gastro-esophageal reflux disease without esophagitis: Secondary | ICD-10-CM | POA: Diagnosis not present

## 2020-07-12 DIAGNOSIS — K449 Diaphragmatic hernia without obstruction or gangrene: Secondary | ICD-10-CM

## 2020-07-12 DIAGNOSIS — R011 Cardiac murmur, unspecified: Secondary | ICD-10-CM

## 2020-07-12 DIAGNOSIS — R7989 Other specified abnormal findings of blood chemistry: Secondary | ICD-10-CM

## 2020-07-12 LAB — FERRITIN: Ferritin: 446 ng/mL — ABNORMAL HIGH (ref 15–150)

## 2020-07-12 LAB — RHEUMATOID FACTOR: Rheumatoid fact SerPl-aCnc: 10.8 IU/mL (ref <14.0)

## 2020-07-12 LAB — SEDIMENTATION RATE: Sed Rate: 11 mm/hr (ref 0–40)

## 2020-07-12 LAB — C-REACTIVE PROTEIN: CRP: 3 mg/L (ref 0–10)

## 2020-07-12 MED ORDER — OMEPRAZOLE 40 MG PO CPDR: 40.0000 mg | DELAYED_RELEASE_CAPSULE | Freq: Every day | ORAL | 3 refills | Status: DC

## 2020-07-12 NOTE — Progress Notes (Signed)
Subjective:    Patient ID: Tracey Perez, female    DOB: June 04, 1965, 55 y.o.   MRN: [id number]  HPI Patient is a 55 year old former smoker (25-pack-year history) who presents for evaluation of abnormal findings on CT scan of the chest.  She is kindly referred by Dr. Randa Evens.  Her primary care practitioner is Chioma Ilobachie,NP.  The patient has a prior history of left lower extremity DVT in 2003 and required Coumadin for a year but then this was stopped.  This was an unprovoked episode and there was no precipitating factor noted at that time such as prolonged travel, infection, surgery or hospitalization.  She did well until October of 2020 short of breath.  She had this symptom for approximately a week and eventually went to the emergency room due to the severity of the symptom.  On evaluation she was noted to have bilateral PE without right heart strain.  She has been on Eliquis since.  As this is her second event she will now need life long anticoagulation.  Her thrombophilia work-up has been negative.  She had recent rheumatologic serologic tests that were negative.  She has had issues with chronic anemia and low iron.  Ferritins have been elevated related to the iron.  Does get frequent hand joint swelling.  She has not had any cough, no sputum production.  She does get gastroesophageal reflux particularly at nighttime and when supine.  She has not had any shortness of breath recently.  No chest pain.  She does note that her gastroesophageal reflux symptoms are becoming worse.  Patient has had regular CT scans of the chest pain nodule.  The most recent CT scan of the chest was performed on 30 April 2020.  She is noted to have worsening patchy peribronchial vascular groundglass attenuation in the lungs with tiny 1.2 mm pulmonary nodules are nonspecific but stable when compared to prior studies and likely benign.  I have reviewed the films independently and the findings are consistent with  chronic silent aspiration with aspiration bronchiolitis.   Review of Systems A 10 point review of systems was performed and it is as noted above otherwise negative.  Past Medical History:  Diagnosis Date  . Anemia    Ferritin anemia   . Anxiety   . Complication of anesthesia   . Depression   . DVT (deep vein thrombosis) in pregnancy   . Headache    chronic migraines   . Hypertension   . PONV (postoperative nausea and vomiting)   . Pulmonary embolism ([place])    hx of last one 10/20 hospitalized for one day at [facility] per patient followed by Dr Janese Banks   . Sleep apnea    does not uses cpap    Past Surgical History:  Procedure Laterality Date  . ABDOMINAL HYSTERECTOMY    . CESAREAN SECTION    . laparoscopic surgery     . THYROID LOBECTOMY Right 08/08/2019   Procedure: RIGHT THYROID LOBECTOMY;  Surgeon: Armandina Gemma, MD;  Location: [facility];  Service: General;  Laterality: Right;  . TONSILLECTOMY     Family History  Problem Relation Age of Onset  . Non-Hodgkin's lymphoma Mother   . Hypertension Father   . Depression Father   . Alcohol abuse Father   . Migraines Father   . Multiple sclerosis Sister   . Migraines Sister   . Migraines Brother   . Thyroid disease Brother   . Migraines Brother   . Thyroid disease Brother   .  Thyroid disease Brother   . Thyroid disease Brother    Social History   Tobacco Use  . Smoking status: Former Smoker    Packs/day: 1.00    Years: 25.00    Pack years: 25.00    Types: Cigarettes    Quit date: 12/17/2011    Years since quitting: 8.5  . Smokeless tobacco: Never Used  . Tobacco comment: quit in 2013   Substance Use Topics  . Alcohol use: Not Currently   Allergies  Allergen Reactions  . Morphine And Related Hives  . Nsaids Other (See Comments)    Affects platelet count  . Sulfa Antibiotics Shortness Of Breath  . Tape Other (See Comments)    bruises skin  . Verapamil Swelling  . Lisinopril Other (See Comments)    Burning throat,  metallic taste  . Other Itching    Pt states she is allergic to the plastic that IV bags are made of. Reaction causes itching relieved by Benadryl.   . Acetazolamide Other (See Comments)    Acid reflux   . Seroquel [Quetiapine Fumarate] Other (See Comments)    Muscle cramps   Current Meds  Medication Sig  . Blood Pressure Monitoring (BLOOD PRESSURE KIT) KIT 1 kit by Does not apply route 2 (two) times daily.  . Cholecalciferol (VITAMIN D) 125 MCG (5000 UT) CAPS Take 10,000 Units by mouth at bedtime.   . diphenhydrAMINE (BENADRYL) 25 mg capsule Take 50 mg by mouth 4 (four) times daily as needed for allergies or sleep. Patient taking 100 mg QHS as needed.  . gabapentin (NEURONTIN) 400 MG capsule   . hydrALAZINE (APRESOLINE) 25 MG tablet Take 1 tablet (25 mg total) by mouth daily.  Marland Kitchen levothyroxine (SYNTHROID) 75 MCG tablet TAKE ONE TABLET BY MOUTH EVERY DAY BEFORE BREAKFAST  . Melatonin 10 MG TABS Take 10 mg by mouth at bedtime.  . mirtazapine (REMERON) 30 MG tablet Take 15 mg by mouth at bedtime.   Marland Kitchen oxycodone (ROXICODONE) 30 MG immediate release tablet Take 30 mg by mouth 5 (five) times daily.  Marland Kitchen tiZANidine (ZANAFLEX) 4 MG tablet Take 12-16 mg by mouth at bedtime.  Marland Kitchen venlafaxine XR (EFFEXOR-XR) 150 MG 24 hr capsule Take 300 mg by mouth daily.  Marland Kitchen zolpidem (AMBIEN) 10 MG tablet Take 10 mg by mouth at bedtime.   Immunization History  Administered Date(s) Administered  . Influenza,inj,Quad PF,6+ Mos 02/16/2019  . Influenza-Unspecified 01/25/2012, 01/13/2013, 01/05/2014, 01/29/2015, 02/07/2016  . Tdap 12/09/2010       Objective:   Physical Exam BP 140/82 (BP Location: Left Arm, Patient Position: Sitting, Cuff Size: Normal)   Pulse 93   Temp (!) 97 F (36.1 C) (Temporal)   Ht _0  (1.626 m)   Wt 164 lb 6.4 oz (74.6 kg)   SpO2 95%   BMI 28.22 kg/m  GENERAL: Well-developed, overweight woman, no acute distress.  Fully ambulatory. HEAD: Normocephalic, atraumatic.  EYES: Pupils  equal, round, reactive to light.  No scleral icterus.  MOUTH: Nose/mouth/throat not examined due to masking requirements for COVID 19. NECK: Supple. No thyromegaly. Trachea midline. No JVD.  No adenopathy. PULMONARY: Good air entry bilaterally.  No adventitious sounds. CARDIOVASCULAR: S1 and S2. Regular rate and rhythm.  Grade 2/6 systolic ejection murmur left sternal border.  No rubs or gallops. ABDOMEN: Benign. MUSCULOSKELETAL: Mild synovitis at finger joints bilaterally, no clubbing, no edema.  NEUROLOGIC: No focal deficit, no gait disturbance, speech is fluent. SKIN: Intact,warm,dry. PSYCH: Mood and behavior normal  Above: Areas of groundglass and micronodules consistent with aspiration bronchiolitis    Above: Large hiatal hernia with intrathoracic stomach.  Assessment & Plan:     ICD-10-CM   1. Pneumonitis due to inhalation of food or vomitus (HCC)  J69.0    Patient has likely aspiration bronchiolitis We will initiate omeprazole twice a day  2. Hiatal hernia with GERD  K21.9 Ambulatory referral to Gastroenterology   K44.9    Refer patient to GI Start omeprazole Suspect she will need repair of hiatal hernia  3. Cardiac murmur  R01.1 ECHOCARDIOGRAM COMPLETE   2D echo Query hypertrophic cardiomyopathy Murmur likely due to flow and not valvular defect  4. Iron deficiency anemia due to chronic blood loss  D50.0    Suspect due to hiatal hernia with esophagitis  5. Elevated ferritin level  R79.89    I suspect that this is related to inflammation Ferritin likely acting as inflammatory marker   Orders Placed This Encounter  Procedures  . Ambulatory referral to Gastroenterology    Referral Priority:   Routine    Referral Type:   Consultation    Referral Reason:   Specialty Services Required    Number of Visits Requested:   1  . ECHOCARDIOGRAM COMPLETE    Standing Status:   Future    Standing Expiration Date:   01/12/2021    Order Specific Question:   Where should  this test be performed    Answer:   [facility]    Order Specific Question:   Perflutren DEFINITY (image enhancing agent) should be administered unless hypersensitivity or allergy exist    Answer:   Administer Perflutren    Order Specific Question:   Reason for exam-Echo    Answer:   Murmur R01.1   Meds ordered this encounter  Medications  . omeprazole (PRILOSEC) 40 MG capsule    Sig: Take 1 capsule (40 mg total) by mouth daily.    Dispense:  60 capsule    Refill:  3   Discussion:  Patient has a very large hiatal hernia and is having increasing issues with gastroesophageal reflux.  Her findings in the chest are consistent with chronic silent aspiration and aspiration bronchiolitis.  This can cause groundglass opacities as well as micronodules.  We will start omeprazole twice a day.  I recommend GI evaluation and ideally, she should have her hiatal hernia repaired.  In the meantime, she should adhere to strict antireflux measures.  This should hopefully help clear the findings in the lung.  She was noted to have a murmur today on physical exam.  Her most recent 2D echo of October 2020 is suggestive of possible early hypertrophic cardiomyopathy.  I suspect this murmur is likely due to flow rather than valvular incompetence.  Nevertheless, she should have another 2D echo to evaluate this issue.  We will see the patient in follow-up in 4 to 6 weeks time she is to contact us prior to that time should any new problems arise.   Renold Don, MD [facility]   *This note was dictated using voice recognition software/Dragon.  Despite best efforts to proofread, errors can occur which can change the meaning.  Any change was purely unintentional.

## 2020-07-12 NOTE — Patient Instructions (Signed)
We are going to schedule an echocardiogram to evaluate your heart murmur.  This may be related to either a flow issue and not necessarily due to a valve issue.  I am referring you to gastroenterology to take a look at your hiatal hernia.  I think you need to have this repaired.   I have written a prescription for omeprazole that you need to take twice a day.   Make sure that you do not eat for at least 3 hours prior to going to bed in the evening.   I think your ferritin is elevated because it is acting as an inflammatory marker.  On follow-up I will determine if you need further work-up of this.   We will see you in follow-up in 4 to 6 weeks time call sooner should any new problems arise.

## 2020-07-16 ENCOUNTER — Encounter: Payer: Self-pay | Admitting: *Deleted

## 2020-07-18 ENCOUNTER — Ambulatory Visit: Payer: BC Managed Care – PPO | Attending: Oncology

## 2020-07-18 ENCOUNTER — Other Ambulatory Visit: Payer: Self-pay

## 2020-07-18 ENCOUNTER — Ambulatory Visit
Admission: RE | Admit: 2020-07-18 | Discharge: 2020-07-18 | Disposition: A | Discharge status: Home / Self Care | Payer: BC Managed Care – PPO | Source: Ambulatory Visit | Attending: Gerontology | Admitting: Gerontology

## 2020-07-18 ENCOUNTER — Other Ambulatory Visit: Payer: Self-pay | Admitting: Gerontology

## 2020-07-18 DIAGNOSIS — Z1231 Encounter for screening mammogram for malignant neoplasm of breast: Secondary | ICD-10-CM | POA: Insufficient documentation

## 2020-07-19 ENCOUNTER — Ambulatory Visit: Payer: Self-pay | Admitting: Gerontology

## 2020-07-19 ENCOUNTER — Other Ambulatory Visit: Payer: Self-pay | Admitting: Gerontology

## 2020-07-19 VITALS — BP 126/78 | HR 89 | Temp 97.9°F | Resp 16 | Wt 164.5 lb

## 2020-07-19 DIAGNOSIS — I10 Essential (primary) hypertension: Secondary | ICD-10-CM

## 2020-07-19 DIAGNOSIS — R899 Unspecified abnormal finding in specimens from other organs, systems and tissues: Secondary | ICD-10-CM | POA: Insufficient documentation

## 2020-07-19 DIAGNOSIS — I2699 Other pulmonary embolism without acute cor pulmonale: Secondary | ICD-10-CM

## 2020-07-19 DIAGNOSIS — E890 Postprocedural hypothyroidism: Secondary | ICD-10-CM

## 2020-07-19 MED ORDER — HYDRALAZINE HCL 25 MG PO TABS
25.0000 mg | ORAL_TABLET | Freq: Every day | ORAL | 1 refills | Status: DC
Start: 2020-07-19 — End: 2020-07-19

## 2020-07-19 NOTE — Patient Instructions (Signed)

## 2020-07-19 NOTE — Progress Notes (Signed)
Established Patient Office Visit  Subjective:  Patient ID: Tracey Perez, female    DOB: 11-12-1965  Age: 55 y.o. MRN: [id number]  CC: Lab review  HPI Tracey Perez 54/y/o female who has history of Anemia, Anxiety, Depression who presents for lab review. Her Ferritin done on 07/11/20 increased from 404 ng/ml to 446 ng/ml. She was seen by Pulmonologist, Dr Patsey Berthold on 07/12/20 and she referred her to Gastroenterology for evaluation of Hiatal hernia,and was started on Omeprazole bid. Per Dr Patsey Berthold, she will determine if she needs further work up with regards to her elevated Ferritin level in 4-6 weeks. She also had Mammogram done on 07/18/20 and it showed no evidence of Malignancy. She denies chest pain, palpitation, heat/cold intolerance, weight gain, constipation, shortness of breath, hematuria, hematochezia and active bleeding. Overall, she states that she's doing well and offers no further complaint.   Past Medical History:  Diagnosis Date  . Anemia    Ferritin anemia   . Anxiety   . Complication of anesthesia   . Depression   . DVT (deep vein thrombosis) in pregnancy   . Headache    chronic migraines   . Hypertension   . PONV (postoperative nausea and vomiting)   . Pulmonary embolism ([place])    hx of last one 10/20 hospitalized for one day at [facility] per patient followed by Dr Janese Banks   . Sleep apnea    does not uses cpap     Past Surgical History:  Procedure Laterality Date  . ABDOMINAL HYSTERECTOMY    . CESAREAN SECTION    . laparoscopic surgery     . THYROID LOBECTOMY Right 08/08/2019   Procedure: RIGHT THYROID LOBECTOMY;  Surgeon: Armandina Gemma, MD;  Location: [facility];  Service: General;  Laterality: Right;  . TONSILLECTOMY      Family History  Problem Relation Age of Onset  . Non-Hodgkin's lymphoma Mother   . Hypertension Father   . Depression Father   . Alcohol abuse Father   . Migraines Father   . Multiple sclerosis Sister   . Migraines Sister   . Migraines  Brother   . Thyroid disease Brother   . Migraines Brother   . Thyroid disease Brother   . Thyroid disease Brother   . Thyroid disease Brother   . Breast cancer Maternal Aunt 60    Social History   Socioeconomic History  . Marital status: Divorced    Spouse name: Not on file  . Number of children: 1  . Years of education: 72  . Highest education level: Not on file  Occupational History  . Not on file  Tobacco Use  . Smoking status: Former Smoker    Packs/day: 1.00    Years: 25.00    Pack years: 25.00    Types: Cigarettes    Quit date: 12/17/2011    Years since quitting: 8.6  . Smokeless tobacco: Never Used  . Tobacco comment: quit in 2013   Vaping Use  . Vaping Use: Never used  Substance and Sexual Activity  . Alcohol use: Not Currently  . Drug use: Never  . Sexual activity: Not on file  Other Topics Concern  . Not on file  Social History Narrative  . Not on file   Social Determinants of Health   Financial Resource Strain: High Risk  . Difficulty of Paying Living Expenses: Very hard  Food Insecurity: Not on file  Transportation Needs: Not on file  Physical Activity: Not on file  Stress: Not on file  Social Connections: Unknown  . Frequency of Communication with Friends and Family: Once a week  . Frequency of Social Gatherings with Friends and Family: Not on file  . Attends Religious Services: Never  . Active Member of Clubs or Organizations: No  . Attends Archivist Meetings: Not on file  . Marital Status: Divorced  Human resources officer Violence: Not on file    Outpatient Medications Prior to Visit  Medication Sig Dispense Refill  . Cholecalciferol (VITAMIN D) 125 MCG (5000 UT) CAPS Take 10,000 Units by mouth at bedtime.     . diphenhydrAMINE (BENADRYL) 25 mg capsule Take 50 mg by mouth 4 (four) times daily as needed for allergies or sleep. Patient taking 100 mg QHS as needed.    . gabapentin (NEURONTIN) 400 MG capsule     . Melatonin 10 MG TABS Take  10 mg by mouth at bedtime.    . mirtazapine (REMERON) 30 MG tablet Take 15 mg by mouth at bedtime.     Marland Kitchen oxycodone (ROXICODONE) 30 MG immediate release tablet Take 30 mg by mouth 5 (five) times daily.    Marland Kitchen tiZANidine (ZANAFLEX) 4 MG tablet Take 12-16 mg by mouth at bedtime.    Marland Kitchen venlafaxine XR (EFFEXOR-XR) 150 MG 24 hr capsule Take 300 mg by mouth daily.    Marland Kitchen zolpidem (AMBIEN) 10 MG tablet Take 10 mg by mouth at bedtime.    . hydrALAZINE (APRESOLINE) 25 MG tablet Take 1 tablet (25 mg total) by mouth daily. 90 tablet 0  . levothyroxine (SYNTHROID) 75 MCG tablet TAKE ONE TABLET BY MOUTH EVERY DAY BEFORE BREAKFAST 90 tablet 1  . omeprazole (PRILOSEC) 40 MG capsule Take 1 capsule (40 mg total) by mouth daily. 60 capsule 3  . apixaban (ELIQUIS) 5 MG TABS tablet Take 1 tablet (5 mg total) by mouth 2 (two) times daily. one tab po twice a day afterwards 180 tablet 2  . Blood Pressure Monitoring (BLOOD PRESSURE KIT) KIT 1 kit by Does not apply route 2 (two) times daily. 1 kit 0   No facility-administered medications prior to visit.    Allergies  Allergen Reactions  . Morphine And Related Hives  . Nsaids Other (See Comments)    Affects platelet count  . Sulfa Antibiotics Shortness Of Breath  . Tape Other (See Comments)    bruises skin  . Verapamil Swelling  . Lisinopril Other (See Comments)    Burning throat, metallic taste  . Other Itching    Pt states she is allergic to the plastic that IV bags are made of. Reaction causes itching relieved by Benadryl.   . Acetazolamide Other (See Comments)    Acid reflux   . Seroquel [Quetiapine Fumarate] Other (See Comments)    Muscle cramps    ROS Review of Systems  Constitutional: Negative.   Respiratory: Negative.   Cardiovascular: Negative.   Gastrointestinal: Negative.   Endocrine: Negative.   Neurological: Negative.   Hematological: Negative.       Objective:    Physical Exam HENT:     Head: Normocephalic and atraumatic.   Cardiovascular:     Rate and Rhythm: Normal rate and regular rhythm.     Pulses: Normal pulses.     Heart sounds: Normal heart sounds.  Pulmonary:     Effort: Pulmonary effort is normal.     Breath sounds: Normal breath sounds.  Skin:    General: Skin is warm.  Neurological:     General:  No focal deficit present.     Mental Status: She is alert and oriented to person, place, and time. Mental status is at baseline.  Psychiatric:        Mood and Affect: Mood normal.        Behavior: Behavior normal.        Thought Content: Thought content normal.        Judgment: Judgment normal.     BP 126/78 (BP Location: Right Arm, Patient Position: Sitting, Cuff Size: Large)   Pulse 89   Temp 97.9 F (36.6 C)   Resp 16   Wt 164 lb 8 oz (74.6 kg)   SpO2 95%   BMI 28.24 kg/m  Wt Readings from Last 3 Encounters:  07/19/20 164 lb 8 oz (74.6 kg)  07/12/20 164 lb 6.4 oz (74.6 kg)  07/11/20 161 lb (73 kg)     Health Maintenance Due  Topic Date Due  . Hepatitis C Screening  Never done  . COVID-19 Vaccine (1) Never done  . PAP SMEAR-Modifier  Never done  . COLONOSCOPY (Pts 45-88yrs Insurance coverage will need to be confirmed)  Never done    There are no preventive care reminders to display for this patient.  Lab Results  Component Value Date   TSH 3.190 06/21/2020   Lab Results  Component Value Date   WBC 4.8 06/21/2020   HGB 12.3 06/21/2020   HCT 36.2 06/21/2020   MCV 87 06/21/2020   PLT 246 06/21/2020   Lab Results  Component Value Date   NA 143 06/21/2020   K 3.8 06/21/2020   CO2 24 06/21/2020   GLUCOSE 93 06/21/2020   BUN 10 06/21/2020   CREATININE 0.87 06/21/2020   BILITOT <0.2 06/21/2020   ALKPHOS 110 06/21/2020   AST 18 06/21/2020   ALT 17 06/21/2020   PROT 6.7 06/21/2020   ALBUMIN 4.2 06/21/2020   CALCIUM 9.2 06/21/2020   ANIONGAP 12 08/08/2019   Lab Results  Component Value Date   CHOL 200 (H) 06/21/2020   Lab Results  Component Value Date   HDL 61  06/21/2020   Lab Results  Component Value Date   LDLCALC 91 06/21/2020   Lab Results  Component Value Date   TRIG 291 (H) 06/21/2020   Lab Results  Component Value Date   CHOLHDL 3.3 06/21/2020   Lab Results  Component Value Date   HGBA1C 5.4 06/21/2020     Assessment & Plan:    1. Essential hypertension - Her blood pressure is under control , goal should be less than 140/90, she will continue on current medication, and DASH diet.  2. Abnormal laboratory test result - Dr Patsey Berthold will determine if further work is required with regards to elevated Ferritin level.     Follow-up: She has no F/U visit, that was her last visit to [facility] because she has health care insurance. [place] wishes her well with her care.    Rowynn Mcweeney Jerold Coombe, NP

## 2020-07-20 ENCOUNTER — Inpatient Hospital Stay
Admission: RE | Admit: 2020-07-20 | Discharge: 2020-07-20 | Disposition: A | Discharge status: Home / Self Care | Payer: Self-pay | Source: Ambulatory Visit | Attending: *Deleted | Admitting: *Deleted

## 2020-07-20 ENCOUNTER — Other Ambulatory Visit: Payer: Self-pay | Admitting: *Deleted

## 2020-07-20 DIAGNOSIS — Z1231 Encounter for screening mammogram for malignant neoplasm of breast: Secondary | ICD-10-CM

## 2020-07-24 ENCOUNTER — Other Ambulatory Visit: Payer: Self-pay

## 2020-07-24 MED FILL — Hydralazine HCl Tab 25 MG: ORAL | 90 days supply | Qty: 90 | Fill #0 | Status: AC

## 2020-07-24 MED FILL — Mirtazapine Tab 15 MG: ORAL | 30 days supply | Qty: 30 | Fill #0 | Status: AC

## 2020-07-24 MED FILL — Gabapentin Cap 400 MG: ORAL | 30 days supply | Qty: 120 | Fill #0 | Status: AC

## 2020-07-27 ENCOUNTER — Other Ambulatory Visit: Payer: Self-pay

## 2020-08-07 ENCOUNTER — Telehealth: Payer: Self-pay

## 2020-08-07 NOTE — Telephone Encounter (Signed)
Lvm to change 4/20 appt to virtual

## 2020-08-08 ENCOUNTER — Telehealth: Payer: BC Managed Care – PPO | Admitting: Nurse Practitioner

## 2020-08-08 ENCOUNTER — Encounter: Payer: Self-pay | Admitting: Nurse Practitioner

## 2020-08-08 DIAGNOSIS — F321 Major depressive disorder, single episode, moderate: Secondary | ICD-10-CM | POA: Diagnosis not present

## 2020-08-08 DIAGNOSIS — E890 Postprocedural hypothyroidism: Secondary | ICD-10-CM

## 2020-08-08 DIAGNOSIS — Z7689 Persons encountering health services in other specified circumstances: Secondary | ICD-10-CM

## 2020-08-08 DIAGNOSIS — Z62819 Personal history of unspecified abuse in childhood: Secondary | ICD-10-CM

## 2020-08-08 DIAGNOSIS — E042 Nontoxic multinodular goiter: Secondary | ICD-10-CM

## 2020-08-08 DIAGNOSIS — E782 Mixed hyperlipidemia: Secondary | ICD-10-CM

## 2020-08-08 DIAGNOSIS — R7303 Prediabetes: Secondary | ICD-10-CM

## 2020-08-08 DIAGNOSIS — R918 Other nonspecific abnormal finding of lung field: Secondary | ICD-10-CM

## 2020-08-08 DIAGNOSIS — R011 Cardiac murmur, unspecified: Secondary | ICD-10-CM | POA: Insufficient documentation

## 2020-08-08 DIAGNOSIS — D6869 Other thrombophilia: Secondary | ICD-10-CM

## 2020-08-08 DIAGNOSIS — Z86711 Personal history of pulmonary embolism: Secondary | ICD-10-CM

## 2020-08-08 DIAGNOSIS — D508 Other iron deficiency anemias: Secondary | ICD-10-CM

## 2020-08-08 DIAGNOSIS — R7989 Other specified abnormal findings of blood chemistry: Secondary | ICD-10-CM

## 2020-08-08 DIAGNOSIS — G894 Chronic pain syndrome: Secondary | ICD-10-CM

## 2020-08-08 DIAGNOSIS — F5104 Psychophysiologic insomnia: Secondary | ICD-10-CM

## 2020-08-08 DIAGNOSIS — I10 Essential (primary) hypertension: Secondary | ICD-10-CM

## 2020-08-08 DIAGNOSIS — Z86718 Personal history of other venous thrombosis and embolism: Secondary | ICD-10-CM

## 2020-08-08 NOTE — Assessment & Plan Note (Signed)
Chronic, ongoing with history of right lobectomy August 08, 2019.  At this time will monitor thyroid levels in office and adjust dose of Levothyroxine as needed.  Recent labs within normal ranges in March, continue current dosing.

## 2020-08-08 NOTE — Assessment & Plan Note (Signed)
Recent March 2022 LDL 91 and ASCVD 2.4%, continue to monitor labs and focus on diet changes at home.  May benefit from medication in future, will discuss with patient next visit -- dependent on family history and overall CAD risk -- has echo upcoming.

## 2020-08-08 NOTE — Assessment & Plan Note (Signed)
Chronic, ongoing with lifelong need for anticoagulation.  Continue Eliquis and send refills as needed.  Up to date on fills at this time.  Monitor CBC regularly and kidney function.

## 2020-08-08 NOTE — Assessment & Plan Note (Signed)
Noted on recent labs, followed by pulmonary at this time -- ?related to hiatal hernia.  Will continue to monitor and review GI initial consult once available.
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
## 2020-08-08 NOTE — Assessment & Plan Note (Signed)
On lifelong Eliquis due to history of bilateral PE and history of DVT.  Monitor CBC regularly and monitor for increased bruising or bleeding.  Return to hematology as needed. 
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
## 2020-08-08 NOTE — Assessment & Plan Note (Signed)
In 2005.  Chronic, ongoing with lifelong need for anticoagulation.  Continue Eliquis and send refills as needed.  Up to date on fills at this time.  Monitor CBC regularly and kidney function.

## 2020-08-08 NOTE — Patient Instructions (Signed)
Ferritin Test Why am I having this test? The ferritin test is performed to determine if you have anemia due to iron deficiency. The test provides an indication of how much iron is stored in your body. What is being tested? This test measures the level of ferritin in your blood. Ferritin helps your body make red blood cells and the protein hemoglobin. Over time, a low ferritin level will result in a low hemoglobin and red blood cell count. This can lead to symptoms of iron deficiency, such as shortness of breath. What kind of sample is taken? A blood sample is required for this test. It is usually collected by inserting a needle into a blood vessel.   How are the results reported? Your test results will be reported as values. Your health care provider will compare your results to normal ranges that were established after testing a large group of people (reference ranges). Reference ranges may vary among labs and hospitals. For this test, common reference ranges are:  Males: 12-300 ng/mL or 12-300 mcg/L (SI units).  Females: 10-150 ng/mL or 10-150 mcg/L (SI units).  Children or adolescents: ? Newborn: 25-200 ng/mL. ? Less than or equal to 36 month old: 200-600 ng/mL. ? 2-5 months old: 50-200 ng/mL. ? 6 months to 56 years old: 7-142 ng/mL. What do the results mean?  Results that are above the reference range may indicate: ? Anemia due to causes other than iron deficiency. These include alcoholism. ? Inflammatory diseases. Examples include collagen vascular disease and chronic hepatitis. ? Certain types of cancer. These include leukemia. ? Disorders that cause iron overload in the body, such as hemochromatosis or hemosiderosis.  Results that are below the reference range may indicate iron deficiency anemia. Talk with your health care provider about what your results mean. Questions to ask your health care provider Ask your health care provider, or the department that is doing the  test:  When will my results be ready?  How will I get my results?  What are my treatment options?  What other tests do I need?  What are my next steps? Summary  The ferritin test is performed to determine if you have anemia due to iron deficiency. The test provides an indication of how much iron is stored in your body.  Ferritin helps your body make red blood cells and the protein hemoglobin.  Levels of ferritin that are above or below the reference range may indicate some diseases, such as anemia, leukemia, hepatitis, or hemochromatosis.  Talk with your health care provider about what your results mean. This information is not intended to replace advice given to you by your health care provider. Make sure you discuss any questions you have with your health care provider. Document Revised: 08/18/2019 Document Reviewed: 08/18/2019 Elsevier Patient Education  [place].

## 2020-08-08 NOTE — Assessment & Plan Note (Signed)
Chronic, ongoing, followed by Dr. David Marks at [facility] in [place].  Continue this collaboration and medication as ordered by him, she is aware PCP does not perform chronic pain management and refills must come from pain management provider.   

## 2020-08-08 NOTE — Assessment & Plan Note (Signed)
Chronic, ongoing, continues on Ambien which is written for by Dr. David Marks at [facility] in [place] -- both psychiatry and pain management provider.  Continue this collaboration and current medications as prescribed by him. 

## 2020-08-08 NOTE — Assessment & Plan Note (Signed)
Noted by pulmonary, scheduled for echo tomorrow -- review results once available and refer to cardiology as needed.  Asymptomatic at this time.

## 2020-08-08 NOTE — Assessment & Plan Note (Signed)
History of, with recent levels improved.  Will continue to monitor and return to hematology as needed based on labs.

## 2020-08-08 NOTE — Assessment & Plan Note (Signed)
Chronic, ongoing, taking Hydralazine.  Recommend she monitor BP at least a few mornings a week at home and document.  DASH diet at home.  Continue current medication regimen and adjust as needed.  Labs up to date, can recheck next visit.  Return in 8 weeks.

## 2020-08-08 NOTE — Assessment & Plan Note (Signed)
Stable on recent labs with A1c trending downwards with diet changes, continue to monitor.  Recheck in 6 months.

## 2020-08-08 NOTE — Assessment & Plan Note (Addendum)
History of nodules, which were removed in April 2021.  At this time will monitor thyroid in office, recent levels stable.  Continue Levothyroxine.
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
## 2020-08-09 ENCOUNTER — Ambulatory Visit (INDEPENDENT_AMBULATORY_CARE_PROVIDER_SITE_OTHER): Payer: BC Managed Care – PPO

## 2020-08-09 ENCOUNTER — Telehealth: Payer: Self-pay

## 2020-08-09 ENCOUNTER — Other Ambulatory Visit: Payer: Self-pay

## 2020-08-09 DIAGNOSIS — R011 Cardiac murmur, unspecified: Secondary | ICD-10-CM

## 2020-08-09 LAB — ECHOCARDIOGRAM COMPLETE
AR max vel: 1.93 cm2
AV Area VTI: 2.11 cm2
AV Area mean vel: 1.74 cm2
AV Mean grad: 5 mmHg
AV Peak grad: 10.5 mmHg
Ao pk vel: 1.62 m/s
Area-P 1/2: 5.31 cm2
Calc EF: 56.2 %
S' Lateral: 2.9 cm
Single Plane A2C EF: 63.1 %
Single Plane A4C EF: 45.5 %

## 2020-08-09 NOTE — Telephone Encounter (Signed)
lvm to make this apt.  

## 2020-08-09 NOTE — Telephone Encounter (Signed)
-----   Message from Venita Lick, NP sent at 08/08/2020  4:06 PM EDT ----- 8 week follow-up need in office please

## 2020-08-10 ENCOUNTER — Telehealth: Payer: Self-pay | Admitting: Pulmonary Disease

## 2020-08-10 NOTE — Telephone Encounter (Signed)
Spoke to pt about test results. Pt had a clear understanding.

## 2020-08-13 ENCOUNTER — Other Ambulatory Visit: Payer: Self-pay

## 2020-08-13 NOTE — Telephone Encounter (Signed)
Pt scheduled for 10/02/2020

## 2020-08-20 ENCOUNTER — Other Ambulatory Visit: Payer: Self-pay

## 2020-08-27 DIAGNOSIS — G894 Chronic pain syndrome: Secondary | ICD-10-CM | POA: Diagnosis not present

## 2020-08-27 DIAGNOSIS — Z79899 Other long term (current) drug therapy: Secondary | ICD-10-CM | POA: Diagnosis not present

## 2020-08-27 DIAGNOSIS — F418 Other specified anxiety disorders: Secondary | ICD-10-CM | POA: Diagnosis not present

## 2020-08-29 ENCOUNTER — Encounter: Payer: Self-pay | Admitting: Pulmonary Disease

## 2020-08-29 ENCOUNTER — Ambulatory Visit: Payer: BC Managed Care – PPO | Admitting: Pulmonary Disease

## 2020-08-29 ENCOUNTER — Other Ambulatory Visit: Payer: Self-pay

## 2020-08-29 VITALS — BP 130/70 | HR 97 | Temp 97.8°F | Ht 64.0 in | Wt 155.0 lb

## 2020-08-29 DIAGNOSIS — K219 Gastro-esophageal reflux disease without esophagitis: Secondary | ICD-10-CM

## 2020-08-29 DIAGNOSIS — D500 Iron deficiency anemia secondary to blood loss (chronic): Secondary | ICD-10-CM | POA: Diagnosis not present

## 2020-08-29 DIAGNOSIS — J690 Pneumonitis due to inhalation of food and vomit: Secondary | ICD-10-CM | POA: Diagnosis not present

## 2020-08-29 DIAGNOSIS — K449 Diaphragmatic hernia without obstruction or gangrene: Secondary | ICD-10-CM | POA: Diagnosis not present

## 2020-08-29 NOTE — Patient Instructions (Signed)
Glad that you are doing better  Let us know if you have any other questions or concerns with regards to your breathing otherwise he can follow here as needed.
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
## 2020-08-30 ENCOUNTER — Encounter: Payer: Self-pay | Admitting: Gastroenterology

## 2020-08-30 ENCOUNTER — Other Ambulatory Visit: Payer: Self-pay

## 2020-08-30 ENCOUNTER — Ambulatory Visit: Payer: BC Managed Care – PPO | Admitting: Gastroenterology

## 2020-08-30 ENCOUNTER — Telehealth: Payer: Self-pay | Admitting: Pharmacy Technician

## 2020-08-30 VITALS — BP 124/86 | HR 93 | Ht 64.0 in | Wt 153.6 lb

## 2020-08-30 DIAGNOSIS — K449 Diaphragmatic hernia without obstruction or gangrene: Secondary | ICD-10-CM | POA: Insufficient documentation

## 2020-08-30 DIAGNOSIS — K219 Gastro-esophageal reflux disease without esophagitis: Secondary | ICD-10-CM | POA: Diagnosis not present

## 2020-08-30 NOTE — Progress Notes (Signed)
Appreciate input! 

## 2020-08-30 NOTE — Progress Notes (Signed)
Jonathon Bellows MD, MRCP(U.K) [address]  [place]  [place], [place] [address]  Main: [phone]  Fax: [phone]   Gastroenterology Consultation  Referring Provider:     Tyler Pita, MD Primary Care Physician:  Venita Lick, NP Primary Gastroenterologist:  Dr. Jonathon Bellows  Reason for Consultation:     Hiatal hernia and GERD        HPI:   Tracey Perez is a 55 y.o. y/o female referred for hiatal hernia and GERD.  The patient sees Dr. Patsey Berthold in the pulmonary clinic.  She is a former smoker with 25-pack-year history .  History of bilateral pulmonary embolism and has been treated with Eliquis.  She did mention that she had GERD symptoms at nighttime and when supine.  CT scan performed in January 2022 noted worsening patchy peribronchial vascular groundglass attenuation in the lungs and small pulmonary nodules.  This suggests she possibly has probable chronic silent aspiration with bronchiolitis.  She has been referred to see [place] for the hiatal hernia and treatment of reflux.  In March 2022 hemoglobin was 12.3 g with a normal TSH and a CMP that was also normal.  Last colonoscopy was back in 2015 and from what I can see she had a rectal hyperplastic polyp.   She has had symptoms of acid reflux in terms of regurgitation which usually occurs in the middle of the night when she is Asleep in the.  Found tasting liquid in her mouth.  Commenced on Prilosec 40 mg once a day about a month back and all her symptoms of reflux have resolved.  She has not been able to tolerate a wedge pillow in the past.  Has lost some weight intentionally.  Some dysphagia.  No lower GI symptoms.  She is on [place] codon for pain.    Past Medical History:  Diagnosis Date  . Anemia    Ferritin anemia   . Anxiety   . Complication of anesthesia   . Depression   . DVT (deep vein thrombosis) in pregnancy   . Headache    chronic migraines   . Hypertension   . PONV (postoperative nausea and  vomiting)   . Pulmonary embolism ([place])    hx of last one 10/20 hospitalized for one day at [facility] per patient followed by Dr Janese Banks   . Sleep apnea    does not uses cpap     Past Surgical History:  Procedure Laterality Date  . ABDOMINAL HYSTERECTOMY    . CESAREAN SECTION    . laparoscopic surgery     . THYROID LOBECTOMY Right 08/08/2019   Procedure: RIGHT THYROID LOBECTOMY;  Surgeon: Armandina Gemma, MD;  Location: [facility]S;  Service: General;  Laterality: Right;  . TONSILLECTOMY      Prior to Admission medications   Medication Sig Start Date End Date Taking? Authorizing Provider  apixaban (ELIQUIS) 5 MG TABS tablet Take 5 mg by mouth 2 (two) times daily.    [provider]  Blood Pressure Monitoring (BLOOD PRESSURE KIT) KIT 1 kit by Does not apply route 2 (two) times daily. 02/16/19   Iloabachie, Chioma E, NP  Cholecalciferol (VITAMIN D) 125 MCG (5000 UT) CAPS Take 10,000 Units by mouth at bedtime.     [provider]  diphenhydrAMINE (BENADRYL) 25 mg capsule Take 50 mg by mouth 4 (four) times daily as needed for allergies or sleep. Patient taking 100 mg QHS as needed.    [provider]  gabapentin (  NEURONTIN) 400 MG capsule TAKE ONE CAPSULE BY MOUTH FOUR TIMES A DAY 05/30/20 05/30/21  Timoteo Ace, MD  hydrALAZINE (APRESOLINE) 25 MG tablet TAKE ONE TABLET BY MOUTH EVERY DAY 07/19/20 02/18/21  Iloabachie, Chioma E, NP  levothyroxine (SYNTHROID) 75 MCG tablet TAKE ONE TABLET BY MOUTH EVERY DAY BEFORE BREAKFAST 06/27/20 06/27/21  Cassandria Anger, MD  Melatonin 10 MG TABS Take 10 mg by mouth at bedtime.    [provider]  mirtazapine (REMERON) 15 MG tablet TAKE ONE TABLET BY MOUTH AT BEDTIME 06/20/20 01/18/21  Timoteo Ace, MD  omeprazole (PRILOSEC) 40 MG capsule TAKE ONE CAPSULE BY MOUTH EVERY DAY 07/12/20 06/18/21  Tyler Pita, MD  oxycodone (ROXICODONE) 30 MG immediate release tablet Take 30 mg by mouth 5 (five) times daily.    [provider]  tiZANidine (ZANAFLEX) 4 MG tablet TAKE 4 TABLETS BY MOUTH AT BEDTIME 02/21/20 02/20/21  Timoteo Ace, MD  venlafaxine XR (EFFEXOR-XR) 150 MG 24 hr capsule Take 300 mg by mouth daily.    [provider]  zolpidem (AMBIEN) 10 MG tablet Take 10 mg by mouth at bedtime.    [provider]    Family History  Problem Relation Age of Onset  . Non-Hodgkin's lymphoma Mother   . Hypertension Father   . Depression Father   . Alcohol abuse Father   . Migraines Father   . Multiple sclerosis Sister   . Migraines Sister   . Migraines Brother   . Thyroid disease Brother   . Migraines Brother   . Thyroid disease Brother   . Thyroid disease Brother   . Thyroid disease Brother   . Breast cancer Maternal Aunt 17     Social History   Tobacco Use  . Smoking status: Former Smoker    Packs/day: 1.00    Years: 25.00    Pack years: 25.00    Types: Cigarettes    Quit date: 12/17/2011    Years since quitting: 8.7  . Smokeless tobacco: Never Used  . Tobacco comment: quit in 2013   Vaping Use  . Vaping Use: Never used  Substance Use Topics  . Alcohol use: Not Currently  . Drug use: Never    Allergies as of 08/30/2020 - Review Complete 08/30/2020  Allergen Reaction Noted  . Morphine and related Hives 02/02/2019  . Nsaids Other (See Comments) 02/02/2019  . Sulfa antibiotics Shortness Of Breath 02/02/2019  . Tape Other (See Comments) 02/02/2019  . Verapamil Swelling 02/02/2019  . Lisinopril Other (See Comments) 03/24/2019  . Other Itching 02/02/2019  . Acetazolamide Other (See Comments) 02/02/2019  . Seroquel [quetiapine fumarate] Other (See Comments) 02/02/2019    Review of Systems:    All systems reviewed and negative except where noted in HPI.   Physical Exam:  BP 124/86 (BP Location: Left Arm, Patient Position: Sitting, Cuff Size: Large)   Pulse 93   Ht _0  (1.626 m)   Wt 153 lb 9.6 oz (69.7 kg)   BMI 26.37 kg/m  No LMP recorded. Patient has  had a hysterectomy. Psych:  Alert and cooperative. Normal mood and affect. General:   Alert,  Well-developed, well-nourished, pleasant and cooperative in NAD Head:  Normocephalic and atraumatic. Eyes:  Sclera clear, no icterus.   Conjunctiva pink. Lungs:  Respirations even and unlabored.  Clear throughout to auscultation.   No wheezes, crackles, or rhonchi. No acute distress. Heart:  Regular rate and rhythm; no murmurs, clicks, rubs, or gallops. Abdomen:  Normal bowel sounds.  No bruits.  Soft, non-tender and non-distended without masses, hepatosplenomegaly or hernias noted.  No guarding or rebound tenderness.    Neurologic:  Alert and oriented x3;  grossly normal neurologically. Psych:  Alert and cooperative. Normal mood and affect.  Imaging Studies: ECHOCARDIOGRAM COMPLETE  Result Date: 08/09/2020    ECHOCARDIOGRAM REPORT   Patient Name:   Tracey Perez Date of Exam: 08/09/2020 Medical Rec #:  [id number]        Height:       64.0 in Accession #:    [id number]       Weight:       164.5 lb Date of Birth:  1965-12-16         BSA:          1.800 m Patient Age:    79 years         BP:           126/78 mmHg Patient Gender: F                HR:           84 bpm. Exam Location:  [facility] Procedure: 2D Echo, Cardiac Doppler and Color Doppler Indications:    R01.1 Murmur  History:        Patient has prior history of Echocardiogram examinations, most                 recent 02/03/2019. Signs/Symptoms:Murmur; Risk                 Factors:Hypertension, Dyslipidemia, Former Smoker, Sleep Apnea                 and h/o DVT and embolus.  Sonographer:    Pilar Jarvis RDMS, RVT, RDCS Referring Phys: [id number] CARMEN L GONZALEZ IMPRESSIONS  1. Left ventricular ejection fraction, by estimation, is 60 to 65%. The left ventricle has normal function. The left ventricle has no regional wall motion abnormalities. Left ventricular diastolic parameters were normal.  2. Right ventricular systolic function is normal. The right  ventricular size is normal. There is normal pulmonary artery systolic pressure. The estimated right ventricular systolic pressure is 54.6 mmHg.  3. The mitral valve is normal in structure. Mild mitral valve regurgitation. FINDINGS  Left Ventricle: Left ventricular ejection fraction, by estimation, is 60 to 65%. The left ventricle has normal function. The left ventricle has no regional wall motion abnormalities. The left ventricular internal cavity size was normal in size. There is  no left ventricular hypertrophy. Left ventricular diastolic parameters were normal. Right Ventricle: The right ventricular size is normal. No increase in right ventricular wall thickness. Right ventricular systolic function is normal. There is normal pulmonary artery systolic pressure. The tricuspid regurgitant velocity is 1.82 m/s, and  with an assumed right atrial pressure of 5 mmHg, the estimated right ventricular systolic pressure is 27.0 mmHg. Left Atrium: Left atrial size was normal in size. Right Atrium: Right atrial size was normal in size. Pericardium: There is no evidence of pericardial effusion. Mitral Valve: The mitral valve is normal in structure. Mild mitral valve regurgitation. No evidence of mitral valve stenosis. Tricuspid Valve: The tricuspid valve is normal in structure. Tricuspid valve regurgitation is mild . No evidence of tricuspid stenosis. Aortic Valve: The aortic valve is normal in structure. Aortic valve regurgitation is not visualized. No aortic stenosis is present. Aortic valve mean gradient measures 5.0 mmHg. Aortic valve peak gradient measures 10.5 mmHg. Aortic valve area, by  VTI measures 2.11 cm. Pulmonic Valve: The pulmonic valve was normal in structure. Pulmonic valve regurgitation is not visualized. No evidence of pulmonic stenosis. Aorta: The aortic root is normal in size and structure. Venous: The inferior vena cava is normal in size with greater than 50% respiratory variability, suggesting right  atrial pressure of 3 mmHg. IAS/Shunts: No atrial level shunt detected by color flow Doppler.  LEFT VENTRICLE PLAX 2D LVIDd:         4.30 cm     Diastology LVIDs:         2.90 cm     LV e' medial:    11.10 cm/s LV PW:         0.90 cm     LV E/e' medial:  8.7 LV IVS:        0.80 cm     LV e' lateral:   9.79 cm/s LVOT diam:     1.90 cm     LV E/e' lateral: 9.9 LV SV:         63 LV SV Index:   35 LVOT Area:     2.84 cm  LV Volumes (MOD) LV vol d, MOD A2C: 91.4 ml LV vol d, MOD A4C: 78.8 ml LV vol s, MOD A2C: 33.7 ml LV vol s, MOD A4C: 43.0 ml LV SV MOD A2C:     57.6 ml LV SV MOD A4C:     78.8 ml LV SV MOD BP:      49.4 ml RIGHT VENTRICLE          IVC RV Basal diam:  3.50 cm  IVC diam: 1.10 cm TAPSE (M-mode): 3.0 cm LEFT ATRIUM             Index       RIGHT ATRIUM           Index LA diam:        2.90 cm 1.61 cm/m  RA Area:     12.50 cm LA Vol (A2C):   55.3 ml 30.71 ml/m RA Volume:   26.00 ml  14.44 ml/m LA Vol (A4C):   42.5 ml 23.61 ml/m LA Biplane Vol: 48.8 ml 27.10 ml/m  AORTIC VALVE                    PULMONIC VALVE AV Area (Vmax):    1.93 cm     PV Vmax:       0.90 m/s AV Area (Vmean):   1.74 cm     PV Peak grad:  3.3 mmHg AV Area (VTI):     2.11 cm AV Vmax:           162.00 cm/s AV Vmean:          106.000 cm/s AV VTI:            0.299 m AV Peak Grad:      10.5 mmHg AV Mean Grad:      5.0 mmHg LVOT Vmax:         110.00 cm/s LVOT Vmean:        65.100 cm/s LVOT VTI:          0.223 m LVOT/AV VTI ratio: 0.75  AORTA Ao Root diam: 2.70 cm Ao Asc diam:  2.80 cm Ao Arch diam: 2.5 cm MITRAL VALVE               TRICUSPID VALVE MV Area (PHT): 5.31 cm    TR Peak grad:   13.2 mmHg  MV Decel Time: 143 msec    TR Vmax:        182.00 cm/s MV E velocity: 96.80 cm/s MV A velocity: 64.70 cm/s  SHUNTS MV E/A ratio:  1.50        Systemic VTI:  0.22 m                            Systemic Diam: 1.90 cm Ida Rogue MD Electronically signed by Ida Rogue MD Signature Date/Time: 08/09/2020/6:26:55 PM    Final     Assessment  and Plan:   Tracey Perez is a 55 y.o. y/o female has been referred for GERD and hiatal hernia.  She has a history which is very suggestive of nocturnal acid reflux symptoms in terms of regurgitation and which seems to have caused her to develop pneumonitis due to aspiration.  Her symptoms have resolved with PPI use.  She also may have a degree of gastroparesis due to effects of opioids and delayed gastric emptying.  Plan 1.  Discussed about a gastroparesis diet, provided her patient information.  This will reduce the number of acid reflux episodes.  2.  I will perform an EGD on Tuesday to evaluate the hiatal hernia, rule out any Barrett's esophagus.  If no abnormalities are seen she can proceed with correction of the hiatal hernia.  She has an appointment to see Dr. Dahlia Byes .  3.  Continue Prilosec 40 mg once a day on an empty stomach long-term till her hiatal hernia has been repaired.  4.  Suggested to elevate the head end of her bed by placing a few blocks of wood and advised to utilize gravity and prevent acid from the stomach going into her esophagus at night.  5.  Provided patient information on acid reflux   I have discussed alternative options, risks & benefits,  which include, but are not limited to, bleeding, infection, perforation,respiratory complication & drug reaction.  The patient agrees with this plan & written consent will be obtained.     Follow up as needed  Dr Jonathon Bellows MD,MRCP(U.K)

## 2020-08-30 NOTE — Telephone Encounter (Signed)
Patient has prescription drug coverage with [employer].  Patient no longer meets [facility]'s eligibility criteria.  Patient notified by letter.  [facility]   [address] [place], [place]  [id number] Aug 30, 2020    Tracey Perez [address], [place]  [address]  Dear Tracey Perez:  This is to inform you that you are no longer eligible to receive medication assistance at [facility].  The reason(s) are:    _____Your total gross monthly household income exceeds 250% of the Federal Poverty Level.   _____Tangible assets (savings, checking, stocks/bonds, pension, retirement, etc.) exceeds our limit  _____You are eligible to receive benefits from [facility], [facility] or HIV Medication            Assistance program _____You are eligible to receive benefits from a Medicare Part "D" plan __X__You have prescription insurance with [place] _____You are not an [facility] resident _____Failure to provide all requested proof of income information for 2022.    We regret that we are unable to help you at this time.  If your prescription coverage is terminated, please contact [facility], so that we may reassess your eligibility for our program.  If you have questions, we may be contacted at [phone].  Thank you,  [facility]

## 2020-08-30 NOTE — Patient Instructions (Signed)
Gastroesophageal Reflux Disease, Adult  Gastroesophageal reflux (GER) happens when acid from the stomach flows up into the tube that connects the mouth and the stomach (esophagus). Normally, food travels down the esophagus and stays in the stomach to be digested. With GER, food and stomach acid sometimes move back up into the esophagus. You may have a disease called gastroesophageal reflux disease (GERD) if the reflux:  Happens often.  Causes frequent or very bad symptoms.  Causes problems such as damage to the esophagus. When this happens, the esophagus becomes sore and swollen. Over time, GERD can make small holes (ulcers) in the lining of the esophagus. What are the causes? This condition is caused by a problem with the muscle between the esophagus and the stomach. When this muscle is weak or not normal, it does not close properly to keep food and acid from coming back up from the stomach. The muscle can be weak because of:  Tobacco use.  Pregnancy.  Having a certain type of hernia (hiatal hernia).  Alcohol use.  Certain foods and drinks, such as coffee, chocolate, onions, and peppermint. What increases the risk?  Being overweight.  Having a disease that affects your connective tissue.  Taking NSAIDs, such a ibuprofen. What are the signs or symptoms?  Heartburn.  Difficult or painful swallowing.  The feeling of having a lump in the throat.  A bitter taste in the mouth.  Bad breath.  Having a lot of saliva.  Having an upset or bloated stomach.  Burping.  Chest pain. Different conditions can cause chest pain. Make sure you see your doctor if you have chest pain.  Shortness of breath or wheezing.  A long-term cough or a cough at night.  Wearing away of the surface of teeth (tooth enamel).  Weight loss. How is this treated?  Making changes to your diet.  Taking medicine.  Having surgery. Treatment will depend on how bad your symptoms are. Follow these  instructions at home: Eating and drinking  Follow a diet as told by your doctor. You may need to avoid foods and drinks such as: ? Coffee and tea, with or without caffeine. ? Drinks that contain alcohol. ? Energy drinks and sports drinks. ? Bubbly (carbonated) drinks or sodas. ? Chocolate and cocoa. ? Peppermint and mint flavorings. ? Garlic and onions. ? Horseradish. ? Spicy and acidic foods. These include peppers, chili powder, curry powder, vinegar, hot sauces, and BBQ sauce. ? Citrus fruit juices and citrus fruits, such as oranges, lemons, and limes. ? Tomato-based foods. These include red sauce, chili, salsa, and pizza with red sauce. ? Fried and fatty foods. These include donuts, french fries, potato chips, and high-fat dressings. ? High-fat meats. These include hot dogs, rib eye steak, sausage, ham, and bacon. ? High-fat dairy items, such as whole milk, butter, and cream cheese.  Eat small meals often. Avoid eating large meals.  Avoid drinking large amounts of liquid with your meals.  Avoid eating meals during the 2-3 hours before bedtime.  Avoid lying down right after you eat.  Do not exercise right after you eat.   Lifestyle  Do not smoke or use any products that contain nicotine or tobacco. If you need help quitting, ask your doctor.  Try to lower your stress. If you need help doing this, ask your doctor.  If you are overweight, lose an amount of weight that is healthy for you. Ask your doctor about a safe weight loss goal.   General instructions  Pay attention to any changes in your symptoms.  Take over-the-counter and prescription medicines only as told by your doctor.  Do not take aspirin, ibuprofen, or other NSAIDs unless your doctor says it is okay.  Wear loose clothes. Do not wear anything tight around your waist.  Raise (elevate) the head of your bed about 6 inches (15 cm). You may need to use a wedge to do this.  Avoid bending over if this makes your  symptoms worse.  Keep all follow-up visits. Contact a doctor if:  You have new symptoms.  You lose weight and you do not know why.  You have trouble swallowing or it hurts to swallow.  You have wheezing or a cough that keeps happening.  You have a hoarse voice.  Your symptoms do not get better with treatment. Get help right away if:  You have sudden pain in your arms, neck, jaw, teeth, or back.  You suddenly feel sweaty, dizzy, or light-headed.  You have chest pain or shortness of breath.  You vomit and the vomit is green, yellow, or black, or it looks like blood or coffee grounds.  You faint.  Your poop (stool) is red, bloody, or black.  You cannot swallow, drink, or eat. These symptoms may represent a serious problem that is an emergency. Do not wait to see if the symptoms will go away. Get medical help right away. Call your local emergency services (911 in the [place] Do not drive yourself to the hospital. Summary  If a person has gastroesophageal reflux disease (GERD), food and stomach acid move back up into the esophagus and cause symptoms or problems such as damage to the esophagus.  Treatment will depend on how bad your symptoms are.  Follow a diet as told by your doctor.  Take all medicines only as told by your doctor. This information is not intended to replace advice given to you by your health care provider. Make sure you discuss any questions you have with your health care provider. Document Revised: 10/17/2019 Document Reviewed: 10/17/2019 Elsevier Patient Education  [employer].

## 2020-08-31 ENCOUNTER — Telehealth: Payer: Self-pay

## 2020-08-31 ENCOUNTER — Other Ambulatory Visit: Payer: Self-pay

## 2020-08-31 NOTE — Telephone Encounter (Signed)
Secure chat message: Astrid Divine : she is on eloquis given by you , need to do EGD on Tuesday , can you give holding instructions -Dr. Vicente Males  Dr. Vicente Males, it looks like Marnee Guarneri, NP. Rx that Eliquis for patient.-Ludwin Flahive   we will give her instructions.-Dr. Janese Banks

## 2020-08-31 NOTE — Telephone Encounter (Signed)
Patient has been informed the blood thinner request came back. Per Dr. Janese Banks patient is to stop medication 2 prior to procedure and restart that evening. Pt verbalized understanding.

## 2020-09-03 ENCOUNTER — Telehealth: Payer: Self-pay

## 2020-09-03 NOTE — Telephone Encounter (Signed)
Called to inform her to stop the Eliquis 2 days before and restart 2 days after, Per Marnee Guarneri, NP.

## 2020-09-04 ENCOUNTER — Ambulatory Visit: Payer: BC Managed Care – PPO | Admitting: Certified Registered Nurse Anesthetist

## 2020-09-04 ENCOUNTER — Encounter: Payer: Self-pay | Admitting: Gastroenterology

## 2020-09-04 ENCOUNTER — Encounter
Admission: RE | Disposition: A | Discharge status: Home / Self Care | Payer: Self-pay | Source: Home / Self Care | Attending: Gastroenterology

## 2020-09-04 ENCOUNTER — Other Ambulatory Visit: Payer: Self-pay

## 2020-09-04 ENCOUNTER — Ambulatory Visit
Admission: RE | Admit: 2020-09-04 | Discharge: 2020-09-04 | Disposition: A | Discharge status: Home / Self Care | Payer: BC Managed Care – PPO | Attending: Gastroenterology | Admitting: Gastroenterology

## 2020-09-04 DIAGNOSIS — K449 Diaphragmatic hernia without obstruction or gangrene: Secondary | ICD-10-CM | POA: Insufficient documentation

## 2020-09-04 DIAGNOSIS — Z86718 Personal history of other venous thrombosis and embolism: Secondary | ICD-10-CM | POA: Insufficient documentation

## 2020-09-04 DIAGNOSIS — Z791 Long term (current) use of non-steroidal anti-inflammatories (NSAID): Secondary | ICD-10-CM | POA: Diagnosis not present

## 2020-09-04 DIAGNOSIS — Z79890 Hormone replacement therapy (postmenopausal): Secondary | ICD-10-CM | POA: Diagnosis not present

## 2020-09-04 DIAGNOSIS — Z882 Allergy status to sulfonamides status: Secondary | ICD-10-CM | POA: Insufficient documentation

## 2020-09-04 DIAGNOSIS — Z91048 Other nonmedicinal substance allergy status: Secondary | ICD-10-CM | POA: Diagnosis not present

## 2020-09-04 DIAGNOSIS — Z820 Family history of epilepsy and other diseases of the nervous system: Secondary | ICD-10-CM | POA: Insufficient documentation

## 2020-09-04 DIAGNOSIS — Z807 Family history of other malignant neoplasms of lymphoid, hematopoietic and related tissues: Secondary | ICD-10-CM | POA: Diagnosis not present

## 2020-09-04 DIAGNOSIS — Z803 Family history of malignant neoplasm of breast: Secondary | ICD-10-CM | POA: Diagnosis not present

## 2020-09-04 DIAGNOSIS — Z8249 Family history of ischemic heart disease and other diseases of the circulatory system: Secondary | ICD-10-CM | POA: Insufficient documentation

## 2020-09-04 DIAGNOSIS — Z885 Allergy status to narcotic agent status: Secondary | ICD-10-CM | POA: Insufficient documentation

## 2020-09-04 DIAGNOSIS — Z79899 Other long term (current) drug therapy: Secondary | ICD-10-CM | POA: Insufficient documentation

## 2020-09-04 DIAGNOSIS — Z888 Allergy status to other drugs, medicaments and biological substances status: Secondary | ICD-10-CM | POA: Diagnosis not present

## 2020-09-04 DIAGNOSIS — Z8349 Family history of other endocrine, nutritional and metabolic diseases: Secondary | ICD-10-CM | POA: Diagnosis not present

## 2020-09-04 DIAGNOSIS — Z86711 Personal history of pulmonary embolism: Secondary | ICD-10-CM | POA: Insufficient documentation

## 2020-09-04 DIAGNOSIS — Z87891 Personal history of nicotine dependence: Secondary | ICD-10-CM | POA: Insufficient documentation

## 2020-09-04 DIAGNOSIS — E782 Mixed hyperlipidemia: Secondary | ICD-10-CM | POA: Diagnosis not present

## 2020-09-04 HISTORY — PX: ESOPHAGOGASTRODUODENOSCOPY (EGD) WITH PROPOFOL: SHX5813

## 2020-09-04 SURGERY — ESOPHAGOGASTRODUODENOSCOPY (EGD) WITH PROPOFOL
Anesthesia: General

## 2020-09-04 MED ORDER — LIDOCAINE HCL (PF) 2 % IJ SOLN
INTRAMUSCULAR | Status: AC
  Filled 2020-09-04: qty 2

## 2020-09-04 MED ORDER — LIDOCAINE HCL (CARDIAC) PF 100 MG/5ML IV SOSY
PREFILLED_SYRINGE | INTRAVENOUS | Status: DC | PRN
  Administered 2020-09-04: 40 mg via INTRAVENOUS

## 2020-09-04 MED ORDER — PROPOFOL 500 MG/50ML IV EMUL
INTRAVENOUS | Status: DC | PRN
  Administered 2020-09-04: 150 ug/kg/min via INTRAVENOUS

## 2020-09-04 MED ORDER — PROPOFOL 10 MG/ML IV BOLUS
INTRAVENOUS | Status: DC | PRN
  Administered 2020-09-04: 20 mg via INTRAVENOUS
  Administered 2020-09-04: 60 mg via INTRAVENOUS

## 2020-09-04 MED ORDER — PROPOFOL 500 MG/50ML IV EMUL
INTRAVENOUS | Status: AC
  Filled 2020-09-04: qty 50

## 2020-09-04 MED ORDER — SODIUM CHLORIDE 0.9 % IV SOLN: INTRAVENOUS | Status: DC

## 2020-09-04 NOTE — Anesthesia Procedure Notes (Signed)
Date/Time: 09/04/2020 8:00 AM Performed by: Johnna Acosta, CRNA Pre-anesthesia Checklist: Patient identified, Emergency Drugs available, Suction available, Patient being monitored and Timeout performed Patient Re-evaluated:Patient Re-evaluated prior to induction Oxygen Delivery Method: Nasal cannula Preoxygenation: Pre-oxygenation with 100% oxygen Induction Type: IV induction

## 2020-09-04 NOTE — Anesthesia Preprocedure Evaluation (Signed)
Anesthesia Evaluation  Patient identified by MRN, date of birth, ID band Patient awake    Reviewed: Allergy & Precautions, H&P , NPO status , Patient's Chart, lab work & pertinent test results, reviewed documented beta blocker date and time   History of Anesthesia Complications (+) PONV and history of anesthetic complications  Airway Mallampati: I  TM Distance: >3 FB Neck ROM: full    Dental  (+) Dental Advidsory Given, Caps, Teeth Intact   Pulmonary neg shortness of breath, sleep apnea , neg COPD, neg recent URI, former smoker, PE (in the last 2 years)   Pulmonary exam normal breath sounds clear to auscultation       Cardiovascular Exercise Tolerance: Good hypertension, (-) angina+ DVT  (-) Past MI and (-) Cardiac Stents Normal cardiovascular exam(-) dysrhythmias (-) Valvular Problems/Murmurs Rhythm:regular Rate:Normal     Neuro/Psych  Headaches, neg Seizures PSYCHIATRIC DISORDERS Anxiety Depression    GI/Hepatic Neg liver ROS, hiatal hernia, GERD  ,  Endo/Other  neg diabetesHypothyroidism   Renal/GU negative Renal ROS  negative genitourinary   Musculoskeletal   Abdominal   Peds  Hematology negative hematology ROS (+)   Anesthesia Other Findings Past Medical History: No date: Anemia     Comment:  Ferritin anemia  No date: Anxiety No date: Complication of anesthesia No date: Depression No date: DVT (deep vein thrombosis) in pregnancy No date: Headache     Comment:  chronic migraines  No date: Hypertension No date: PONV (postoperative nausea and vomiting) No date: Pulmonary embolism (HCC)     Comment:  hx of last one 10/20 hospitalized for one day at [facility]               per patient followed by Dr Janese Banks  No date: Sleep apnea     Comment:  does not uses cpap    Reproductive/Obstetrics negative OB ROS                             Anesthesia Physical Anesthesia Plan  ASA:  III  Anesthesia Plan: General   Post-op Pain Management:    Induction: Intravenous  PONV Risk Score and Plan: 4 or greater and TIVA and Propofol infusion  Airway Management Planned: Natural Airway and Nasal Cannula  Additional Equipment:   Intra-op Plan:   Post-operative Plan:   Informed Consent: I have reviewed the patients History and Physical, chart, labs and discussed the procedure including the risks, benefits and alternatives for the proposed anesthesia with the patient or authorized representative who has indicated his/her understanding and acceptance.     Dental Advisory Given  Plan Discussed with: Anesthesiologist, CRNA and Surgeon  Anesthesia Plan Comments:         Anesthesia Quick Evaluation

## 2020-09-04 NOTE — Transfer of Care (Signed)
Immediate Anesthesia Transfer of Care Note  Patient: Tracey Perez  Procedure(s) Performed: ESOPHAGOGASTRODUODENOSCOPY (EGD) WITH PROPOFOL (N/A )  Patient Location: PACU  Anesthesia Type:General  Level of Consciousness: awake, alert  and oriented  Airway & Oxygen Therapy: Patient Spontanous Breathing  Post-op Assessment: Report given to RN and Post -op Vital signs reviewed and stable  Post vital signs: Reviewed and stable  Last Vitals:  Vitals Value Taken Time  BP 134/77 09/04/20 0750  Temp 36.2 C 09/04/20 0750  Pulse 76 09/04/20 0755  Resp 17 09/04/20 0755  SpO2 100 % 09/04/20 0755  Vitals shown include unvalidated device data.  Last Pain:  Vitals:   09/04/20 0750  TempSrc: Temporal  PainSc:          Complications: No complications documented.

## 2020-09-04 NOTE — H&P (Signed)
Jonathon Bellows, MD [address], [place], [place], [place], [address] [address], [place], [place], [place], [address] Phone: [phone]  Fax: [phone]  Primary Care Physician:  Venita Lick, NP   Pre-Procedure History & Physical: HPI:  Tracey Perez is a 55 y.o. female is here for an endoscopy    Past Medical History:  Diagnosis Date  . Anemia    Ferritin anemia   . Anxiety   . Complication of anesthesia   . Depression   . DVT (deep vein thrombosis) in pregnancy   . Headache    chronic migraines   . Hypertension   . PONV (postoperative nausea and vomiting)   . Pulmonary embolism ([place])    hx of last one 10/20 hospitalized for one day at [facility] per patient followed by Dr Janese Banks   . Sleep apnea    does not uses cpap     Past Surgical History:  Procedure Laterality Date  . ABDOMINAL HYSTERECTOMY    . CESAREAN SECTION    . laparoscopic surgery     . THYROID LOBECTOMY Right 08/08/2019   Procedure: RIGHT THYROID LOBECTOMY;  Surgeon: Armandina Gemma, MD;  Location: [facility] ORS;  Service: General;  Laterality: Right;  . TONSILLECTOMY      Prior to Admission medications   Medication Sig Start Date End Date Taking? Authorizing Provider  Blood Pressure Monitoring (BLOOD PRESSURE KIT) KIT 1 kit by Does not apply route 2 (two) times daily. 02/16/19  Yes Iloabachie, Chioma E, NP  Cholecalciferol (VITAMIN D) 125 MCG (5000 UT) CAPS Take 10,000 Units by mouth at bedtime.    Yes [provider]  diphenhydrAMINE (BENADRYL) 25 mg capsule Take 50 mg by mouth 4 (four) times daily as needed for allergies or sleep. Patient taking 100 mg QHS as needed.   Yes [provider]  gabapentin (NEURONTIN) 400 MG capsule TAKE ONE CAPSULE BY MOUTH FOUR TIMES A DAY 05/30/20 05/30/21 Yes Timoteo Ace, MD  hydrALAZINE (APRESOLINE) 25 MG tablet TAKE ONE TABLET BY MOUTH EVERY DAY 07/19/20 02/18/21 Yes Iloabachie, Chioma E, NP  levothyroxine (SYNTHROID) 75 MCG tablet TAKE ONE  TABLET BY MOUTH EVERY DAY BEFORE BREAKFAST 06/27/20 06/27/21 Yes Nida, Marella Chimes, MD  Melatonin 10 MG TABS Take 10 mg by mouth at bedtime.   Yes [provider]  mirtazapine (REMERON) 15 MG tablet TAKE ONE TABLET BY MOUTH AT BEDTIME 06/20/20 01/18/21 Yes Timoteo Ace, MD  omeprazole (PRILOSEC) 40 MG capsule TAKE ONE CAPSULE BY MOUTH EVERY DAY 07/12/20 06/18/21 Yes Tyler Pita, MD  oxycodone (ROXICODONE) 30 MG immediate release tablet Take 30 mg by mouth 5 (five) times daily.   Yes [provider]  tiZANidine (ZANAFLEX) 4 MG tablet TAKE 4 TABLETS BY MOUTH AT BEDTIME 02/21/20 02/20/21 Yes Timoteo Ace, MD  venlafaxine XR (EFFEXOR-XR) 150 MG 24 hr capsule Take 300 mg by mouth daily.   Yes [provider]  zolpidem (AMBIEN) 10 MG tablet Take 10 mg by mouth at bedtime.   Yes [provider]  apixaban (ELIQUIS) 5 MG TABS tablet Take 5 mg by mouth 2 (two) times daily.    [provider]    Allergies as of 08/30/2020 - Review Complete 08/30/2020  Allergen Reaction Noted  . Morphine and related Hives 02/02/2019  . Nsaids Other (See Comments) 02/02/2019  . Sulfa antibiotics Shortness Of Breath 02/02/2019  . Tape Other (See Comments) 02/02/2019  . Verapamil Swelling 02/02/2019  . Lisinopril  Other (See Comments) 03/24/2019  . Other Itching 02/02/2019  . Acetazolamide Other (See Comments) 02/02/2019  . Seroquel [quetiapine fumarate] Other (See Comments) 02/02/2019    Family History  Problem Relation Age of Onset  . Non-Hodgkin's lymphoma Mother   . Hypertension Father   . Depression Father   . Alcohol abuse Father   . Migraines Father   . Multiple sclerosis Sister   . Migraines Sister   . Migraines Brother   . Thyroid disease Brother   . Migraines Brother   . Thyroid disease Brother   . Thyroid disease Brother   . Thyroid disease Brother   . Breast cancer Maternal Aunt 60    Social History   Socioeconomic History  . Marital  status: Divorced    Spouse name: Not on file  . Number of children: 1  . Years of education: 74  . Highest education level: Not on file  Occupational History  . Not on file  Tobacco Use  . Smoking status: Former Smoker    Packs/day: 1.00    Years: 25.00    Pack years: 25.00    Types: Cigarettes    Quit date: 12/17/2011    Years since quitting: 8.7  . Smokeless tobacco: Never Used  . Tobacco comment: quit in 2013   Vaping Use  . Vaping Use: Never used  Substance and Sexual Activity  . Alcohol use: Not Currently  . Drug use: Never  . Sexual activity: Not Currently  Other Topics Concern  . Not on file  Social History Narrative  . Not on file   Social Determinants of Health   Financial Resource Strain: High Risk  . Difficulty of Paying Living Expenses: Very hard  Food Insecurity: No Food Insecurity  . Worried About Charity fundraiser in the Last Year: Never true  . Ran Out of Food in the Last Year: Never true  Transportation Needs: No Transportation Needs  . Lack of Transportation (Medical): No  . Lack of Transportation (Non-Medical): No  Physical Activity: Insufficiently Active  . Days of Exercise per Week: 3 days  . Minutes of Exercise per Session: 30 min  Stress: No Stress Concern Present  . Feeling of Stress : Only a little  Social Connections: Socially Isolated  . Frequency of Communication with Friends and Family: Three times a week  . Frequency of Social Gatherings with Friends and Family: Three times a week  . Attends Religious Services: Never  . Active Member of Clubs or Organizations: No  . Attends Archivist Meetings: Never  . Marital Status: Separated  Intimate Partner Violence: Not on file    Review of Systems: See HPI, otherwise negative ROS  Physical Exam: BP 119/79   Pulse 79   Temp (!) 97.1 F (36.2 C) (Temporal)   Resp 18   Ht $R'5\' 4"'gP$  (1.626 m)   Wt 69.4 kg   SpO2 100%   BMI 26.26 kg/m  General:   Alert,  pleasant and  cooperative in NAD Head:  Normocephalic and atraumatic. Neck:  Supple; no masses or thyromegaly. Lungs:  Clear throughout to auscultation, normal respiratory effort.    Heart:  +S1, +S2, Regular rate and rhythm, No edema. Abdomen:  Soft, nontender and nondistended. Normal bowel sounds, without guarding, and without rebound.   Neurologic:  Alert and  oriented x4;  grossly normal neurologically.  Impression/Plan: Tracey Perez is here for an endoscopy  to be performed for  evaluation of hiatal hernia - pre  op     Risks, benefits, limitations, and alternatives regarding endoscopy have been reviewed with the patient.  Questions have been answered.  All parties agreeable.   Jonathon Bellows, MD  09/04/2020, 7:40 AM

## 2020-09-04 NOTE — Op Note (Signed)
[facility] Gastroenterology Patient Name: Tracey Perez Procedure Date: 09/04/2020 7:33 AM MRN: [id number] Account #: [id number] Date of Birth: 1965/05/31 Admit Type: Outpatient Age: 55 Room: [facility] ENDO ROOM 2 Gender: Female Note Status: Finalized Procedure:             Upper GI endoscopy Indications:           Preoperative assessment Providers:             Jonathon Bellows MD, MD Referring MD:          Barbaraann Faster. Ned Card (Referring MD) Medicines:             Monitored Anesthesia Care Complications:         No immediate complications. Procedure:             Pre-Anesthesia Assessment:                        - Prior to the procedure, a History and Physical was                         performed, and patient medications, allergies and                         sensitivities were reviewed. The patient's tolerance                         of previous anesthesia was reviewed.                        - The risks and benefits of the procedure and the                         sedation options and risks were discussed with the                         patient. All questions were answered and informed                         consent was obtained.                        - ASA Grade Assessment: II - A patient with mild                         systemic disease.                        After obtaining informed consent, the endoscope was                         passed under direct vision. Throughout the procedure,                         the patient's blood pressure, pulse, and oxygen                         saturations were monitored continuously. The Endoscope                         was introduced through the mouth, and advanced  to the                         third part of duodenum. The upper GI endoscopy was                         accomplished with ease. The patient tolerated the                         procedure well. Findings:      The esophagus was normal.      The examined duodenum  was normal.      A 10 cm hiatal hernia was present.      The cardia and gastric fundus were normal on retroflexion. Impression:            - Normal esophagus.                        - Normal examined duodenum.                        - 10 cm hiatal hernia.                        - No specimens collected. Recommendation:        - Discharge patient to home (with escort).                        - Resume previous diet.                        - Continue present medications.                        - Return to my office as previously scheduled. Procedure Code(s):     --- Professional ---                        [phone], Esophagogastroduodenoscopy, flexible,                         transoral; diagnostic, including collection of                         specimen(s) by brushing or washing, when performed                         (separate procedure) Diagnosis Code(s):     --- Professional ---                        K44.9, Diaphragmatic hernia without obstruction or                         gangrene                        Z01.818, Encounter for other preprocedural examination CPT copyright 2019 American Medical Association. All rights reserved. The codes documented in this report are preliminary and upon coder review may  be revised to meet current compliance requirements. Jonathon Bellows, MD Jonathon Bellows MD, MD 09/04/2020 7:50:01 AM This report has been signed electronically. Number of Addenda: 0 Note  Initiated On: 09/04/2020 7:33 AM Estimated Blood Loss:  Estimated blood loss: none.      [facility]

## 2020-09-04 NOTE — Anesthesia Postprocedure Evaluation (Signed)
Anesthesia Post Note  Patient: Tracey Perez  Procedure(s) Performed: ESOPHAGOGASTRODUODENOSCOPY (EGD) WITH PROPOFOL (N/A )  Patient location during evaluation: Endoscopy Anesthesia Type: General Level of consciousness: awake and alert Pain management: pain level controlled Vital Signs Assessment: post-procedure vital signs reviewed and stable Respiratory status: spontaneous breathing, nonlabored ventilation, respiratory function stable and patient connected to nasal cannula oxygen Cardiovascular status: blood pressure returned to baseline and stable Postop Assessment: no apparent nausea or vomiting Anesthetic complications: no   No complications documented.   Last Vitals:  Vitals:   09/04/20 0755 09/04/20 0800  BP:  138/78  Pulse: 74 81  Resp: 17 (!) 24  Temp:    SpO2: 98% 100%    Last Pain:  Vitals:   09/04/20 0750  TempSrc: Temporal  PainSc:                  Martha Clan

## 2020-09-05 ENCOUNTER — Encounter: Payer: Self-pay | Admitting: Gastroenterology

## 2020-09-06 ENCOUNTER — Other Ambulatory Visit: Payer: Self-pay

## 2020-09-06 DIAGNOSIS — K449 Diaphragmatic hernia without obstruction or gangrene: Secondary | ICD-10-CM

## 2020-09-10 ENCOUNTER — Other Ambulatory Visit: Payer: Self-pay

## 2020-09-10 ENCOUNTER — Encounter: Payer: Self-pay | Admitting: Surgery

## 2020-09-10 ENCOUNTER — Ambulatory Visit: Payer: BC Managed Care – PPO | Admitting: Surgery

## 2020-09-10 VITALS — BP 145/87 | HR 86 | Temp 98.9°F | Ht 64.0 in | Wt 161.6 lb

## 2020-09-10 DIAGNOSIS — K449 Diaphragmatic hernia without obstruction or gangrene: Secondary | ICD-10-CM | POA: Diagnosis not present

## 2020-09-10 DIAGNOSIS — R1084 Generalized abdominal pain: Secondary | ICD-10-CM | POA: Diagnosis not present

## 2020-09-10 NOTE — Patient Instructions (Addendum)
We will get you scheduled for a Barium Swallow and a CT of the abdomen and pelvis with oral contrast.   You will follow up with Dr Dahlia Byes after we have the results from these studies.   You are scheduled for a Barium Swallow at [facility] on 09/13/20. You will arrive at the [facility] by 8:30 am and will have nothing to eat or drink for 3 hour prior.   You are scheduled for a CT scan at [facility] on 09/26/20. You will arrive at the [facility] by 7:30 am and will have nothing to eat or drink for 4 hours prior. You will need to pick up a prep kit for this study.    We have spoken today about repairing your Hiatal Hernia. This will be scheduled at [facility] with Dr Dahlia Byes.  Plan to be in the hospital for 2-3 days if the minimally invasive surgery is completed without having to make a bigger incision. If the bigger incision is made, you will most likely need to be in the hospital 7-10 days. You will be on a liquid diet and need to recover for 2 weeks following your surgery prior to doing any of your normal activities. At the 2 week mark, we will see you in the office and if you are doing ok we will advance your diet and activity level as you tolerate.  Please see your Blue (Pre-care) Sheet for more information regarding your surgery.  Please call our office with any questions or concerns that you have regarding your surgery and recovery.     Laparoscopic Nissen Fundoplication Laparoscopic Nissen fundoplication is surgery to relieve heartburn and other problems caused by gastric fluids flowing up into your esophagus. The esophagus is the tube that carries food and liquid from your throat to your stomach. Normally, the muscle that sits between your stomach and your esophagus (lower esophageal sphincter or LES) keeps stomach fluids in your stomach. In some people, the LES does not work properly, and stomach fluids flow up into the esophagus. This can happen when part of the stomach bulges through the LES (hiatal  hernia). The backward flow of stomach fluids can cause a type of severe and long-standing heartburn that is called gastroesophageal reflux disease (GERD). You may need this surgery if other treatments for GERD have not helped. In a laparoscopic Nissen fundoplication, the upper part of your stomach is wrapped around the lower part of your esophagus to strengthen the LES and prevent reflux. If you have a hiatal hernia, it will also be repaired with this surgery. The procedure is done through several small incisions in your abdomen. It is performed using a thin, telescopic instrument (laparoscope) and other instruments that can pass through the scope or through other small incisions. Tell a health care provider about:  Any allergies you have.  All medicines you are taking, including vitamins, herbs, eye drops, creams, and over-the-counter medicines.  Any problems you or family members have had with anesthetic medicines.  Any blood disorders you have.  Any surgeries you have had.  Any medical conditions you have. What are the risks? Generally, this is a safe procedure. However, problems may occur, including:  Difficulty swallowing (dysphagia).  Bloating.  Nausea or vomiting.  Damage to the lung, causing a collapsed lung.  Infection or bleeding. What happens before the procedure?  Ask your health care provider about:  Changing or stopping your regular medicines. This is especially important if you are taking diabetes medicines or blood thinners.  Taking medicines such as aspirin and ibuprofen. These medicines can thin your blood. Do not take these medicines before your procedure if your health care provider asks you not to.  Follow your health care provider's instructions about eating or drinking restrictions.  Plan to have someone take you home after the procedure. What happens during the procedure?  An IV tube will be inserted into one of your veins. It will be used to give you  fluids and medicines during the procedure.  You will be given a medicine that makes you fall asleep (general anesthetic).  Your abdomen will be cleaned with a germ-killing solution (antiseptic).  The surgeon will make a small incision in your abdomen and insert a tube through the incision.  Your abdomen will be filled with a gas. This helps the surgeon to see your organs more easily and it makes more space to work.  The surgeon will insert the laparoscope through the incision. The scope has a camera that will send pictures to a monitor in the operating room.  The surgeon will make several other small incisions in your abdomen to insert the other instruments that are needed during the procedure.  Another instrument (dilator) will be passed through your mouth and down your esophagus into the upper part of your stomach. The dilator will prevent your LES from being closed too tightly during surgery.  The surgeon will pass the top portion of your stomach behind the lower part of your esophagus and wrap it all the way around. This will be stitched into place.  If you have a hiatal hernia, it will be repaired during this procedure.  All instruments will be removed, and your incisions will be closed under your skin with stitches (sutures). Skin adhesive strips may also be used.  A bandage (dressing) will be placed on your skin over the incisions. The procedure may vary among health care providers and hospitals. What happens after the procedure?  You will be moved to a recovery area.  Your blood pressure, heart rate, breathing rate, and blood oxygen level will be monitored often until the medicines you were given have worn off.  You will be given pain medicine as needed.  Your IV tube will be kept in until you are able to drink fluids. This information is not intended to replace advice given to you by your health care provider. Make sure you discuss any questions you have with your health  care provider. Document Released: 04/28/2014 Document Revised: 09/13/2015 Document Reviewed: 12/07/2013 Elsevier Interactive Patient Education  2017 [employer]   Laparoscopic Nissen Fundoplication, Care After Refer to this sheet in the next few weeks. These instructions provide you with information about caring for yourself after your procedure. Your health care provider may also give you more specific instructions. Your treatment has been planned according to current medical practices, but problems sometimes occur. Call your health care provider if you have any problems or questions after your procedure. What can I expect after the procedure? After the procedure, it is common to have:  Difficulty swallowing (dysphagia).  Excess gas (bloating). Follow these instructions at home: Medicines   Take medicines only as directed by your health care provider.  Do not drive or operate heavy machinery while taking pain medicine. Incision care   There are many different ways to close and cover an incision, including stitches (sutures), skin glue, and adhesive strips. Follow your health care provider's instructions about:  Incision care.  Bandage (dressing) changes and removal.  Incision closure removal.  Check your incision areas every day for signs of infection. Watch for:  Redness, swelling, or pain.  Fluid, blood, or pus.  Do not take baths, swim, or use a hot tub until your health care provider approves. Take showers as directed by your health care provider. Eating and drinking   Follow your health care provider's instructions about eating.  You may need to follow a liquid-only diet for 2 weeks, followed by a diet of soft foods for 2 weeks.  You should return to your usual diet gradually.  Drink enough fluid to keep your urine clear or pale yellow. Activity   Return to your normal activities as directed by your health care provider. Ask your health care provider what  activities are safe for you.  Avoid strenuous exercise.  Do not lift anything that is heavier than 10 lb (4.5 kg).  Ask your health care provider when you can:  Return to sexual activity.  Drive.  Go back to work. Contact a health care provider if:  You have a fever.  Your pain gets worse or is not helped by medicine.  You have frequent nausea or vomiting.  You have continued abdominal bloating.  You have an ongoing (persistent) cough.  You have redness, swelling, or pain in any incision areas.  You have fluid, blood, or pus coming from any incisions. Get help right away if:  You have trouble breathing.  You are unable to swallow.  You have persistent vomiting.  You have blood in your vomit.  You have severe abdominal pain. This information is not intended to replace advice given to you by your health care provider. Make sure you discuss any questions you have with your health care provider. Document Released: 11/29/2003 Document Revised: 09/13/2015 Document Reviewed: 12/07/2013 Elsevier Interactive Patient Education  2017 [place].   Diet After Nissen Fundoplication Surgery This diet information is for patients who have recently had Nissen fundoplication surgery to correct reflux disease or to repair various types of hernias, such as hiatal hernia and intrathoracic stomach. This diet may also be used for other gastrointestinal surgeries, such as Heller myotomy and repair of achalasia. The diet will help control diarrhea, excess gas and swallowing problems, which may occur after this type of surgery. Keeping Your Stomach from Stretching Eat small, frequent meals (six to eight per day). This will help you consume the majority of the nutrients you need without causing your stomach to feel full or distended.  Drinking large amounts of fluids with meals can stretch your stomach. You may drink fluids between meals as often as you like, but limit fluids to 1/2 cup (4  fluid ounces) with meals and one cup (8 fluid ounces) with snacks.  Sit upright while eating and stay upright for 30 minutes after each meal. Gravity can help food move through your digestive tract. Do not lie down after eating. Sit upright for 2 hours after your last meal or snack of the day.  Eat very slowly. Take your time when eating.  Take small bites and chew your food well to help aid in swallowing and digestion.  Avoid crusty breads and sticky, gummy foods, such as bananas, fresh doughy breads, rolls and doughnuts. These types of foods become sticky and difficult to swallow.  Toasted breads tend to be better tolerated.  Lastly, if you eat sweets, consume them at the end of your meal to avoid a group of symptoms referred to as "dumping syndrome". This describes  the rapid emptying of foods from the stomach to the small intestine. Sweetened beverages, candy and desserts move more rapidly and dump quickly into the intestines. This can cause symptoms of nausea, weakness, cold sweats, cramps, diarrhea and dizzy spells.  Avoiding Gas Avoid drinking through a straw. Do not chew gum or tobacco. These actions cause you to swallow air, which produces excess gas in your stomach. Chew with your mouth closed.  Avoid any foods that cause stomach gas and distention. These foods include corn, dried beans, peas, lentils, onions, broccoli, cauliflower and any food from the cabbage family.  Avoid carbonated drinks, alcohol, citrus and tomato products.  When will I be able to eat a soft diet? After Nissen fundoplication surgery, your diet will be advanced slowly by your surgeon. Generally, you will be on a clear liquid diet for the first few meals. Then you will advance to the full liquid diet for a meal or two and eventually to a Nissen soft diet. Please be aware that each patient's tolerance to food is different. Your doctor will advance your diet depending on how well you progress after surgery. Clear Liquid  Diet  The first diet after surgery is the clear liquid diet. It includes the following liquids: Apple juice  Cranberry juice  Grape juice  Chicken broth  Beef broth  Flavored gelatin (Jell-O)  Decaf tea and coffee  Caffeinated beverages are permitted based on tolerance  Popsicles  [place] ice Carbonated drinks (sodas) are not allowed for the first six to eight weeks after surgery. After this time you can try them again in small amounts.  Full Liquid Diet The full liquid diet contains anything on the clear liquid diet, plus: Milk, soy, rice and almond (no chocolate)  Cream of wheat, cream of rice, grits  Strained creamed soups (no tomato or broccoli)  Vanilla and strawberry-flavored ice cream  Sherbet  Blended, custard styled or whipped yogurt (plain or vanilla only)  Vanilla and butterscotch pudding (no chocolate or coconut)  Nutritional drinks including Ensure, Boost, Carnation Instant Breakfast (no chocolate-flavored) Note: Dairy products, such as milk, ice cream and pudding, may cause diarrhea in some people just after surgery. You may need to avoid milk products. If so, substitute them with lactose-free beverages, such as soy, rice, Lactaid or almond milks.  Nissen Soft Diet Food Category Foods to Choose Foods to Avoid  Beverages Milk, such as, whole, 2%, 1%, non-fat, or skim, soy, rice, almond  Caffeinated and decaf tea and coffee  Powdered drink mixes (in moderation)  Non-citrus juices (apple, grape, cranberry or blends of these)  Fruit nectars  Nutritional drinks including Boost, Ensure, Carnation Instant Breakfast Chocolate milk, cocoa or other chocolate-flavored drinks  Carbonated drinks  Alcohol  Citrus juices like orange, grapefruit, lemon and lime  Breads Pancakes, [place] toast and waffles  Crackers (saltine, butter, soda, graham, Goldfish and Cheese Nips)  Toasted bread Untoasted bread, bagels, [place] and hard rolls, English muffins  Crackers with nuts,  seeds, fresh or dried fruit, coconut, or highly seasoned, such as garlic or onion-flavored  Sweet rolls, coffee cake or doughnuts  Cereals Well cooked cereals, such as oatmeal (plain or flavored)  Cold cereal (Cornflakes, Rice Krispies, Cheerios, Special K plain, Rice Chex and puffed rice) Very coarse cereal, such as bran, shredded wheat  Any cereal with fresh or dried fruit, coconut, seeds or nuts  Desserts Eat in moderation and do not eat desserts or sweets by themselves. Plain cakes, cookies and cream-filled pies  Vanilla  and butterscotch pudding or custard  Ice cream, ice milk, frozen yogurt and sherbet  Gelatin made from allowed foods  Fruit ices and popsicles Desserts containing chocolate, coconut, nuts, seeds, fresh or dried fruit, peppermint or spearmint  Eggs  Poached, hard boiled or scrambled Fried eggs and highly seasoned eggs (deviled eggs)  Fats Eat in moderation. Butter and margarine  Mayonnaise and vegetable oils  Mildly seasoned cream sauces and gravies  Plain cream cheese  Sour cream Highly seasoned salad dressings, cream sauces and gravies  Bacon, bacon fat, ham fat, lard and salt pork  Fried foods  Nuts  Fruits Fruit juice  Any canned or cooked fruit except those listed in the AVOID column ALL fresh fruits, such as citrus, bananas and pineapple  Canned pineapple  Dried fruits, such as raisins, berries  Fruits with seeds, such as berries, kiwi and figs  Meat, Fish, Poultry, and [employer] may be ground, minced or chopped to ease swallowing and digestion  Tender, well cooked and moist cuts of beef, chicken, [place] and pork  Veal and lamb  Flaky, cooked fish  Canned tuna  Cottage and ricotta cheeses  Mild cheese, such as American, brick, mozzarella and baby Swiss  Creamy peanut butter  Plain custard or blended fruit yogurt  Moist casseroles, such as macaroni & cheese, tuna noodle  Grilled or toasted cheese sandwich Tough meats with a lot of gristle   Fried, highly seasoned, smoked and fatty meat, fish or poultry, such as frankfurters, luncheon meats, sausage, bacon, spare ribs, beef brisket, sardines, anchovies, duck and goose  Chili and other entrees made with pepper or chili pepper  Shellfish  Strongly flavored cheeses, such as sharp cheese, extra sharp cheddar, cheese containing peppers or other seasonings  Crunchy peanut butter  Any yogurt with nuts, seeds, coconut, strawberries or raspberries  Potatoes and Starches Peeled, mashed or boiled white or sweet potatoes  Oven-baked potatoes without skin  Well cooked white rice, enriched noodles, barley, spaghetti, macaroni and other pastas Fried potatoes, potato skins and potato chips  Hard and soft taco shells  Fried, brown or wild rice  Soups Mildly flavored meat stocks  Cream soups made from allowed foods Highly seasoned soups and tomato based soups, cream soups made with gas producing vegetables, such as broccoli, cauliflower, onion, etc.  Sweets and Snacks Use in moderation and do not eat large amounts of sweets by themselves. Syrup, honey, jelly and seedless jam  Plain hard candies and plain candies made with allowed ingredients  Molasses  Marshmallows  Other candy made from allowed ingredients  Thin pretzels Jam, marmalade and preserves  Chocolate in any form  Any candy containing nuts, coconut, seeds, peppermint, spearmint or dried or fresh fruit  Popcorn, potato chips, tortilla chips  Soft or hard thick pretzels, such as sourdough  Vegetables Well cooked soft vegetables without seeds or skins, such as asparagus tips, beets, carrots, green and wax beans, chopped spinach, tender canned baby peas, squash and pumpkin Raw vegetables, tomatoes, tomato juice, tomato sauce and V-8 juice  Gas producing vegetables, such as broccoli, Brussel sprouts, cabbage, cauliflower, onions, corn, cucumber, green peppers, rutabagas, turnips, radishes and sauerkraut  Dried beans, peas and lentils   Miscellaneous Salt and spices in moderation  Mustard and vinegar in moderation Fried or highly seasoned foods  Coconut and seeds  Pickles and olives  Chili sauces, ketchup, barbecue sauce, horseradish, black pepper, chili powder and onion and garlic seasonings  Any other strongly flavored seasoning, condiment, spice  or herb not tolerated  Any food not tolerated

## 2020-09-12 ENCOUNTER — Telehealth: Payer: Self-pay | Admitting: Surgery

## 2020-09-12 NOTE — Telephone Encounter (Signed)
Incoming call from the patient, she is now aware of all dates regarding her surgery and verbalized understanding.

## 2020-09-12 NOTE — Telephone Encounter (Signed)
Outgoing call is made, left message for patient to call.  Please advise patient of Pre-Admission date/time, COVID Testing date and Surgery date.  Surgery Date: 10/16/20 Preadmission Testing Date: 10/05/20 (phone 8a-1p) Covid Testing Date: 10/12/20 @ 8:15 am, patient advised to go to the [facility] ([place])   Also patient needs to call at [phone], between 1-3:00pm the day before surgery, to find out what time to arrive for surgery.

## 2020-09-12 NOTE — Progress Notes (Signed)
Patient ID: Tracey Perez, female   DOB: 10/29/1965, 55 y.o.   MRN: [id number]  HPI Tracey Perez is a 55 y.o. female seen in consultation at the request of Dr. Patsey Berthold.  She does have significant reflux and heartburn that is only partial relief with PPI.  She was recently being seen by Dr. Patsey Berthold from pulmonary medicine due to chronic cough.  Appropriate work-up revealed evidence of a large hiatal hernia with about half of her stomach within the mediastinum.  Please note that have personally reviewed all the images.  He does have a history of DVT and PE and is currently on Eliquis.  Had a prior abdominal hysterectomy and diagnostic laparoscopy due to endometriosis. Is able to perform more than 4 METS of activity without any shortness of breath or chest pain.  Her daughter is one of our robotic recall text.  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  HPI  Past Medical History:  Diagnosis Date  . Anemia    Ferritin anemia   . Anxiety   . Complication of anesthesia   . Depression   . DVT (deep vein thrombosis) in pregnancy   . Headache    chronic migraines   . Hypertension   . PONV (postoperative nausea and vomiting)   . Pulmonary embolism ([place])    hx of last one 10/20 hospitalized for one day at [facility] per patient followed by Dr Janese Banks   . Sleep apnea    does not uses cpap     Past Surgical History:  Procedure Laterality Date  . ABDOMINAL HYSTERECTOMY    . CESAREAN SECTION    . ESOPHAGOGASTRODUODENOSCOPY (EGD) WITH PROPOFOL N/A 09/04/2020   Procedure: ESOPHAGOGASTRODUODENOSCOPY (EGD) WITH PROPOFOL;  Surgeon: Jonathon Bellows, MD;  Location: [facility] ENDOSCOPY;  Service: Gastroenterology;  Laterality: N/A;  . laparoscopic surgery     . THYROID LOBECTOMY Right 08/08/2019   Procedure: RIGHT THYROID LOBECTOMY;  Surgeon: Armandina Gemma, MD;  Location: [facility] ORS;  Service: General;  Laterality: Right;  . TONSILLECTOMY      Family History  Problem  Relation Age of Onset  . Non-Hodgkin's lymphoma Mother   . Hypertension Father   . Depression Father   . Alcohol abuse Father   . Migraines Father   . Multiple sclerosis Sister   . Migraines Sister   . Migraines Brother   . Thyroid disease Brother   . Migraines Brother   . Thyroid disease Brother   . Thyroid disease Brother   . Thyroid disease Brother   . Breast cancer Maternal Aunt 60    Social History Social History   Tobacco Use  . Smoking status: Former Smoker    Packs/day: 1.00    Years: 25.00    Pack years: 25.00    Types: Cigarettes    Quit date: 12/17/2011    Years since quitting: 8.7  . Smokeless tobacco: Never Used  . Tobacco comment: quit in 2013   Vaping Use  . Vaping Use: Never used  Substance Use Topics  . Alcohol use: Not Currently  . Drug use: Never    Allergies  Allergen Reactions  . Morphine And Related Hives  . Nsaids Other (See Comments)    Affects platelet count  . Sulfa Antibiotics Shortness Of Breath  . Tape Other (See Comments)    bruises skin  . Verapamil Swelling  . Lisinopril Other (See Comments)    Burning throat, metallic taste  .  Other Itching    Pt states she is allergic to the plastic that IV bags are made of. Reaction causes itching relieved by Benadryl.   . Acetazolamide Other (See Comments)    Acid reflux   . Seroquel [Quetiapine Fumarate] Other (See Comments)    Muscle cramps    Current Outpatient Medications  Medication Sig Dispense Refill  . apixaban (ELIQUIS) 5 MG TABS tablet Take 5 mg by mouth 2 (two) times daily.    . Blood Pressure Monitoring (BLOOD PRESSURE KIT) KIT 1 kit by Does not apply route 2 (two) times daily. 1 kit 0  . Cholecalciferol (VITAMIN D) 125 MCG (5000 UT) CAPS Take 10,000 Units by mouth at bedtime.     . diphenhydrAMINE (BENADRYL) 25 mg capsule Take 50 mg by mouth 4 (four) times daily as needed for allergies or sleep. Patient taking 100 mg QHS as needed.    . gabapentin (NEURONTIN) 400 MG capsule  TAKE ONE CAPSULE BY MOUTH FOUR TIMES A DAY 360 capsule 5  . hydrALAZINE (APRESOLINE) 25 MG tablet TAKE ONE TABLET BY MOUTH EVERY DAY 90 tablet 1  . levothyroxine (SYNTHROID) 75 MCG tablet TAKE ONE TABLET BY MOUTH EVERY DAY BEFORE BREAKFAST 90 tablet 1  . Melatonin 10 MG TABS Take 10 mg by mouth at bedtime.    . mirtazapine (REMERON) 15 MG tablet TAKE ONE TABLET BY MOUTH AT BEDTIME 90 tablet 3  . omeprazole (PRILOSEC) 40 MG capsule TAKE ONE CAPSULE BY MOUTH EVERY DAY 60 capsule 3  . oxycodone (ROXICODONE) 30 MG immediate release tablet Take 30 mg by mouth 5 (five) times daily.    Marland Kitchen tiZANidine (ZANAFLEX) 4 MG tablet TAKE 4 TABLETS BY MOUTH AT BEDTIME 120 tablet 11  . venlafaxine XR (EFFEXOR-XR) 150 MG 24 hr capsule Take 300 mg by mouth daily.    Marland Kitchen zolpidem (AMBIEN) 10 MG tablet Take 10 mg by mouth at bedtime.     No current facility-administered medications for this visit.     Review of Systems Full ROS  was asked and was negative except for the information on the HPI  Physical Exam Blood pressure (!) 145/87, pulse 86, temperature 98.9 F (37.2 C), temperature source Oral, height $RemoveBefo'5\' 4"'kQEgRjxjDOq$  (1.626 m), weight 161 lb 9.6 oz (73.3 kg), SpO2 95 %. CONSTITUTIONAL: NAd  EYES: Pupils are equal, round,  Sclera are non-icteric. EARS, NOSE, MOUTH AND THROAT: She is wearing a mask, Hearing is intact to voice. LYMPH NODES:  Lymph nodes in the neck are normal. RESPIRATORY:  Lungs are clear. There is normal respiratory effort, with equal breath sounds bilaterally, and without pathologic use of accessory muscles. CARDIOVASCULAR: Heart is regular without murmurs, gallops, or rubs. GI: The abdomen is soft, nontender, and nondistended. There are no palpable masses. There is no hepatosplenomegaly. There are normal bowel sounds in all quadrants. GU: Rectal deferred.   MUSCULOSKELETAL: Normal muscle strength and tone. No cyanosis or edema.   SKIN: Turgor is good and there are no pathologic skin lesions or  ulcers. NEUROLOGIC: Motor and sensation is grossly normal. Cranial nerves are grossly intact. PSYCH:  Oriented to person, place and time. Affect is normal.  Data Reviewed  I have personally reviewed the patient's imaging, laboratory findings and medical records.    Assessment/Plan 55 yo female w Symptomatic paraesophageal type III hernia.  Discussed with the patient detail about her disease process.  Given symptoms and eyes I will definitely recommend repair. We will also order barium swallow as well as a  CT scan of the abdomen and pelvis since the CT of the chest did not reach far down to make appropriate assessment of the intra-abdominal cavity.  Procedure discussed with the patient in detail.  Risks, benefits and possible implications including but not limited to: Bleeding, infection, esophageal injuries, recurrence, bowel injuries.  We did talk about postoperative course as far as diet recommendations.  He seems to be in agreement with proceeding with surgery.  She will look at the schedule and likely get this surgery done next month or so.  I do think that is reasonable. She knows that we will need to bridge her with lovenox given her recurrent DVT/PE and risk of thromboembolic events. Will come back 1 more time to review the barium swallow studies and to further discuss surgical intervention.   Extensive counseling provided.  Caroleen Hamman, MD FACS General Surgeon 09/12/2020, 2:07 PM

## 2020-09-13 ENCOUNTER — Ambulatory Visit: Payer: BC Managed Care – PPO

## 2020-09-18 ENCOUNTER — Other Ambulatory Visit: Payer: Self-pay

## 2020-09-18 ENCOUNTER — Ambulatory Visit
Admission: RE | Admit: 2020-09-18 | Discharge: 2020-09-18 | Disposition: A | Discharge status: Home / Self Care | Payer: BC Managed Care – PPO | Source: Ambulatory Visit | Attending: Surgery | Admitting: Surgery

## 2020-09-18 ENCOUNTER — Other Ambulatory Visit: Payer: Self-pay | Admitting: Surgery

## 2020-09-18 DIAGNOSIS — K219 Gastro-esophageal reflux disease without esophagitis: Secondary | ICD-10-CM | POA: Diagnosis not present

## 2020-09-18 DIAGNOSIS — K449 Diaphragmatic hernia without obstruction or gangrene: Secondary | ICD-10-CM

## 2020-09-19 ENCOUNTER — Telehealth: Payer: Self-pay | Admitting: *Deleted

## 2020-09-19 ENCOUNTER — Telehealth: Payer: Self-pay

## 2020-09-19 ENCOUNTER — Ambulatory Visit: Payer: BC Managed Care – PPO

## 2020-09-19 NOTE — Telephone Encounter (Signed)
Faxed FMLA to [employer] at [phone] 

## 2020-09-19 NOTE — Telephone Encounter (Signed)
Left message letting patient know swallow study showed hiatal hernia and no other surprises per Dr.Pabon. Reminded of follow up appointment.

## 2020-09-26 ENCOUNTER — Ambulatory Visit
Admission: RE | Admit: 2020-09-26 | Discharge: 2020-09-26 | Disposition: A | Discharge status: Home / Self Care | Payer: BC Managed Care – PPO | Source: Ambulatory Visit | Attending: Surgery | Admitting: Surgery

## 2020-09-26 ENCOUNTER — Other Ambulatory Visit: Payer: Self-pay

## 2020-09-26 DIAGNOSIS — R1084 Generalized abdominal pain: Secondary | ICD-10-CM | POA: Insufficient documentation

## 2020-09-26 DIAGNOSIS — Z90710 Acquired absence of both cervix and uterus: Secondary | ICD-10-CM | POA: Diagnosis not present

## 2020-09-26 DIAGNOSIS — K449 Diaphragmatic hernia without obstruction or gangrene: Secondary | ICD-10-CM | POA: Diagnosis not present

## 2020-09-27 ENCOUNTER — Encounter: Payer: Self-pay | Admitting: Pulmonary Disease

## 2020-09-27 ENCOUNTER — Telehealth: Payer: Self-pay

## 2020-09-27 NOTE — Telephone Encounter (Signed)
-----   Message from Jules Husbands, MD sent at 09/27/2020  2:56 PM EDT ----- Please let her know CT shows hiatal hernia but no other surprises ----- Message ----- From: Interface, Rad Results In Sent: 09/27/2020   2:47 PM EDT To: Jules Husbands, MD

## 2020-09-27 NOTE — Telephone Encounter (Signed)
Message left for the patient letting her know only hiatal hernia seen on CT. She will follow up as scheduled.

## 2020-09-28 ENCOUNTER — Other Ambulatory Visit: Payer: Self-pay

## 2020-10-01 ENCOUNTER — Telehealth: Payer: Self-pay

## 2020-10-01 ENCOUNTER — Encounter: Payer: Self-pay | Admitting: Surgery

## 2020-10-01 ENCOUNTER — Ambulatory Visit: Payer: BC Managed Care – PPO | Admitting: Surgery

## 2020-10-01 ENCOUNTER — Other Ambulatory Visit: Payer: Self-pay

## 2020-10-01 VITALS — BP 128/87 | HR 89 | Temp 98.3°F | Ht 64.0 in | Wt 144.8 lb

## 2020-10-01 DIAGNOSIS — K449 Diaphragmatic hernia without obstruction or gangrene: Secondary | ICD-10-CM | POA: Diagnosis not present

## 2020-10-01 NOTE — Telephone Encounter (Signed)
Surgical clearance received for patient. Per Henrine Screws, patient will need an appointment prior to surgery scheduled on 10/16/20.

## 2020-10-01 NOTE — Progress Notes (Signed)
Request for Anti coagulation bridge has been faxed to Marnee Guarneri, NP. The patient's surgery is scheduled for 10/16/20.

## 2020-10-01 NOTE — Progress Notes (Signed)
Outpatient Surgical Follow Up  10/01/2020  Tracey Perez is an 55 y.o. female.   Chief Complaint  Patient presents with   Pre-op Exam    HPI: Tracey Perez is a 55 year old female well-known to me with a history of paraesophageal hernia type III that is symptomatic.  She has done well with some reflux but is actually controlled.  She did have a barium swallow that I personally reviewed showing evidence of a type III paraesophageal hernia with reflux no evidence of a strictures or other surprises.  CT scan also personally reviewed showing similar findings.  She is able to perform more than 4 METS of activity.  She does not have any other recent cardiac or pulmonary issues.  She is ready to move forward with her surgery.  Past Medical History:  Diagnosis Date   Anemia    Ferritin anemia    Anxiety    Complication of anesthesia    Depression    DVT (deep vein thrombosis) in pregnancy    Headache    chronic migraines    Hypertension    PONV (postoperative nausea and vomiting)    Pulmonary embolism ([facility])    hx of last one 10/20 hospitalized for one day at [facility] per patient followed by Dr Janese Banks    Sleep apnea    does not uses cpap     Past Surgical History:  Procedure Laterality Date   ABDOMINAL HYSTERECTOMY     CESAREAN SECTION     ESOPHAGOGASTRODUODENOSCOPY (EGD) WITH PROPOFOL N/A 09/04/2020   Procedure: ESOPHAGOGASTRODUODENOSCOPY (EGD) WITH PROPOFOL;  Surgeon: Jonathon Bellows, MD;  Location: [facility] ENDOSCOPY;  Service: Gastroenterology;  Laterality: N/A;   laparoscopic surgery      THYROID LOBECTOMY Right 08/08/2019   Procedure: RIGHT THYROID LOBECTOMY;  Surgeon: Armandina Gemma, MD;  Location: [facility] ORS;  Service: General;  Laterality: Right;   TONSILLECTOMY      Family History  Problem Relation Age of Onset   Non-Hodgkin's lymphoma Mother    Hypertension Father    Depression Father    Alcohol abuse Father    Migraines Father    Multiple sclerosis Sister    Migraines Sister    Migraines  Brother    Thyroid disease Brother    Migraines Brother    Thyroid disease Brother    Thyroid disease Brother    Thyroid disease Brother    Breast cancer Maternal Aunt 60    Social History:  reports that she quit smoking about 8 years ago. Her smoking use included cigarettes. She has a 25.00 pack-year smoking history. She has never used smokeless tobacco. She reports previous alcohol use. She reports that she does not use drugs.  Allergies:  Allergies  Allergen Reactions   Morphine And Related Hives   Nsaids Other (See Comments)    Affects platelet count   Sulfa Antibiotics Shortness Of Breath   Tape Other (See Comments)    bruises skin   Verapamil Swelling   Lisinopril Other (See Comments)    Burning throat, metallic taste   Other Itching    Pt states she is allergic to the plastic that IV bags are made of. Reaction causes itching relieved by Benadryl.    Acetazolamide Other (See Comments)    Acid reflux    Seroquel [Quetiapine Fumarate] Other (See Comments)    Muscle cramps    Medications reviewed.    ROS Full ROS performed and is otherwise negative other than what is stated in HPI   BP 128/87  Pulse 89   Temp 98.3 F (36.8 C) (Oral)   Ht 5\' 4"  (1.626 m)   Wt 144 lb 12.8 oz (65.7 kg)   SpO2 97%   BMI 24.85 kg/m   Physical Exam Vitals and nursing note reviewed. Exam conducted with a chaperone present.  Constitutional:      Appearance: Normal appearance. She is normal weight. She is not ill-appearing.  Eyes:     General: No scleral icterus.       Right eye: No discharge.        Left eye: No discharge.  Cardiovascular:     Rate and Rhythm: Normal rate and regular rhythm.     Heart sounds: No murmur heard. Pulmonary:     Effort: Pulmonary effort is normal. No respiratory distress.     Breath sounds: Normal breath sounds. No stridor. No wheezing.  Abdominal:     General: Abdomen is flat. There is no distension.     Palpations: Abdomen is soft. There is  no mass.     Tenderness: There is no abdominal tenderness. There is no guarding or rebound.     Hernia: No hernia is present.  Musculoskeletal:        General: No swelling or tenderness. Normal range of motion.     Cervical back: Normal range of motion and neck supple. No rigidity or tenderness.  Skin:    General: Skin is warm and dry.     Capillary Refill: Capillary refill takes less than 2 seconds.  Neurological:     General: No focal deficit present.     Mental Status: She is alert and oriented to person, place, and time.  Psychiatric:        Mood and Affect: Mood normal.        Behavior: Behavior normal.        Thought Content: Thought content normal.        Judgment: Judgment normal.    Compliant cooperative possible x-ray secondary  Assessment/Plan: 55 year old female with a paraesophageal hernia type III that is symptomatic.-Recommend repair in this.  I do think that she will be a good candidate for robotic approach.  Procedure discussed with patient detail.  Risks, benefits and possible occasions including but not limited to: Bleeding, infection, esophageal injuries, bowel injuries and chronic pain.  She is currently on anticoagulation for DVTs and PEs and she will need to be bridged given the high risk of DVTs.  We will touch base with PCP to arrange bridging. Greater than 50% of the 40 minutes  visit was spent in counseling/coordination of care   Caroleen Hamman, MD [place] Surgeon

## 2020-10-01 NOTE — Patient Instructions (Addendum)
You will have a bridge with Lovenox for surgery. Your primary care provider will let you know about this.   We have spoken today about repairing your Hiatal Hernia. Your surgery has been scheduled for 10/17/98 with Dr. Dahlia Byes.  Plan to be in the hospital for 1-2 days if the minimally invasive surgery is completed without having to make a bigger incision. If the bigger incision is made, you will most likely need to be in the hospital 4-5 days. You will be on a liquid diet and need to recover for 2 weeks following your surgery prior to doing any of your normal activities. At the 2 week mark, we will see you in the office and if you are doing ok we will advance your diet and activity level as you tolerate.  Please see your Blue (Pre-care) Sheet for more information regarding your surgery.  Please call our office with any questions or concerns that you have regarding your surgery and recovery.   Laparoscopic Nissen Fundoplication Laparoscopic Nissen fundoplication is surgery to relieve heartburn and other problems caused by gastric fluids flowing up into your esophagus. The esophagus is the tube that carries food and liquid from your throat to your stomach. Normally, the muscle that sits between your stomach and your esophagus (lower esophageal sphincter or LES) keeps stomach fluids in your stomach. In some people, the LES does not work properly, and stomach fluids flow up into the esophagus. This can happen when part of the stomach bulges through the LES (hiatal hernia). The backward flow of stomach fluids can cause a type of severe and long-standing heartburn that is called gastroesophageal reflux disease (GERD). You may need this surgery if other treatments for GERD have not helped. In a laparoscopic Nissen fundoplication, the upper part of your stomach is wrapped around the lower part of your esophagus to strengthen the LES and prevent reflux. If you have a hiatal hernia, it will also be repaired with  this surgery. The procedure is done through several small incisions in your abdomen. It is performed using a thin, telescopic instrument (laparoscope) and other instruments that can pass through the scope or through other small incisions. Tell a health care provider about: Any allergies you have. All medicines you are taking, including vitamins, herbs, eye drops, creams, and over-the-counter medicines. Any problems you or family members have had with anesthetic medicines. Any blood disorders you have. Any surgeries you have had. Any medical conditions you have. What are the risks? Generally, this is a safe procedure. However, problems may occur, including: Difficulty swallowing (dysphagia). Bloating. Nausea or vomiting. Damage to the lung, causing a collapsed lung. Infection or bleeding. What happens before the procedure? Ask your health care provider about: Changing or stopping your regular medicines. This is especially important if you are taking diabetes medicines or blood thinners. Taking medicines such as aspirin and ibuprofen. These medicines can thin your blood. Do not take these medicines before your procedure if your health care provider asks you not to. Follow your health care provider's instructions about eating or drinking restrictions. Plan to have someone take you home after the procedure. What happens during the procedure? An IV tube will be inserted into one of your veins. It will be used to give you fluids and medicines during the procedure. You will be given a medicine that makes you fall asleep (general anesthetic). Your abdomen will be cleaned with a germ-killing solution (antiseptic). The surgeon will make a small incision in your abdomen and insert  a tube through the incision. Your abdomen will be filled with a gas. This helps the surgeon to see your organs more easily and it makes more space to work. The surgeon will insert the laparoscope through the incision. The  scope has a camera that will send pictures to a monitor in the operating room. The surgeon will make several other small incisions in your abdomen to insert the other instruments that are needed during the procedure. Another instrument (dilator) will be passed through your mouth and down your esophagus into the upper part of your stomach. The dilator will prevent your LES from being closed too tightly during surgery. The surgeon will pass the top portion of your stomach behind the lower part of your esophagus and wrap it all the way around. This will be stitched into place. If you have a hiatal hernia, it will be repaired during this procedure. All instruments will be removed, and your incisions will be closed under your skin with stitches (sutures). Skin adhesive strips may also be used. A bandage (dressing) will be placed on your skin over the incisions. The procedure may vary among health care providers and hospitals. What happens after the procedure? You will be moved to a recovery area. Your blood pressure, heart rate, breathing rate, and blood oxygen level will be monitored often until the medicines you were given have worn off. You will be given pain medicine as needed. Your IV tube will be kept in until you are able to drink fluids. This information is not intended to replace advice given to you by your health care provider. Make sure you discuss any questions you have with your health care provider. Document Released: 04/28/2014 Document Revised: 09/13/2015 Document Reviewed: 12/07/2013 Elsevier Interactive Patient Education  2017 [employer]   Laparoscopic Nissen Fundoplication, Care After Refer to this sheet in the next few weeks. These instructions provide you with information about caring for yourself after your procedure. Your health care provider may also give you more specific instructions. Your treatment has been planned according to current medical practices, but problems  sometimes occur. Call your health care provider if you have any problems or questions after your procedure. What can I expect after the procedure? After the procedure, it is common to have: Difficulty swallowing (dysphagia). Excess gas (bloating). Follow these instructions at home: Medicines  Take medicines only as directed by your health care provider. Do not drive or operate heavy machinery while taking pain medicine. Incision care  There are many different ways to close and cover an incision, including stitches (sutures), skin glue, and adhesive strips. Follow your health care provider's instructions about: Incision care. Bandage (dressing) changes and removal. Incision closure removal. Check your incision areas every day for signs of infection. Watch for: Redness, swelling, or pain. Fluid, blood, or pus. Do not take baths, swim, or use a hot tub until your health care provider approves. Take showers as directed by your health care provider. Eating and drinking  Follow your health care provider's instructions about eating. You may need to follow a liquid-only diet for 2 weeks, followed by a diet of soft foods for 2 weeks. You should return to your usual diet gradually. Drink enough fluid to keep your urine clear or pale yellow. Activity  Return to your normal activities as directed by your health care provider. Ask your health care provider what activities are safe for you. Avoid strenuous exercise. Do not lift anything that is heavier than 10 lb (4.5 kg).  Ask your health care provider when you can: Return to sexual activity. Drive. Go back to work. Contact a health care provider if: You have a fever. Your pain gets worse or is not helped by medicine. You have frequent nausea or vomiting. You have continued abdominal bloating. You have an ongoing (persistent) cough. You have redness, swelling, or pain in any incision areas. You have fluid, blood, or pus coming from any  incisions. Get help right away if: You have trouble breathing. You are unable to swallow. You have persistent vomiting. You have blood in your vomit. You have severe abdominal pain. This information is not intended to replace advice given to you by your health care provider. Make sure you discuss any questions you have with your health care provider. Document Released: 11/29/2003 Document Revised: 09/13/2015 Document Reviewed: 12/07/2013 Elsevier Interactive Patient Education  2017 [employer].   Diet After Nissen Fundoplication Surgery This diet information is for patients who have recently had Nissen fundoplication surgery to correct reflux disease or to repair various types of hernias, such as hiatal hernia and intrathoracic stomach. This diet may also be used for other gastrointestinal surgeries, such as Heller myotomy and repair of achalasia. The diet will help control diarrhea, excess gas and swallowing problems, which may occur after this type of surgery. Keeping Your Stomach from Stretching Eat small, frequent meals (six to eight per day). This will help you consume the majority of the nutrients you need without causing your stomach to feel full or distended.  Drinking large amounts of fluids with meals can stretch your stomach. You may drink fluids between meals as often as you like, but limit fluids to 1/2 cup (4 fluid ounces) with meals and one cup (8 fluid ounces) with snacks.  Sit upright while eating and stay upright for 30 minutes after each meal. Gravity can help food move through your digestive tract. Do not lie down after eating. Sit upright for 2 hours after your last meal or snack of the day.  Eat very slowly. Take your time when eating.  Take small bites and chew your food well to help aid in swallowing and digestion.  Avoid crusty breads and sticky, gummy foods, such as bananas, fresh doughy breads, rolls and doughnuts. These types of foods become sticky and difficult to  swallow.  Toasted breads tend to be better tolerated.  Lastly, if you eat sweets, consume them at the end of your meal to avoid a group of symptoms referred to as "dumping syndrome". This describes the rapid emptying of foods from the stomach to the small intestine. Sweetened beverages, candy and desserts move more rapidly and dump quickly into the intestines. This can cause symptoms of nausea, weakness, cold sweats, cramps, diarrhea and dizzy spells.  Avoiding Gas Avoid drinking through a straw. Do not chew gum or tobacco. These actions cause you to swallow air, which produces excess gas in your stomach. Chew with your mouth closed.  Avoid any foods that cause stomach gas and distention. These foods include corn, dried beans, peas, lentils, onions, broccoli, cauliflower and any food from the cabbage family.  Avoid carbonated drinks, alcohol, citrus and tomato products.  When will I be able to eat a soft diet? After Nissen fundoplication surgery, your diet will be advanced slowly by your surgeon. Generally, you will be on a clear liquid diet for the first few meals. Then you will advance to the full liquid diet for a meal or two and eventually to a Nissen  soft diet. Please be aware that each patient's tolerance to food is different. Your doctor will advance your diet depending on how well you progress after surgery. Clear Liquid Diet  The first diet after surgery is the clear liquid diet. It includes the following liquids: Apple juice  Cranberry juice  Grape juice  Chicken broth  Beef broth  Flavored gelatin (Jell-O)  Decaf tea and coffee  Caffeinated beverages are permitted based on tolerance  Popsicles  [place] ice Carbonated drinks (sodas) are not allowed for the first six to eight weeks after surgery. After this time you can try them again in small amounts.  Full Liquid Diet The full liquid diet contains anything on the clear liquid diet, plus: Milk, soy, rice and almond (no  chocolate)  Cream of wheat, cream of rice, grits  Strained creamed soups (no tomato or broccoli)  Vanilla and strawberry-flavored ice cream  Sherbet  Blended, custard styled or whipped yogurt (plain or vanilla only)  Vanilla and butterscotch pudding (no chocolate or coconut)  Nutritional drinks including Ensure, Boost, Carnation Instant Breakfast (no chocolate-flavored) Note: Dairy products, such as milk, ice cream and pudding, may cause diarrhea in some people just after surgery. You may need to avoid milk products. If so, substitute them with lactose-free beverages, such as soy, rice, Lactaid or almond milks.  Nissen Soft Diet Food Category Foods to Choose Foods to Avoid  Beverages Milk, such as, whole, 2%, 1%, non-fat, or skim, soy, rice, almond  Caffeinated and decaf tea and coffee  Powdered drink mixes (in moderation)  Non-citrus juices (apple, grape, cranberry or blends of these)  Fruit nectars  Nutritional drinks including Boost, Ensure, Carnation Instant Breakfast Chocolate milk, cocoa or other chocolate-flavored drinks  Carbonated drinks  Alcohol  Citrus juices like orange, grapefruit, lemon and lime  Breads Pancakes, [place] toast and waffles  Crackers (saltine, butter, soda, graham, Goldfish and Cheese Nips)  Toasted bread Untoasted bread, bagels, [place] and hard rolls, English muffins  Crackers with nuts, seeds, fresh or dried fruit, coconut, or highly seasoned, such as garlic or onion-flavored  Sweet rolls, coffee cake or doughnuts  Cereals Well cooked cereals, such as oatmeal (plain or flavored)  Cold cereal (Cornflakes, Rice Krispies, Cheerios, Special K plain, Rice Chex and puffed rice) Very coarse cereal, such as bran, shredded wheat  Any cereal with fresh or dried fruit, coconut, seeds or nuts  Desserts Eat in moderation and do not eat desserts or sweets by themselves. Plain cakes, cookies and cream-filled pies  Vanilla and butterscotch pudding or custard   Ice cream, ice milk, frozen yogurt and sherbet  Gelatin made from allowed foods  Fruit ices and popsicles Desserts containing chocolate, coconut, nuts, seeds, fresh or dried fruit, peppermint or spearmint  Eggs  Poached, hard boiled or scrambled Fried eggs and highly seasoned eggs (deviled eggs)  Fats Eat in moderation. Butter and margarine  Mayonnaise and vegetable oils  Mildly seasoned cream sauces and gravies  Plain cream cheese  Sour cream Highly seasoned salad dressings, cream sauces and gravies  Bacon, bacon fat, ham fat, lard and salt pork  Fried foods  Nuts  Fruits Fruit juice  Any canned or cooked fruit except those listed in the AVOID column ALL fresh fruits, such as citrus, bananas and pineapple  Canned pineapple  Dried fruits, such as raisins, berries  Fruits with seeds, such as berries, kiwi and figs  Meat, Fish, Poultry, and [employer] may be ground, minced or chopped to ease swallowing  and digestion  Tender, well cooked and moist cuts of beef, chicken, [place] and pork  Veal and lamb  Flaky, cooked fish  Canned tuna  Cottage and ricotta cheeses  Mild cheese, such as American, brick, mozzarella and baby Swiss  Creamy peanut butter  Plain custard or blended fruit yogurt  Moist casseroles, such as macaroni & cheese, tuna noodle  Grilled or toasted cheese sandwich Tough meats with a lot of gristle  Fried, highly seasoned, smoked and fatty meat, fish or poultry, such as frankfurters, luncheon meats, sausage, bacon, spare ribs, beef brisket, sardines, anchovies, duck and goose  Chili and other entrees made with pepper or chili pepper  Shellfish  Strongly flavored cheeses, such as sharp cheese, extra sharp cheddar, cheese containing peppers or other seasonings  Crunchy peanut butter  Any yogurt with nuts, seeds, coconut, strawberries or raspberries  Potatoes and Starches Peeled, mashed or boiled white or sweet potatoes  Oven-baked potatoes without skin  Well  cooked white rice, enriched noodles, barley, spaghetti, macaroni and other pastas Fried potatoes, potato skins and potato chips  Hard and soft taco shells  Fried, brown or wild rice  Soups Mildly flavored meat stocks  Cream soups made from allowed foods Highly seasoned soups and tomato based soups, cream soups made with gas producing vegetables, such as broccoli, cauliflower, onion, etc.  Sweets and Snacks Use in moderation and do not eat large amounts of sweets by themselves. Syrup, honey, jelly and seedless jam  Plain hard candies and plain candies made with allowed ingredients  Molasses  Marshmallows  Other candy made from allowed ingredients  Thin pretzels Jam, marmalade and preserves  Chocolate in any form  Any candy containing nuts, coconut, seeds, peppermint, spearmint or dried or fresh fruit  Popcorn, potato chips, tortilla chips  Soft or hard thick pretzels, such as sourdough  Vegetables Well cooked soft vegetables without seeds or skins, such as asparagus tips, beets, carrots, green and wax beans, chopped spinach, tender canned baby peas, squash and pumpkin Raw vegetables, tomatoes, tomato juice, tomato sauce and V-8 juice  Gas producing vegetables, such as broccoli, Brussel sprouts, cabbage, cauliflower, onions, corn, cucumber, green peppers, rutabagas, turnips, radishes and sauerkraut  Dried beans, peas and lentils  Miscellaneous Salt and spices in moderation  Mustard and vinegar in moderation Fried or highly seasoned foods  Coconut and seeds  Pickles and olives  Chili sauces, ketchup, barbecue sauce, horseradish, black pepper, chili powder and onion and garlic seasonings  Any other strongly flavored seasoning, condiment, spice or herb not tolerated  Any food not tolerated

## 2020-10-01 NOTE — H&P (View-Only) (Signed)
Outpatient Surgical Follow Up  10/01/2020  Tracey Perez is an 55 y.o. female.   Chief Complaint  Patient presents with   Pre-op Exam    HPI: Tracey Perez is a 55 year old female well-known to me with a history of paraesophageal hernia type III that is symptomatic.  She has done well with some reflux but is actually controlled.  She did have a barium swallow that I personally reviewed showing evidence of a type III paraesophageal hernia with reflux no evidence of a strictures or other surprises.  CT scan also personally reviewed showing similar findings.  She is able to perform more than 4 METS of activity.  She does not have any other recent cardiac or pulmonary issues.  She is ready to move forward with her surgery.  Past Medical History:  Diagnosis Date   Anemia    Ferritin anemia    Anxiety    Complication of anesthesia    Depression    DVT (deep vein thrombosis) in pregnancy    Headache    chronic migraines    Hypertension    PONV (postoperative nausea and vomiting)    Pulmonary embolism (HCC)    hx of last one 10/20 hospitalized for one day at [facility] per patient followed by Dr Janese Banks    Sleep apnea    does not uses cpap     Past Surgical History:  Procedure Laterality Date   ABDOMINAL HYSTERECTOMY     CESAREAN SECTION     ESOPHAGOGASTRODUODENOSCOPY (EGD) WITH PROPOFOL N/A 09/04/2020   Procedure: ESOPHAGOGASTRODUODENOSCOPY (EGD) WITH PROPOFOL;  Surgeon: Jonathon Bellows, MD;  Location: [facility] ENDOSCOPY;  Service: Gastroenterology;  Laterality: N/A;   laparoscopic surgery      THYROID LOBECTOMY Right 08/08/2019   Procedure: RIGHT THYROID LOBECTOMY;  Surgeon: Armandina Gemma, MD;  Location: [facility] ORS;  Service: General;  Laterality: Right;   TONSILLECTOMY      Family History  Problem Relation Age of Onset   Non-Hodgkin's lymphoma Mother    Hypertension Father    Depression Father    Alcohol abuse Father    Migraines Father    Multiple sclerosis Sister    Migraines Sister    Migraines  Brother    Thyroid disease Brother    Migraines Brother    Thyroid disease Brother    Thyroid disease Brother    Thyroid disease Brother    Breast cancer Maternal Aunt 60    Social History:  reports that she quit smoking about 8 years ago. Her smoking use included cigarettes. She has a 25.00 pack-year smoking history. She has never used smokeless tobacco. She reports previous alcohol use. She reports that she does not use drugs.  Allergies:  Allergies  Allergen Reactions   Morphine And Related Hives   Nsaids Other (See Comments)    Affects platelet count   Sulfa Antibiotics Shortness Of Breath   Tape Other (See Comments)    bruises skin   Verapamil Swelling   Lisinopril Other (See Comments)    Burning throat, metallic taste   Other Itching    Pt states she is allergic to the plastic that IV bags are made of. Reaction causes itching relieved by Benadryl.    Acetazolamide Other (See Comments)    Acid reflux    Seroquel [Quetiapine Fumarate] Other (See Comments)    Muscle cramps    Medications reviewed.    ROS Full ROS performed and is otherwise negative other than what is stated in HPI   BP 128/87  Pulse 89   Temp 98.3 F (36.8 C) (Oral)   Ht 5\' 4"  (1.626 m)   Wt 144 lb 12.8 oz (65.7 kg)   SpO2 97%   BMI 24.85 kg/m   Physical Exam Vitals and nursing note reviewed. Exam conducted with a chaperone present.  Constitutional:      Appearance: Normal appearance. She is normal weight. She is not ill-appearing.  Eyes:     General: No scleral icterus.       Right eye: No discharge.        Left eye: No discharge.  Cardiovascular:     Rate and Rhythm: Normal rate and regular rhythm.     Heart sounds: No murmur heard. Pulmonary:     Effort: Pulmonary effort is normal. No respiratory distress.     Breath sounds: Normal breath sounds. No stridor. No wheezing.  Abdominal:     General: Abdomen is flat. There is no distension.     Palpations: Abdomen is soft. There is  no mass.     Tenderness: There is no abdominal tenderness. There is no guarding or rebound.     Hernia: No hernia is present.  Musculoskeletal:        General: No swelling or tenderness. Normal range of motion.     Cervical back: Normal range of motion and neck supple. No rigidity or tenderness.  Skin:    General: Skin is warm and dry.     Capillary Refill: Capillary refill takes less than 2 seconds.  Neurological:     General: No focal deficit present.     Mental Status: She is alert and oriented to person, place, and time.  Psychiatric:        Mood and Affect: Mood normal.        Behavior: Behavior normal.        Thought Content: Thought content normal.        Judgment: Judgment normal.    Compliant cooperative possible x-ray secondary  Assessment/Plan: 55 year old female with a paraesophageal hernia type III that is symptomatic.-Recommend repair in this.  I do think that she will be a good candidate for robotic approach.  Procedure discussed with patient detail.  Risks, benefits and possible occasions including but not limited to: Bleeding, infection, esophageal injuries, bowel injuries and chronic pain.  She is currently on anticoagulation for DVTs and PEs and she will need to be bridged given the high risk of DVTs.  We will touch base with PCP to arrange bridging. Greater than 50% of the 40 minutes  visit was spent in counseling/coordination of care   Caroleen Hamman, MD [place] Surgeon

## 2020-10-02 ENCOUNTER — Ambulatory Visit: Payer: BC Managed Care – PPO | Admitting: Nurse Practitioner

## 2020-10-02 NOTE — Telephone Encounter (Signed)
PT VERBALIZED UNDERSTANDING

## 2020-10-04 ENCOUNTER — Ambulatory Visit: Payer: BC Managed Care – PPO | Admitting: Nurse Practitioner

## 2020-10-05 ENCOUNTER — Ambulatory Visit (INDEPENDENT_AMBULATORY_CARE_PROVIDER_SITE_OTHER): Payer: BC Managed Care – PPO | Admitting: Nurse Practitioner

## 2020-10-05 ENCOUNTER — Other Ambulatory Visit
Admission: RE | Admit: 2020-10-05 | Discharge: 2020-10-05 | Disposition: A | Discharge status: Home / Self Care | Payer: BC Managed Care – PPO | Source: Ambulatory Visit | Attending: Surgery | Admitting: Surgery

## 2020-10-05 ENCOUNTER — Encounter: Payer: Self-pay | Admitting: Nurse Practitioner

## 2020-10-05 ENCOUNTER — Other Ambulatory Visit: Payer: Self-pay

## 2020-10-05 VITALS — BP 116/77 | HR 77 | Temp 98.8°F | Wt 145.8 lb

## 2020-10-05 DIAGNOSIS — Z86718 Personal history of other venous thrombosis and embolism: Secondary | ICD-10-CM | POA: Diagnosis not present

## 2020-10-05 DIAGNOSIS — Z86711 Personal history of pulmonary embolism: Secondary | ICD-10-CM

## 2020-10-05 DIAGNOSIS — K449 Diaphragmatic hernia without obstruction or gangrene: Secondary | ICD-10-CM | POA: Diagnosis not present

## 2020-10-05 DIAGNOSIS — G894 Chronic pain syndrome: Secondary | ICD-10-CM

## 2020-10-05 DIAGNOSIS — D6869 Other thrombophilia: Secondary | ICD-10-CM

## 2020-10-05 HISTORY — DX: Personal history of other diseases of the digestive system: Z87.19

## 2020-10-05 HISTORY — DX: Unspecified osteoarthritis, unspecified site: M19.90

## 2020-10-05 HISTORY — DX: Gastro-esophageal reflux disease without esophagitis: K21.9

## 2020-10-05 HISTORY — DX: Hypothyroidism, unspecified: E03.9

## 2020-10-05 MED ORDER — ENOXAPARIN SODIUM 40 MG/0.4ML IJ SOSY: 40.0000 mg | PREFILLED_SYRINGE | INTRAMUSCULAR | 0 refills | Status: DC

## 2020-10-05 NOTE — Assessment & Plan Note (Signed)
Chronic, ongoing with lifelong need for anticoagulation.  Continue Eliquis and send refills as needed.  Up to date on fills at this time.  Monitor CBC regularly and kidney function.

## 2020-10-05 NOTE — Progress Notes (Signed)
BP 116/77   Pulse 77   Temp 98.8 F (37.1 C) (Oral)   Wt 145 lb 12.8 oz (66.1 kg)   SpO2 97%   BMI 25.03 kg/m    Subjective:    Patient ID: Tracey Perez, female    DOB: Jun 21, 1965, 55 y.o.   MRN: [id number]  HPI: Tracey Perez is a 54 y.o. female  Chief Complaint  Patient presents with   Pre-op Exam    Patient states she was recently at [facility] that she was told to inform Tracey Perez about a constant numbness sensation and sharp pain when she is standing for periods of time. 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    Medication Problem    Patient states she would like discuss Eliquis medication prior surgery and if she needs to take the Levonox injection prior to surgery.    NEUROPATHY Currently taking Gabapentin 400 MG 4 times a day.  Returns to see him July 8th.  She thinks she has tried Lyrica in past.  Last B12 level March 2022 = 405.  Sees Dr. Luetta Nutting at [facility] for pain management -- they have discussed neuro visit in future.   Neuropathy status: stable  Satisfied with current treatment?: yes Medication side effects: no Medication compliance:  good compliance Location: legs & hands Pain: no Severity: mild  Quality:  burning Frequency: intermittent Bilateral: yes Symmetric: yes Numbness: no Decreased sensation: no Weakness: no Context: stable Alleviating factors: Gabapentin Aggravating factors: long standing on feet Treatments attempted: Gabapentin  PRE-OP EXAM: Having hiatal hernia repair with robotics = Dr. Dahlia Perez.  Surgery is scheduled for 28th.  Needs to bridge Lovenox, has used this before during surgeries.  Knows how to use Lovenox and inject.  Already has pre-op labs ordered for next Friday.  Only needs bridging orders.     Relevant past medical, surgical, family and social history reviewed and updated as  indicated. Interim medical history since our last visit reviewed. Allergies and medications reviewed and updated.  Review of Systems  Constitutional:  Negative for activity change, appetite change, diaphoresis, fatigue and fever.  Respiratory:  Negative for cough, chest tightness, shortness of breath and wheezing.   Cardiovascular:  Negative for chest pain, palpitations and leg swelling.  Gastrointestinal: Negative.   Musculoskeletal:  Positive for arthralgias.  Neurological: Negative.   Psychiatric/Behavioral: Negative.     Per HPI unless specifically indicated above     Objective:    BP 116/77   Pulse 77   Temp 98.8 F (37.1 C) (Oral)   Wt 145 lb 12.8 oz (66.1 kg)   SpO2 97%   BMI 25.03 kg/m   Wt Readings from Last 3 Encounters:  10/05/20 145 lb 12.8 oz (66.1 kg)  10/01/20 144 lb 12.8 oz (65.7 kg)  09/10/20 161 lb 9.6 oz (73.3 kg)    Physical Exam Vitals and nursing note reviewed.  Constitutional:      General: She is awake. She is not in acute distress.    Appearance: She is well-developed and well-groomed. She is not ill-appearing or toxic-appearing.  HENT:     Head: Normocephalic.     Right Ear: Hearing normal.     Left Ear: Hearing normal.  Eyes:     General: Lids are normal.        Right eye: No discharge.  Left eye: No discharge.     Conjunctiva/sclera: Conjunctivae normal.     Pupils: Pupils are equal, round, and reactive to light.  Neck:     Thyroid: No thyromegaly.     Vascular: No carotid bruit.  Cardiovascular:     Rate and Rhythm: Normal rate and regular rhythm.     Heart sounds: Normal heart sounds. No murmur heard.   No gallop.  Pulmonary:     Effort: Pulmonary effort is normal. No accessory muscle usage or respiratory distress.     Breath sounds: Normal breath sounds.  Abdominal:     General: Bowel sounds are normal.     Palpations: Abdomen is soft. There is no hepatomegaly or splenomegaly.  Musculoskeletal:     Cervical back: Normal  range of motion and neck supple.     Right lower leg: No edema.     Left lower leg: No edema.  Lymphadenopathy:     Cervical: No cervical adenopathy.  Skin:    General: Skin is warm and dry.  Neurological:     Mental Status: She is alert and oriented to person, place, and time.  Psychiatric:        Attention and Perception: Attention normal.        Mood and Affect: Mood normal.        Speech: Speech normal.        Behavior: Behavior normal. Behavior is cooperative.        Thought Content: Thought content normal.   Results for orders placed or performed in visit on 08/09/20  ECHOCARDIOGRAM COMPLETE  Result Value Ref Range   AR max vel 1.93 cm2   AV Peak grad 10.5 mmHg   Ao pk vel 1.62 m/s   S' Lateral 2.90 cm   Area-P 1/2 5.31 cm2   AV Area VTI 2.11 cm2   AV Mean grad 5.0 mmHg   Single Plane A4C EF 45.5 %   Single Plane A2C EF 63.1 %   Calc EF 56.2 %   AV Area mean vel 1.74 cm2      Assessment & Plan:   Problem List Items Addressed This Visit       Hematopoietic and Hemostatic   Other thrombophilia ([place]) - Primary    On lifelong Eliquis due to history of bilateral PE and history of DVT.  Monitor CBC regularly and monitor for increased bruising or bleeding.  Return to hematology as needed.         Other   History of pulmonary embolism    Chronic, ongoing with lifelong need for anticoagulation.  Continue Eliquis and send refills as needed.  Up to date on fills at this time.  Monitor CBC regularly and kidney function.         Chronic pain syndrome    Chronic, ongoing, followed by Dr. Holland Falling at [facility] in [place].  Continue this collaboration and medication as ordered by him, she is aware PCP does not perform chronic pain management and refills must come from pain management provider.  She plans on further discussing neuropathy pain with him.       History of DVT (deep vein thrombosis)    In 2005.  Chronic, ongoing with lifelong need for  anticoagulation.  Continue Eliquis and send refills as needed.  Up to date on fills at this time.  Monitor CBC regularly and kidney function.         Hiatal hernia    Scheduled for upcoming surgery  10/16/20 with Dr. Dahlia Perez, will plan for bridging of Apixaban due to her high risk factors for DVT or PE.  Recommend she take last dose Eliquis on June 20th and then start Lovenox 40 MG daily on June 21st, take daily until June 27th -- then hold dose day prior to surgery -- restart Lovenox after surgery and continue for 7 days post-up, then return to Apixaban,           Follow up plan: Return in about 5 months (around 03/06/2021) for Follow-up visit.Marland Kitchen

## 2020-10-05 NOTE — Assessment & Plan Note (Signed)
Scheduled for upcoming surgery 10/16/20 with Dr. Dahlia Byes, will plan for bridging of Apixaban due to her high risk factors for DVT or PE.  Recommend she take last dose Eliquis on June 20th and then start Lovenox 40 MG daily on June 21st, take daily until June 27th -- then hold dose day prior to surgery -- restart Lovenox after surgery and continue for 7 days post-up, then return to Apixaban,

## 2020-10-05 NOTE — Patient Instructions (Signed)
Enoxaparin Injection What is this medication? ENOXAPARIN (ee nox a PA rin) prevents or treats blood clots. It belongs to agroup of medications called blood thinners. This medicine may be used for other purposes; ask your health care provider orpharmacist if you have questions. COMMON BRAND NAME(S): Lovenox What should I tell my care team before I take this medication? They need to know if you have any of these conditions: Bleeding disorders, hemorrhage, or hemophilia Infection of the heart or heart valves Kidney or liver disease Previous stroke Prosthetic heart valve Recent surgery or delivery of a baby Ulcer in the stomach or intestine, diverticulitis, or other bowel disease An unusual or allergic reaction to enoxaparin, heparin, pork or pork products, other medications, foods, dyes, or preservatives Pregnant or trying to get pregnant Breast-feeding How should I use this medication? This medication is injected under the skin. You will be taught how to prepare and give it. For your therapy to work as well as possible, take each dose exactly as prescribed on the prescription label. Do not skip doses. Skipping or stopping this medication can increase your risk of a blood clot. Keep takingthis medication unless your care team tells you to stop. It is important that you put your used needles and syringes in a special sharps container. Do not put them in a trash can. If you do not have a sharpscontainer, call your care team to get one. This medication comes with INSTRUCTIONS FOR USE. Ask your pharmacist for directions on how to use this medicine. Read the information carefully. Talk toyour care team if you have questions. Talk to your care team about use of this medication in children. Special caremay be needed. Overdosage: If you think you have taken too much of this medicine contact apoison control center or emergency room at once. NOTE: This medicine is only for you. Do not share this medicine  with others. What if I miss a dose? If you miss a dose, take it as soon as you can. If it is almost time for yournext dose, take only that dose. Do not take double or extra doses. What may interact with this medication? Aspirin and aspirin-like medications Certain medications that treat or prevent blood clots Dipyridamole NSAIDs, medications for pain and inflammation, like ibuprofen or naproxen This list may not describe all possible interactions. Give your health care provider a list of all the medicines, herbs, non-prescription drugs, or dietary supplements you use. Also tell them if you smoke, drink alcohol, or use illegaldrugs. Some items may interact with your medicine. What should I watch for while using this medication? Visit your care team for regular checks on your progress. You may need blood work done while you are taking this medication. Your condition will be monitored carefully while you are receiving this medication. It is importantnot to miss any appointments. If you are going to need surgery or other procedure, tell your care team thatyou are using this medication. Using this medication for a long time may weaken your bones and increase therisk of bone fractures. Avoid sports and activities that might cause injury while you are using this medication. Severe falls or injuries can cause unseen bleeding. Be careful when using sharp tools or knives. Consider using an Copy. Take special care brushing or flossing your teeth. Report any injuries, bruising, or redspots on the skin to your care team. Wear a medical ID bracelet or chain. Carry a card that describes your diseaseand details of your medication and dosage times. What  side effects may I notice from receiving this medication? Side effects that you should report to your care team as soon as possible: Allergic reactions-skin rash, itching, hives, swelling of the face, lips, tongue, or throat Bleeding-bloody or black,  tar-like stools, vomiting blood or brown material that looks like coffee grounds, red or dark brown urine, small red or purple spots on skin, unusual bruising or bleeding Side effects that usually do not require medical attention (report to your careteam if they continue or are bothersome): Pain, redness, or irritation at the injection site Swelling of the ankles, hands, or feet This list may not describe all possible side effects. Call your doctor for medical advice about side effects. You may report side effects to FDA at1-800-FDA-1088. Where should I keep my medication? Keep out of the reach of children. Store at room temperature between 15 and 30 degrees C (59 and 86 degrees F). Do not freeze. If your injections have been specially prepared, you may need to store them in the refrigerator. Ask your pharmacist. Throw away any unusedmedication after the expiration date. NOTE: This sheet is a summary. It may not cover all possible information. If you have questions about this medicine, talk to your doctor, pharmacist, orhealth care provider.  2022 Elsevier/Gold Standard (2020-05-17 17:28:53)

## 2020-10-05 NOTE — Patient Instructions (Addendum)
Your procedure is scheduled on: 10/16/20 - Tuesday Report to the Registration Desk on the 1st floor of the [facility]. To find out your arrival time, please call [phone] between 1PM - 3PM on: 10/15/20 - Monday Report to [facility] at 830 am on 10/12/20 for Covid test, Labs and EKG.  REMEMBER: Instructions that are not followed completely may result in serious medical risk, up to and including death; or upon the discretion of your surgeon and anesthesiologist your surgery may need to be rescheduled.  Do not eat food after midnight the night before surgery.  No gum chewing, lozengers or hard candies.   TAKE THESE MEDICATIONS THE MORNING OF SURGERY WITH A SIP OF WATER:  - gabapentin (NEURONTIN) 400 MG capsule - levothyroxine (SYNTHROID) 75 MCG tablet - omeprazole (PRILOSEC) 40 MG capsule, take one the night before and one on the morning of surgery - helps to prevent nausea after surgery. - venlafaxine XR (EFFEXOR-XR) 150 MG 24 hr capsule  Follow recommendations from Cardiologist, Pulmonologist or PCP regarding stopping Aspirin, Coumadin, Plavix, Eliquis, Pradaxa, or Pletal.  One week prior to surgery: Stop Anti-inflammatories (NSAIDS) such as Advil, Aleve, Ibuprofen, Motrin, Naproxen, Naprosyn and Aspirin based products such as Excedrin, Goodys Powder, BC Powder.  Stop ANY OVER THE COUNTER supplements until after surgery.  You may to take Tylenol if needed for pain up until the day of surgery.  No Alcohol for 24 hours before or after surgery.  No Smoking including e-cigarettes for 24 hours prior to surgery.  No chewable tobacco products for at least 6 hours prior to surgery.  No nicotine patches on the day of surgery.  Do not use any "recreational" drugs for at least a week prior to your surgery.  Please be advised that the combination of cocaine and anesthesia may have negative outcomes, up to and including death. If you test positive for cocaine, your surgery will be  cancelled.  On the morning of surgery brush your teeth with toothpaste and water, you may rinse your mouth with mouthwash if you wish. Do not swallow any toothpaste or mouthwash.  Do not wear jewelry, make-up, hairpins, clips or nail polish.  Do not wear lotions, powders, or perfumes.   Do not shave body from the neck down 48 hours prior to surgery just in case you cut yourself which could leave a site for infection.  Also, freshly shaved skin may become irritated if using the CHG soap.  Contact lenses, hearing aids and dentures may not be worn into surgery.  Do not bring valuables to the hospital. [facility] is not responsible for any missing/lost belongings or valuables.   Use CHG Soap or wipes as directed on instruction sheet.  Notify your doctor if there is any change in your medical condition (cold, fever, infection).  Wear comfortable clothing (specific to your surgery type) to the hospital.  After surgery, you can help prevent lung complications by doing breathing exercises.  Take deep breaths and cough every 1-2 hours. Your doctor may order a device called an Incentive Spirometer to help you take deep breaths. When coughing or sneezing, hold a pillow firmly against your incision with both hands. This is called "splinting." Doing this helps protect your incision. It also decreases belly discomfort.  If you are being admitted to the hospital overnight, leave your suitcase in the car. After surgery it may be brought to your room.  If you are being discharged the day of surgery, you will not be allowed  to drive home. You will need a responsible adult (18 years or older) to drive you home and stay with you that night.   If you are taking public transportation, you will need to have a responsible adult (18 years or older) with you. Please confirm with your physician that it is acceptable to use public transportation.   Please call the [facility] at [phone] if you have any questions about these instructions.  Surgery Visitation Policy:  Patients undergoing a surgery or procedure may have one family member or support person with them as long as that person is not COVID-19 positive or experiencing its symptoms.  That person may remain in the waiting area during the procedure.  Inpatient Visitation:    Visiting hours are 7 a.m. to 8 p.m. Inpatients will be allowed two visitors daily. The visitors may change each day during the patient's stay. No visitors under the age of 80. Any visitor under the age of 24 must be accompanied by an adult. The visitor must pass COVID-19 screenings, use hand sanitizer when entering and exiting the patient's room and wear a mask at all times, including in the patient's room. Patients must also wear a mask when staff or their visitor are in the room. Masking is required regardless of vaccination status.

## 2020-10-05 NOTE — Assessment & Plan Note (Signed)
On lifelong Eliquis due to history of bilateral PE and history of DVT.  Monitor CBC regularly and monitor for increased bruising or bleeding.  Return to hematology as needed. 

## 2020-10-05 NOTE — Assessment & Plan Note (Addendum)
Chronic, ongoing, followed by Dr. Holland Falling at [facility] in [place].  Continue this collaboration and medication as ordered by him, she is aware PCP does not perform chronic pain management and refills must come from pain management provider.  She plans on further discussing neuropathy pain with him.

## 2020-10-05 NOTE — Assessment & Plan Note (Signed)
In 2005.  Chronic, ongoing with lifelong need for anticoagulation.  Continue Eliquis and send refills as needed.  Up to date on fills at this time.  Monitor CBC regularly and kidney function.

## 2020-10-12 ENCOUNTER — Other Ambulatory Visit
Admission: RE | Admit: 2020-10-12 | Discharge: 2020-10-12 | Disposition: A | Discharge status: Home / Self Care | Payer: BC Managed Care – PPO | Source: Ambulatory Visit | Attending: Surgery | Admitting: Surgery

## 2020-10-12 ENCOUNTER — Other Ambulatory Visit: Payer: Self-pay

## 2020-10-12 ENCOUNTER — Encounter: Payer: Self-pay | Admitting: Oncology

## 2020-10-12 ENCOUNTER — Telehealth: Payer: Self-pay

## 2020-10-12 DIAGNOSIS — Z8616 Personal history of COVID-19: Secondary | ICD-10-CM

## 2020-10-12 DIAGNOSIS — Z01818 Encounter for other preprocedural examination: Secondary | ICD-10-CM | POA: Diagnosis not present

## 2020-10-12 DIAGNOSIS — Z01810 Encounter for preprocedural cardiovascular examination: Secondary | ICD-10-CM | POA: Diagnosis not present

## 2020-10-12 DIAGNOSIS — U071 COVID-19: Secondary | ICD-10-CM | POA: Insufficient documentation

## 2020-10-12 HISTORY — DX: Personal history of COVID-19: Z86.16

## 2020-10-12 LAB — PROTIME-INR
INR: 1 (ref 0.8–1.2)
Prothrombin Time: 13 seconds (ref 11.4–15.2)

## 2020-10-12 LAB — SARS CORONAVIRUS 2 (TAT 6-24 HRS): SARS Coronavirus 2: POSITIVE — AB

## 2020-10-12 LAB — TYPE AND SCREEN
ABO/RH(D): B POS
Antibody Screen: NEGATIVE

## 2020-10-12 LAB — CBC
HCT: 32 % — ABNORMAL LOW (ref 36.0–46.0)
Hemoglobin: 11.4 g/dL — ABNORMAL LOW (ref 12.0–15.0)
MCH: 28.5 pg (ref 26.0–34.0)
MCHC: 35.6 g/dL (ref 30.0–36.0)
MCV: 80 fL (ref 80.0–100.0)
Platelets: 218 10*3/uL (ref 150–400)
RBC: 4 MIL/uL (ref 3.87–5.11)
RDW: 12.6 % (ref 11.5–15.5)
WBC: 3.7 10*3/uL — ABNORMAL LOW (ref 4.0–10.5)
nRBC: 0 % (ref 0.0–0.2)

## 2020-10-12 LAB — BASIC METABOLIC PANEL
Anion gap: 7 (ref 5–15)
BUN: 11 mg/dL (ref 6–20)
CO2: 32 mmol/L (ref 22–32)
Calcium: 9.1 mg/dL (ref 8.9–10.3)
Chloride: 101 mmol/L (ref 98–111)
Creatinine, Ser: 0.75 mg/dL (ref 0.44–1.00)
GFR, Estimated: >60 mL/min (ref >60)
Glucose, Bld: 115 mg/dL — ABNORMAL HIGH (ref 70–99)
Potassium: 2.3 mmol/L — CL (ref 3.5–5.1)
Sodium: 140 mmol/L (ref 135–145)

## 2020-10-12 LAB — APTT: aPTT: 29 seconds (ref 24–36)

## 2020-10-12 MED ORDER — POTASSIUM CHLORIDE CRYS ER 20 MEQ PO TBCR: 40.0000 meq | EXTENDED_RELEASE_TABLET | Freq: Two times a day (BID) | ORAL | 0 refills | Status: DC

## 2020-10-12 NOTE — Telephone Encounter (Signed)
Spoke with the patient. Per Dr Dahlia Byes her Potassium was very low. She reports that she is feeling fine. I have sent in a prescription for Potassium 40 meq po BID #40. She will pick this up now and will be sure to get two doses in today. She will call with any questions/concerns.

## 2020-10-12 NOTE — Progress Notes (Signed)
  [facility] Perioperative Services: Pre-Admission/Anesthesia Testing  Abnormal Lab Notification   Date: 10/12/20  Name: Tracey Perez MRN:   [id number]  Re: Abnormal labs noted during PAT appointment   Provider(s) Notified: Jules Husbands, MD Notification mode: Routed and/or faxed via [facility] LAB VALUE(S): Lab Results  Component Value Date   K 2.3 (LL) 10/12/2020   Notes:  Patient is scheduled for a ROBOT ASSISTED [name] on 10/16/2020. In review of her medication reconciliation, she is not currently prescribed any type of daily diuretic therapy.   In theory, the SMR (tizanidine) that she is taking could cause some degree of HYPOkalemia via transient renal K+ wasting syndrome, however she has been on this medication for quite sometime and K+ levels have been WNL. Last K+ was 3.8 mmol/L on 06/21/2020.   The only new medication that she has recently been started on his enoxaparin for preoperative bridging, however if any derangement were to occur, I would expect to see HYPERkalemia.   No changes noted to EKG performed today when compared to tracing from 02/02/2019; QTc 444 ms.   Sending to primary attending surgeon for review and optimization. Order placed to have K+ recheck on the day of her procedure to ensure correction of noted derangement.  This is a Community education officer; no formal response is required.  Honor Loh, MSN, APRN, FNP-C, CEN [facility]  Peri-operative Services Nurse Practitioner Phone: [phone] Fax: [phone] 10/12/20 11:36 AM

## 2020-10-12 NOTE — Addendum Note (Signed)
Addended by: Caroleen Hamman F on: 10/12/2020 02:23 PM   Modules accepted: Orders, SmartSet

## 2020-10-15 ENCOUNTER — Telehealth: Payer: Self-pay | Admitting: Surgery

## 2020-10-15 ENCOUNTER — Encounter: Payer: Self-pay | Admitting: Urgent Care

## 2020-10-15 ENCOUNTER — Other Ambulatory Visit
Admission: RE | Admit: 2020-10-15 | Discharge: 2020-10-15 | Disposition: A | Discharge status: Home / Self Care | Payer: BC Managed Care – PPO | Source: Ambulatory Visit | Attending: Surgery | Admitting: Surgery

## 2020-10-15 ENCOUNTER — Telehealth: Payer: Self-pay | Admitting: Urgent Care

## 2020-10-15 ENCOUNTER — Other Ambulatory Visit: Payer: Self-pay

## 2020-10-15 DIAGNOSIS — U071 COVID-19: Secondary | ICD-10-CM | POA: Insufficient documentation

## 2020-10-15 DIAGNOSIS — Z01812 Encounter for preprocedural laboratory examination: Secondary | ICD-10-CM | POA: Insufficient documentation

## 2020-10-15 LAB — SARS CORONAVIRUS 2 BY RT PCR (HOSPITAL ORDER, PERFORMED IN ~~LOC~~ HOSPITAL LAB): SARS Coronavirus 2: POSITIVE — AB

## 2020-10-15 NOTE — Telephone Encounter (Signed)
Updated information regarding rescheduled surgery.    Patient has been advised of Pre-Admission date/time, COVID Testing date and Surgery date.  Surgery Date: 11/01/20 Preadmission Testing Date: 10/05/20 (already done  Covid Testing Date: done 10/12/20 and also rapid done on 10/15/20, both were positive, therefore surgery had to be rescheduled.     Patient has been made aware to call [phone], between 1-3:00pm the day before surgery, to find out what time to arrive for surgery.

## 2020-10-15 NOTE — Progress Notes (Signed)
  [facility] Perioperative Services: Pre-Admission/Anesthesia Testing  Abnormal Lab Notification   Date: 10/15/20  Name: Tracey Perez MRN:   [id number]  Re: Abnormal labs noted during PAT appointment   Provider(s) Notified: Caroleen Hamman, MD Notification mode: Routed and/or faxed via [facility] LAB VALUE(S): Lab Results  Component Value Date   [place] (A) 10/15/2020    Notes:  Patient is scheduled for a [place] on 10/16/2020. This is a Community education officer; no formal response is required.  Honor Loh, MSN, APRN, FNP-C, CEN [facility]  Peri-operative Services Nurse Practitioner Phone: [phone] Fax: [phone] 10/15/20 12:32 PM

## 2020-10-15 NOTE — Telephone Encounter (Signed)
Updated information regarding rescheduled surgery.   Patient has been advised of Pre-Admission date/time, COVID Testing date and Surgery date.  Surgery Date: 10/29/20 Preadmission Testing Date: 09/1720 (phone done  Covid Testing Date: done   Patient has been made aware to call [phone], between 1-3:00pm the day before surgery, to find out what time to arrive for surgery.

## 2020-10-23 NOTE — Progress Notes (Unsigned)
Received Clearance from Marnee Guarneri, NP. The patient is cleared for surgery at [facility]. Jolene Cannady's office has set up the patient for Lovenox bridge.

## 2020-10-26 DIAGNOSIS — G894 Chronic pain syndrome: Secondary | ICD-10-CM | POA: Diagnosis not present

## 2020-10-26 DIAGNOSIS — Z79899 Other long term (current) drug therapy: Secondary | ICD-10-CM | POA: Diagnosis not present

## 2020-10-26 DIAGNOSIS — F418 Other specified anxiety disorders: Secondary | ICD-10-CM | POA: Diagnosis not present

## 2020-10-29 ENCOUNTER — Other Ambulatory Visit: Payer: Self-pay

## 2020-10-29 ENCOUNTER — Inpatient Hospital Stay: Payer: BC Managed Care – PPO | Admitting: Urgent Care

## 2020-10-29 ENCOUNTER — Encounter
Admission: RE | Disposition: A | Discharge status: Home / Self Care | Payer: Self-pay | Source: Home / Self Care | Attending: Surgery

## 2020-10-29 ENCOUNTER — Encounter: Payer: Self-pay | Admitting: Surgery

## 2020-10-29 ENCOUNTER — Inpatient Hospital Stay
Admission: RE | Admit: 2020-10-29 | Discharge: 2020-10-30 | DRG: 328 | Disposition: A | Discharge status: Home / Self Care | Payer: BC Managed Care – PPO | Attending: Surgery | Admitting: Surgery

## 2020-10-29 DIAGNOSIS — F419 Anxiety disorder, unspecified: Secondary | ICD-10-CM | POA: Diagnosis present

## 2020-10-29 DIAGNOSIS — Z90710 Acquired absence of both cervix and uterus: Secondary | ICD-10-CM

## 2020-10-29 DIAGNOSIS — Z803 Family history of malignant neoplasm of breast: Secondary | ICD-10-CM | POA: Diagnosis not present

## 2020-10-29 DIAGNOSIS — Z87891 Personal history of nicotine dependence: Secondary | ICD-10-CM | POA: Diagnosis not present

## 2020-10-29 DIAGNOSIS — Z8719 Personal history of other diseases of the digestive system: Secondary | ICD-10-CM

## 2020-10-29 DIAGNOSIS — K449 Diaphragmatic hernia without obstruction or gangrene: Principal | ICD-10-CM | POA: Diagnosis present

## 2020-10-29 DIAGNOSIS — I10 Essential (primary) hypertension: Secondary | ICD-10-CM | POA: Diagnosis not present

## 2020-10-29 DIAGNOSIS — Z8616 Personal history of COVID-19: Secondary | ICD-10-CM | POA: Diagnosis not present

## 2020-10-29 DIAGNOSIS — Z86718 Personal history of other venous thrombosis and embolism: Secondary | ICD-10-CM | POA: Diagnosis not present

## 2020-10-29 DIAGNOSIS — F32A Depression, unspecified: Secondary | ICD-10-CM | POA: Diagnosis not present

## 2020-10-29 DIAGNOSIS — Z807 Family history of other malignant neoplasms of lymphoid, hematopoietic and related tissues: Secondary | ICD-10-CM

## 2020-10-29 DIAGNOSIS — E039 Hypothyroidism, unspecified: Secondary | ICD-10-CM | POA: Diagnosis not present

## 2020-10-29 DIAGNOSIS — K219 Gastro-esophageal reflux disease without esophagitis: Secondary | ICD-10-CM | POA: Diagnosis present

## 2020-10-29 DIAGNOSIS — Z8249 Family history of ischemic heart disease and other diseases of the circulatory system: Secondary | ICD-10-CM | POA: Diagnosis not present

## 2020-10-29 DIAGNOSIS — Z9889 Other specified postprocedural states: Secondary | ICD-10-CM

## 2020-10-29 DIAGNOSIS — Z86711 Personal history of pulmonary embolism: Secondary | ICD-10-CM

## 2020-10-29 HISTORY — PX: XI ROBOTIC ASSISTED HIATAL HERNIA REPAIR: SHX6889

## 2020-10-29 HISTORY — PX: INSERTION OF MESH: SHX5868

## 2020-10-29 LAB — CBC
HCT: 39.8 % (ref 36.0–46.0)
Hemoglobin: 13.7 g/dL (ref 12.0–15.0)
MCH: 29.1 pg (ref 26.0–34.0)
MCHC: 34.4 g/dL (ref 30.0–36.0)
MCV: 84.5 fL (ref 80.0–100.0)
Platelets: 280 10*3/uL (ref 150–400)
RBC: 4.71 MIL/uL (ref 3.87–5.11)
RDW: 13.3 % (ref 11.5–15.5)
WBC: 12.6 10*3/uL — ABNORMAL HIGH (ref 4.0–10.5)
nRBC: 0 % (ref 0.0–0.2)

## 2020-10-29 LAB — POCT I-STAT, CHEM 8
BUN: 13 mg/dL (ref 6–20)
Calcium, Ion: 1.31 mmol/L (ref 1.15–1.40)
Chloride: 107 mmol/L (ref 98–111)
Creatinine, Ser: 0.9 mg/dL (ref 0.44–1.00)
Glucose, Bld: 101 mg/dL — ABNORMAL HIGH (ref 70–99)
HCT: 43 % (ref 36.0–46.0)
Hemoglobin: 14.6 g/dL (ref 12.0–15.0)
Potassium: 3.6 mmol/L (ref 3.5–5.1)
Sodium: 143 mmol/L (ref 135–145)
TCO2: 22 mmol/L (ref 22–32)

## 2020-10-29 LAB — HIV ANTIBODY (ROUTINE TESTING W REFLEX): HIV Screen 4th Generation wRfx: NONREACTIVE

## 2020-10-29 LAB — CREATININE, SERUM
Creatinine, Ser: 0.77 mg/dL (ref 0.44–1.00)
GFR, Estimated: >60 mL/min (ref >60)

## 2020-10-29 SURGERY — REPAIR, HERNIA, HIATAL, ROBOT-ASSISTED
Anesthesia: General

## 2020-10-29 MED ORDER — DEXAMETHASONE SODIUM PHOSPHATE 10 MG/ML IJ SOLN
INTRAMUSCULAR | Status: AC
  Filled 2020-10-29: qty 1

## 2020-10-29 MED ORDER — PROCHLORPERAZINE EDISYLATE 10 MG/2ML IJ SOLN
5.0000 mg | Freq: Four times a day (QID) | INTRAMUSCULAR | Status: DC | PRN
Start: 2020-10-29 — End: 2020-10-30

## 2020-10-29 MED ORDER — HYDRALAZINE HCL 25 MG PO TABS
25.0000 mg | ORAL_TABLET | Freq: Every day | ORAL | Status: DC
  Administered 2020-10-29: 25 mg via ORAL
  Filled 2020-10-29: qty 1

## 2020-10-29 MED ORDER — VISTASEAL 10 ML SINGLE DOSE KIT
PACK | CUTANEOUS | Status: DC | PRN
  Administered 2020-10-29: 10 mL via TOPICAL

## 2020-10-29 MED ORDER — MIDAZOLAM HCL 2 MG/2ML IJ SOLN
INTRAMUSCULAR | Status: AC
  Filled 2020-10-29: qty 2

## 2020-10-29 MED ORDER — PHENYLEPHRINE HCL (PRESSORS) 10 MG/ML IV SOLN
INTRAVENOUS | Status: DC | PRN
  Administered 2020-10-29 (×2): 100 ug via INTRAVENOUS

## 2020-10-29 MED ORDER — HYDROMORPHONE HCL 1 MG/ML IJ SOLN: 1.0000 mg | INTRAMUSCULAR | Status: DC | PRN

## 2020-10-29 MED ORDER — GABAPENTIN 300 MG PO CAPS: 300.0000 mg | ORAL_CAPSULE | ORAL | Status: DC

## 2020-10-29 MED ORDER — MIRTAZAPINE 15 MG PO TABS
15.0000 mg | ORAL_TABLET | Freq: Every day | ORAL | Status: DC
  Administered 2020-10-29: 15 mg via ORAL
  Filled 2020-10-29: qty 1

## 2020-10-29 MED ORDER — BUPIVACAINE LIPOSOME 1.3 % IJ SUSP
INTRAMUSCULAR | Status: AC
  Filled 2020-10-29: qty 20

## 2020-10-29 MED ORDER — FENTANYL CITRATE (PF) 100 MCG/2ML IJ SOLN
INTRAMUSCULAR | Status: DC | PRN
  Administered 2020-10-29 (×4): 50 ug via INTRAVENOUS

## 2020-10-29 MED ORDER — ONDANSETRON HCL 4 MG/2ML IJ SOLN
INTRAMUSCULAR | Status: AC
  Filled 2020-10-29: qty 2

## 2020-10-29 MED ORDER — CHLORHEXIDINE GLUCONATE CLOTH 2 % EX PADS
6.0000 | MEDICATED_PAD | Freq: Once | CUTANEOUS | Status: AC
  Administered 2020-10-29: 6 via TOPICAL

## 2020-10-29 MED ORDER — LEVOTHYROXINE SODIUM 50 MCG PO TABS
75.0000 ug | ORAL_TABLET | Freq: Every day | ORAL | Status: DC
  Administered 2020-10-30: 75 ug via ORAL
  Filled 2020-10-29: qty 2

## 2020-10-29 MED ORDER — CHLORHEXIDINE GLUCONATE 0.12 % MT SOLN
OROMUCOSAL | Status: AC
  Administered 2020-10-29: 15 mL via OROMUCOSAL
  Filled 2020-10-29: qty 15

## 2020-10-29 MED ORDER — FENTANYL CITRATE (PF) 100 MCG/2ML IJ SOLN
25.0000 ug | INTRAMUSCULAR | Status: DC | PRN
  Administered 2020-10-29: 50 ug via INTRAVENOUS

## 2020-10-29 MED ORDER — PROPOFOL 10 MG/ML IV BOLUS
INTRAVENOUS | Status: DC | PRN
  Administered 2020-10-29: 120 mg via INTRAVENOUS

## 2020-10-29 MED ORDER — PHENYLEPHRINE HCL (PRESSORS) 10 MG/ML IV SOLN
INTRAVENOUS | Status: AC
  Filled 2020-10-29: qty 1

## 2020-10-29 MED ORDER — DIPHENHYDRAMINE HCL 12.5 MG/5ML PO ELIX
12.5000 mg | ORAL_SOLUTION | Freq: Four times a day (QID) | ORAL | Status: DC | PRN
  Filled 2020-10-29: qty 5

## 2020-10-29 MED ORDER — CEFAZOLIN SODIUM-DEXTROSE 2-4 GM/100ML-% IV SOLN
2.0000 g | INTRAVENOUS | Status: DC
  Filled 2020-10-29: qty 100

## 2020-10-29 MED ORDER — HYDROMORPHONE HCL 1 MG/ML IJ SOLN
INTRAMUSCULAR | Status: AC
  Administered 2020-10-29: 0.5 mg via INTRAVENOUS
  Filled 2020-10-29: qty 1

## 2020-10-29 MED ORDER — ONDANSETRON HCL 4 MG/2ML IJ SOLN
INTRAMUSCULAR | Status: DC | PRN
  Administered 2020-10-29: 4 mg via INTRAVENOUS

## 2020-10-29 MED ORDER — ORAL CARE MOUTH RINSE: 15.0000 mL | Freq: Once | OROMUCOSAL | Status: AC

## 2020-10-29 MED ORDER — ACETAMINOPHEN 500 MG PO TABS
1000.0000 mg | ORAL_TABLET | Freq: Four times a day (QID) | ORAL | Status: DC
  Administered 2020-10-29 – 2020-10-30 (×3): 1000 mg via ORAL
  Filled 2020-10-29 (×3): qty 2

## 2020-10-29 MED ORDER — SODIUM CHLORIDE 0.9 % IV SOLN: INTRAVENOUS | Status: DC

## 2020-10-29 MED ORDER — ZOLPIDEM TARTRATE 5 MG PO TABS: 10.0000 mg | ORAL_TABLET | Freq: Every day | ORAL | Status: DC

## 2020-10-29 MED ORDER — LIDOCAINE HCL (PF) 2 % IJ SOLN
INTRAMUSCULAR | Status: AC
  Filled 2020-10-29: qty 5

## 2020-10-29 MED ORDER — ACETAMINOPHEN 500 MG PO TABS
ORAL_TABLET | ORAL | Status: AC
  Administered 2020-10-29: 1000 mg via ORAL
  Filled 2020-10-29: qty 2

## 2020-10-29 MED ORDER — ACETAMINOPHEN 500 MG PO TABS: 1000.0000 mg | ORAL_TABLET | ORAL | Status: AC

## 2020-10-29 MED ORDER — SCOPOLAMINE 1 MG/3DAYS TD PT72
MEDICATED_PATCH | TRANSDERMAL | Status: AC
  Administered 2020-10-29: 1.5 mg via TRANSDERMAL
  Filled 2020-10-29: qty 1

## 2020-10-29 MED ORDER — TIZANIDINE HCL 4 MG PO TABS
12.0000 mg | ORAL_TABLET | Freq: Every day | ORAL | Status: DC
  Administered 2020-10-29: 12 mg via ORAL
  Filled 2020-10-29 (×2): qty 3

## 2020-10-29 MED ORDER — ONDANSETRON HCL 4 MG/2ML IJ SOLN: 4.0000 mg | Freq: Once | INTRAMUSCULAR | Status: DC | PRN

## 2020-10-29 MED ORDER — EPHEDRINE 5 MG/ML INJ
INTRAVENOUS | Status: AC
  Filled 2020-10-29: qty 10

## 2020-10-29 MED ORDER — DIPHENHYDRAMINE HCL 50 MG/ML IJ SOLN: 12.5000 mg | Freq: Four times a day (QID) | INTRAMUSCULAR | Status: DC | PRN

## 2020-10-29 MED ORDER — APREPITANT 40 MG PO CAPS
ORAL_CAPSULE | ORAL | Status: AC
  Administered 2020-10-29: 40 mg via ORAL
  Filled 2020-10-29: qty 1

## 2020-10-29 MED ORDER — HYDROMORPHONE HCL 1 MG/ML IJ SOLN
INTRAMUSCULAR | Status: AC
  Filled 2020-10-29: qty 1

## 2020-10-29 MED ORDER — SUGAMMADEX SODIUM 200 MG/2ML IV SOLN
INTRAVENOUS | Status: DC | PRN
  Administered 2020-10-29: 200 mg via INTRAVENOUS

## 2020-10-29 MED ORDER — HYDROMORPHONE HCL 1 MG/ML IJ SOLN
0.5000 mg | INTRAMUSCULAR | Status: AC | PRN
  Administered 2020-10-29 (×2): 0.5 mg via INTRAVENOUS

## 2020-10-29 MED ORDER — MELATONIN 5 MG PO TABS
10.0000 mg | ORAL_TABLET | Freq: Every day | ORAL | Status: DC
  Administered 2020-10-29: 10 mg via ORAL
  Filled 2020-10-29: qty 2

## 2020-10-29 MED ORDER — BUPIVACAINE-EPINEPHRINE 0.25% -1:200000 IJ SOLN
INTRAMUSCULAR | Status: DC | PRN
  Administered 2020-10-29: 30 mL

## 2020-10-29 MED ORDER — FENTANYL CITRATE (PF) 100 MCG/2ML IJ SOLN
INTRAMUSCULAR | Status: AC
  Filled 2020-10-29: qty 2

## 2020-10-29 MED ORDER — KETOROLAC TROMETHAMINE 15 MG/ML IJ SOLN
INTRAMUSCULAR | Status: AC
  Administered 2020-10-29: 15 mg via INTRAVENOUS
  Filled 2020-10-29: qty 1

## 2020-10-29 MED ORDER — OXYCODONE HCL 5 MG PO TABS: 5.0000 mg | ORAL_TABLET | ORAL | Status: DC | PRN

## 2020-10-29 MED ORDER — APREPITANT 40 MG PO CAPS: 40.0000 mg | ORAL_CAPSULE | Freq: Once | ORAL | Status: AC

## 2020-10-29 MED ORDER — ROCURONIUM BROMIDE 100 MG/10ML IV SOLN
INTRAVENOUS | Status: DC | PRN
  Administered 2020-10-29: 50 mg via INTRAVENOUS
  Administered 2020-10-29 (×3): 20 mg via INTRAVENOUS

## 2020-10-29 MED ORDER — CHLORHEXIDINE GLUCONATE 0.12 % MT SOLN: 15.0000 mL | Freq: Once | OROMUCOSAL | Status: AC

## 2020-10-29 MED ORDER — METHOCARBAMOL 500 MG PO TABS: 500.0000 mg | ORAL_TABLET | Freq: Four times a day (QID) | ORAL | Status: DC | PRN

## 2020-10-29 MED ORDER — DEXAMETHASONE SODIUM PHOSPHATE 10 MG/ML IJ SOLN
INTRAMUSCULAR | Status: DC | PRN
  Administered 2020-10-29: 5 mg via INTRAVENOUS

## 2020-10-29 MED ORDER — ONDANSETRON 4 MG PO TBDP: 4.0000 mg | ORAL_TABLET | Freq: Four times a day (QID) | ORAL | Status: DC | PRN

## 2020-10-29 MED ORDER — POTASSIUM CHLORIDE CRYS ER 20 MEQ PO TBCR
40.0000 meq | EXTENDED_RELEASE_TABLET | Freq: Two times a day (BID) | ORAL | Status: DC
  Administered 2020-10-29 (×2): 40 meq via ORAL
  Filled 2020-10-29 (×3): qty 2

## 2020-10-29 MED ORDER — HYDROMORPHONE HCL 1 MG/ML IJ SOLN
0.5000 mg | INTRAMUSCULAR | Status: AC | PRN
  Administered 2020-10-29: 0.5 mg via INTRAVENOUS

## 2020-10-29 MED ORDER — KETOROLAC TROMETHAMINE 15 MG/ML IJ SOLN: 15.0000 mg | Freq: Once | INTRAMUSCULAR | Status: AC

## 2020-10-29 MED ORDER — PROCHLORPERAZINE MALEATE 10 MG PO TABS
10.0000 mg | ORAL_TABLET | Freq: Four times a day (QID) | ORAL | Status: DC | PRN
  Filled 2020-10-29: qty 1

## 2020-10-29 MED ORDER — OXYCODONE HCL 5 MG PO TABS
30.0000 mg | ORAL_TABLET | Freq: Every day | ORAL | Status: DC
  Administered 2020-10-29 – 2020-10-30 (×5): 30 mg via ORAL
  Filled 2020-10-29 (×5): qty 6

## 2020-10-29 MED ORDER — FENTANYL CITRATE (PF) 100 MCG/2ML IJ SOLN
INTRAMUSCULAR | Status: AC
  Administered 2020-10-29: 50 ug via INTRAVENOUS
  Filled 2020-10-29: qty 2

## 2020-10-29 MED ORDER — SCOPOLAMINE 1 MG/3DAYS TD PT72: 1.0000 | MEDICATED_PATCH | TRANSDERMAL | Status: DC

## 2020-10-29 MED ORDER — LACTATED RINGERS IV SOLN: INTRAVENOUS | Status: DC

## 2020-10-29 MED ORDER — LIDOCAINE HCL (CARDIAC) PF 100 MG/5ML IV SOSY
PREFILLED_SYRINGE | INTRAVENOUS | Status: DC | PRN
  Administered 2020-10-29: 60 mg via INTRAVENOUS

## 2020-10-29 MED ORDER — BUPIVACAINE-EPINEPHRINE (PF) 0.25% -1:200000 IJ SOLN
INTRAMUSCULAR | Status: AC
  Filled 2020-10-29: qty 30

## 2020-10-29 MED ORDER — ETOMIDATE 2 MG/ML IV SOLN
INTRAVENOUS | Status: AC
  Filled 2020-10-29: qty 10

## 2020-10-29 MED ORDER — GABAPENTIN 400 MG PO CAPS
400.0000 mg | ORAL_CAPSULE | Freq: Four times a day (QID) | ORAL | Status: DC
  Administered 2020-10-29 – 2020-10-30 (×4): 400 mg via ORAL
  Filled 2020-10-29 (×4): qty 1

## 2020-10-29 MED ORDER — ENOXAPARIN SODIUM 40 MG/0.4ML IJ SOSY
40.0000 mg | PREFILLED_SYRINGE | INTRAMUSCULAR | Status: DC
  Filled 2020-10-29: qty 0.4

## 2020-10-29 MED ORDER — ZOLPIDEM TARTRATE 5 MG PO TABS
5.0000 mg | ORAL_TABLET | Freq: Every day | ORAL | Status: DC
  Administered 2020-10-29: 5 mg via ORAL
  Filled 2020-10-29: qty 1

## 2020-10-29 MED ORDER — CEFAZOLIN SODIUM-DEXTROSE 2-4 GM/100ML-% IV SOLN
2.0000 g | INTRAVENOUS | Status: AC
Start: 2020-10-29 — End: 2020-10-29
  Administered 2020-10-29: 2 g via INTRAVENOUS

## 2020-10-29 MED ORDER — VENLAFAXINE HCL ER 75 MG PO CP24
300.0000 mg | ORAL_CAPSULE | Freq: Every morning | ORAL | Status: DC
  Administered 2020-10-30: 300 mg via ORAL
  Filled 2020-10-29: qty 4

## 2020-10-29 MED ORDER — SODIUM CHLORIDE 0.9 % IR SOLN
Status: DC | PRN
  Administered 2020-10-29: 200 mL

## 2020-10-29 MED ORDER — ONDANSETRON HCL 4 MG/2ML IJ SOLN: 4.0000 mg | Freq: Four times a day (QID) | INTRAMUSCULAR | Status: DC | PRN

## 2020-10-29 MED ORDER — CEFAZOLIN SODIUM-DEXTROSE 2-4 GM/100ML-% IV SOLN
INTRAVENOUS | Status: AC
  Filled 2020-10-29: qty 100

## 2020-10-29 MED ORDER — MEPERIDINE HCL 25 MG/ML IJ SOLN: 6.2500 mg | INTRAMUSCULAR | Status: DC | PRN

## 2020-10-29 MED ORDER — MIDAZOLAM HCL 2 MG/2ML IJ SOLN
INTRAMUSCULAR | Status: DC | PRN
  Administered 2020-10-29: 2 mg via INTRAVENOUS

## 2020-10-29 MED ORDER — VISTASEAL 10 ML SINGLE DOSE KIT
PACK | CUTANEOUS | Status: AC
  Filled 2020-10-29: qty 10

## 2020-10-29 MED ORDER — PROPOFOL 10 MG/ML IV BOLUS
INTRAVENOUS | Status: AC
  Filled 2020-10-29: qty 20

## 2020-10-29 MED ORDER — BUPIVACAINE LIPOSOME 1.3 % IJ SUSP
INTRAMUSCULAR | Status: DC | PRN
  Administered 2020-10-29: 20 mL

## 2020-10-29 MED ORDER — FLEET ENEMA 7-19 GM/118ML RE ENEM: 1.0000 | ENEMA | Freq: Once | RECTAL | Status: DC | PRN

## 2020-10-29 SURGICAL SUPPLY — 59 items
APPLICATOR VISTASEAL 35 (MISCELLANEOUS) ×3 IMPLANT
CANISTER SUCT 1200ML W/VALVE (MISCELLANEOUS) IMPLANT
CANNULA REDUC XI 12-8 STAPL (CANNULA) ×1
CANNULA REDUCER 12-8 DVNC XI (CANNULA) ×2 IMPLANT
CHLORAPREP W/TINT 26 (MISCELLANEOUS) ×3 IMPLANT
DECANTER SPIKE VIAL GLASS SM (MISCELLANEOUS) ×3 IMPLANT
DEFOGGER SCOPE WARMER CLEARIFY (MISCELLANEOUS) ×3 IMPLANT
DERMABOND ADVANCED (GAUZE/BANDAGES/DRESSINGS) ×2
DERMABOND ADVANCED .7 DNX12 (GAUZE/BANDAGES/DRESSINGS) ×4 IMPLANT
DRAPE 3/4 80X56 (DRAPES) IMPLANT
DRAPE ARM DVNC X/XI (DISPOSABLE) ×8 IMPLANT
DRAPE COLUMN DVNC XI (DISPOSABLE) ×2 IMPLANT
DRAPE DA VINCI XI ARM (DISPOSABLE) ×4
DRAPE DA VINCI XI COLUMN (DISPOSABLE) ×1
ELECT CAUTERY BLADE 6.4 (BLADE) ×3 IMPLANT
ELECT REM PT RETURN 9FT ADLT (ELECTROSURGICAL) ×3
ELECTRODE REM PT RTRN 9FT ADLT (ELECTROSURGICAL) ×2 IMPLANT
GAUZE 4X4 16PLY ~~LOC~~+RFID DBL (SPONGE) IMPLANT
GLOVE SURG ENC MOIS LTX SZ7 (GLOVE) ×12 IMPLANT
GOWN STRL REUS W/ TWL LRG LVL3 (GOWN DISPOSABLE) ×8 IMPLANT
GOWN STRL REUS W/TWL LRG LVL3 (GOWN DISPOSABLE) ×4
GRASPER LAPSCPC 5X45 DSP (INSTRUMENTS) ×3 IMPLANT
IRRIGATION STRYKERFLOW (MISCELLANEOUS) IMPLANT
IRRIGATOR STRYKERFLOW (MISCELLANEOUS)
IV NS 1000ML (IV SOLUTION)
IV NS 1000ML BAXH (IV SOLUTION) IMPLANT
KIT PINK PAD W/HEAD ARE REST (MISCELLANEOUS) ×3
KIT PINK PAD W/HEAD ARM REST (MISCELLANEOUS) ×2 IMPLANT
KIT TURNOVER CYSTO (KITS) ×3 IMPLANT
LABEL OR SOLS (LABEL) ×3 IMPLANT
MANIFOLD NEPTUNE II (INSTRUMENTS) ×3 IMPLANT
MESH BIO-A 7X10 SYN MAT (Mesh General) ×3 IMPLANT
NEEDLE HYPO 22GX1.5 SAFETY (NEEDLE) ×3 IMPLANT
OBTURATOR OPTICAL STANDARD 8MM (TROCAR) ×1
OBTURATOR OPTICAL STND 8 DVNC (TROCAR) ×2
OBTURATOR OPTICALSTD 8 DVNC (TROCAR) ×2 IMPLANT
PACK LAP CHOLECYSTECTOMY (MISCELLANEOUS) ×3 IMPLANT
PENCIL ELECTRO HAND CTR (MISCELLANEOUS) ×3 IMPLANT
SEAL CANN UNIV 5-8 DVNC XI (MISCELLANEOUS) ×6 IMPLANT
SEAL XI 5MM-8MM UNIVERSAL (MISCELLANEOUS) ×3
SEALER VESSEL DA VINCI XI (MISCELLANEOUS) ×1
SEALER VESSEL EXT DVNC XI (MISCELLANEOUS) ×2 IMPLANT
SOLUTION ELECTROLUBE (MISCELLANEOUS) ×3 IMPLANT
SPONGE T-LAP 18X18 ~~LOC~~+RFID (SPONGE) ×3 IMPLANT
STAPLER CANNULA SEAL DVNC XI (STAPLE) ×2 IMPLANT
STAPLER CANNULA SEAL XI (STAPLE) ×1
SUT MNCRL 4-0 (SUTURE) ×2
SUT MNCRL 4-0 27XMFL (SUTURE) ×4
SUT SILK 2 0 SH (SUTURE) ×9 IMPLANT
SUT VICRYL 0 AB UR-6 (SUTURE) ×6 IMPLANT
SUT VLOC 90 S/L VL9 GS22 (SUTURE) ×3 IMPLANT
SUTURE MNCRL 4-0 27XMF (SUTURE) ×4 IMPLANT
SYR 20ML LL LF (SYRINGE) ×3 IMPLANT
SYR 30ML LL (SYRINGE) ×3 IMPLANT
TAPE TRANSPORE STRL 2 31045 (GAUZE/BANDAGES/DRESSINGS) ×3 IMPLANT
TRAY FOLEY SLVR 16FR LF STAT (SET/KITS/TRAYS/PACK) ×3 IMPLANT
TROCAR BALLN GELPORT 12X130M (ENDOMECHANICALS) ×3 IMPLANT
TROCAR XCEL NON-BLD 5MMX100MML (ENDOMECHANICALS) ×3 IMPLANT
TUBING EVAC SMOKE HEATED PNEUM (TUBING) ×3 IMPLANT

## 2020-10-29 NOTE — Op Note (Signed)
Robotic assisted laparoscopic REPAIR PARAESOPHAGEAL Hernia with Mesh and Nissen fundoplication   Pre-operative Diagnosis: Symptomatic type III paraesophageal hernia  Post-operative Diagnosis: same  Procedure:  Robotic assisted laparoscopic REPAIR PARAESOPHAGEAL Hernia with Mesh, Nissen fundoplication   Surgeon: Caroleen Hamman, MD FACS  Assistant: Floyce Stakes RNFA. Required due to the complexity of the case the need for exposure and lack of first assist.  Anesthesia: Gen. with endotracheal tube  Findings: Type III paraesophageal hernia Loose wrap 360 degree over 50 FR Bougie  Estimated Blood Loss: 50DT            Complications: none   Procedure Details  The patient was seen again in the Holding Room. The benefits, complications, treatment options, and expected outcomes were discussed with the patient. The risks of bleeding, infection, recurrence of symptoms, failure to resolve symptoms,  esophageal damage, Dysphagia, bowel injury, any of which could require further surgery were reviewed with the patient. The likelihood of improving the patient's symptoms with return to their baseline status is good.  The patient and/or family concurred with the proposed plan, giving informed consent.  The patient was taken to Operating Room, identified  and the procedure verified.  A Time Out was held and the above information confirmed.  Prior to the induction of general anesthesia, antibiotic prophylaxis was administered. VTE prophylaxis was in place. General endotracheal anesthesia was then administered and tolerated well. After the induction, the abdomen was prepped with Chloraprep and draped in the sterile fashion. The patient was positioned in the supine position.  Cut down technique was used to enter the abdominal cavity and a Hasson trochar was placed after two vicryl stitches were anchored to the fascia. Pneumoperitoneum was then created with CO2 and tolerated well without any adverse changes  in the patient's vital signs.  Three 8-mm ports were placed under direct vision. All skin incisions  were infiltrated with a local anesthetic agent before making the incision and placing the trocars. An additional 5 mm regular laparoscopic port was placed to assist with retraction and exposure.   The patient was positioned  in reverse Trendelenburg, robot was brought to the surgical field and docked in the standard fashion.  We made sure all the instrumentation was kept indirect view at all times and that there were no collision between the arms. I scrubbed out and went to the console.  I used a attic arm to retract the liver, the vessel sealer on my right hand and a forced bipolar grasper on my left hand.  There is along the extra 5 mm port allow me ample exposure and the ability to perform meticulous dissection  We Started dividing the lesser omentum via the pars flaccida.  We Were able to dissect the lesser curvature of the stomach and  dissected the fundus free from the right and left crus.  We circumferentially dissected the GE junction.  The hernia sac was also completely reduced and we were able to bring the stomach into the intra-abdominal position.  Attention then was turned to the greater curvature where the short gastrics were divided with sealer device.  We were able to identify the left crus and again were able to make sure there was a good circumferential dissection and that the hernia sac was completely excised.  We did perform also a good dissection within the mediastinum to allow a complete reduction of the sac and a to completely allow an intra-abdominal Nissen fundoplication.  2-0V lock suture was inserted and the crus as well  as the hernia was closed with a running suture using BioA strips as pledges. !0x 7 cms Bio-A mesh placed posteriro to the esophageus and secured with Vistaseal.   We Asked anesthesia to place a 50 French bougie and this went easily.  We also observe trajectory of  the bougie. 360 degree Nissen fundoplication was created with multiple 2-0 silk sutures and we placed 3 stitches taking some of the esophagus within that bite.  The fundoplication measured approximately 3-1/2 cm and he was floppy. I was very happy with the way the fundoplication laid and the repair of the hernia.   Inspection of the  upper quadrant was performed. No bleeding, bile  Or esophageal injuries leaks, or bowel injuries were noted. Robotic instruments and robotic arms were undocked in the standard fashion. All the needles were removed under direct visualization.   I scrubbed back in.  Pneumoperitoneum was released.  The periumbilical port site was closed with interrumpted 0 Vicryl sutures. 4-0 subcuticular Monocryl was used to close the skin. Liposomal marcaine was injected to all the incisions sites.  Dermabond was  applied.  The patient was then extubated and brought to the recovery room in stable condition. Sponge, lap, and needle counts were correct at closure and at the conclusion of the case.               Caroleen Hamman, MD, FACS

## 2020-10-29 NOTE — Anesthesia Postprocedure Evaluation (Signed)
Anesthesia Post Note  Patient: Tracey Perez  Procedure(s) Performed: XI ROBOTIC ASSISTED HIATAL HERNIA REPAIR, RNFA to assist  Patient location during evaluation: PACU Anesthesia Type: General Level of consciousness: awake and alert, awake and oriented Pain management: pain level controlled Vital Signs Assessment: post-procedure vital signs reviewed and stable Respiratory status: spontaneous breathing, nonlabored ventilation and respiratory function stable Cardiovascular status: blood pressure returned to baseline and stable Postop Assessment: no apparent nausea or vomiting Anesthetic complications: no   No notable events documented.   Last Vitals:  Vitals:   10/29/20 1200 10/29/20 1210  BP: 129/86 129/83  Pulse: (!) 101 99  Resp: 19 20  Temp:    SpO2: 94% 93%    Last Pain:  Vitals:   10/29/20 1210  TempSrc:   PainSc: 8                  Phill Mutter

## 2020-10-29 NOTE — Anesthesia Procedure Notes (Signed)
Procedure Name: Intubation Date/Time: 10/29/2020 7:41 AM Performed by: Zetta Bills, CRNA Pre-anesthesia Checklist: Patient identified, Emergency Drugs available, Suction available, Patient being monitored and Timeout performed Patient Re-evaluated:Patient Re-evaluated prior to induction Oxygen Delivery Method: Circle system utilized Preoxygenation: Pre-oxygenation with 100% oxygen Induction Type: IV induction Ventilation: Mask ventilation without difficulty Laryngoscope Size: McGraph and 3 Grade View: Grade I Tube type: Oral Tube size: 7.0 mm Number of attempts: 1 Airway Equipment and Method: Stylet Placement Confirmation: ETT inserted through vocal cords under direct vision Secured at: 21 cm Tube secured with: Tape Dental Injury: Teeth and Oropharynx as per pre-operative assessment

## 2020-10-29 NOTE — Transfer of Care (Signed)
Immediate Anesthesia Transfer of Care Note  Patient: Tracey Perez  Procedure(s) Performed: XI ROBOTIC ASSISTED HIATAL HERNIA REPAIR, RNFA to assist  Patient Location: PACU  Anesthesia Type:General  Level of Consciousness: awake  Airway & Oxygen Therapy: Patient Spontanous Breathing  Post-op Assessment: Report given to RN  Post vital signs: stable  Last Vitals:  Vitals Value Taken Time  BP 151/80 10/29/20 1033  Temp    Pulse 106 10/29/20 1038  Resp 17 10/29/20 1038  SpO2 96 % 10/29/20 1038  Vitals shown include unvalidated device data.  Last Pain:  Vitals:   10/29/20 0626  TempSrc: Oral  PainSc: 0-No pain         Complications: No notable events documented.

## 2020-10-29 NOTE — Anesthesia Preprocedure Evaluation (Signed)
Anesthesia Evaluation  Patient identified by MRN, date of birth, ID band Patient awake    Reviewed: Allergy & Precautions, H&P , NPO status , Patient's Chart, lab work & pertinent test results, reviewed documented beta blocker date and time   History of Anesthesia Complications (+) PONV and history of anesthetic complications  Airway Mallampati: II  TM Distance: >3 FB Neck ROM: full    Dental  (+) Dental Advidsory Given, Caps, Teeth Intact   Pulmonary neg shortness of breath, sleep apnea , neg COPD, neg recent URI, former smoker, PE (in the last 2 years)   Pulmonary exam normal breath sounds clear to auscultation       Cardiovascular Exercise Tolerance: Good hypertension, (-) angina+ DVT  (-) Past MI and (-) Cardiac Stents Normal cardiovascular exam(-) dysrhythmias (-) Valvular Problems/Murmurs Rhythm:regular Rate:Normal     Neuro/Psych  Headaches, neg Seizures PSYCHIATRIC DISORDERS Anxiety Depression  Neuromuscular disease    GI/Hepatic Neg liver ROS, hiatal hernia, GERD  ,  Endo/Other  neg diabetesHypothyroidism   Renal/GU negative Renal ROS  negative genitourinary   Musculoskeletal  (+) Arthritis ,   Abdominal   Peds  Hematology negative hematology ROS (+) anemia ,   Anesthesia Other Findings Anemia Ferritin anemia   Anxiety    Arthritis    Complication of anesthesia   Depression    DVT (deep vein thrombosis) in pregnany GERD (gastroesophageal reflux disease) Headache  chronic migraines History of 2019 novel coronavirus disease (COVID-19) 10/12/2020 Repeat test on 10/15/2020 as patient did not believe result; result (+)  History of hiatal hernia    Hypertension    Hypothyroidism    PONV (postoperative nausea and vomiting)    Pulmonary embolism (HCC)  hx of last one 10/20 hospitalized for one day at [facility] per patient followed by Dr Janese Banks Sleep apnea  does not uses cpap        Reproductive/Obstetrics negative OB ROS                            Anesthesia Physical  Anesthesia Plan  ASA: 3  Anesthesia Plan: General   Post-op Pain Management:    Induction: Intravenous  PONV Risk Score and Plan: 4 or greater and Propofol infusion, Scopolamine patch - Pre-op, Midazolam, Ondansetron and Treatment may vary due to age or medical condition  Airway Management Planned: Oral ETT  Additional Equipment:   Intra-op Plan:   Post-operative Plan: Extubation in OR  Informed Consent: I have reviewed the patients History and Physical, chart, labs and discussed the procedure including the risks, benefits and alternatives for the proposed anesthesia with the patient or authorized representative who has indicated his/her understanding and acceptance.     Dental Advisory Given  Plan Discussed with: Anesthesiologist, CRNA and Surgeon  Anesthesia Plan Comments:        Anesthesia Quick Evaluation

## 2020-10-29 NOTE — Interval H&P Note (Signed)
History and Physical Interval Note:  10/29/2020 7:22 AM  [name]  has presented today for surgery, with the diagnosis of hiatal hernia.  The various methods of treatment have been discussed with the patient and family. After consideration of risks, benefits and other options for treatment, the patient has consented to  Procedure(s): XI ROBOTIC [place], RNFA to assist (N/A) as a surgical intervention.  The patient's history has been reviewed, patient examined, no change in status, stable for surgery.  I have reviewed the patient's chart and labs.  Questions were answered to the patient's satisfaction.     [place]

## 2020-10-29 NOTE — Discharge Instructions (Addendum)
In addition to included general post-operative instructions,  Diet: Resume home diet. Follow Nissen diet recommendations x4 weeks; hand out was provided.   Activity: No heavy lifting >20 pounds (children, pets, laundry, garbage) or strenuous activity for 4 weeks, but light activity and walking are encouraged. Do not drive or drink alcohol if taking narcotic pain medications or having pain that might distract from driving.  Wound care: 2 days after surgery (07/13), you may shower/get incision wet with soapy water and pat dry (do not rub incisions), but no baths or submerging incision underwater until follow-up.   Medications: Resume all home medications. For mild to moderate pain: acetaminophen (Tylenol) or ibuprofen/naproxen (if no kidney disease). Combining Tylenol with alcohol can substantially increase your risk of causing liver disease. Narcotic pain medications, if prescribed, can be used for severe pain, though may cause nausea, constipation, and drowsiness. Do not combine Tylenol and Percocet (or similar) within a 6 hour period as Percocet (and similar) contain(s) Tylenol. If you do not need the narcotic pain medication, you do not need to fill the prescription.  Call office [phone] / [phone]) at any time if any questions, worsening pain, fevers/chills, bleeding, drainage from incision site, or other concerns.

## 2020-10-30 ENCOUNTER — Encounter: Payer: Self-pay | Admitting: Surgery

## 2020-10-30 LAB — CBC
HCT: 32.8 % — ABNORMAL LOW (ref 36.0–46.0)
Hemoglobin: 11 g/dL — ABNORMAL LOW (ref 12.0–15.0)
MCH: 28.8 pg (ref 26.0–34.0)
MCHC: 33.5 g/dL (ref 30.0–36.0)
MCV: 85.9 fL (ref 80.0–100.0)
Platelets: 267 10*3/uL (ref 150–400)
RBC: 3.82 MIL/uL — ABNORMAL LOW (ref 3.87–5.11)
RDW: 13.9 % (ref 11.5–15.5)
WBC: 8.8 10*3/uL (ref 4.0–10.5)
nRBC: 0 % (ref 0.0–0.2)

## 2020-10-30 LAB — BASIC METABOLIC PANEL
Anion gap: 4 — ABNORMAL LOW (ref 5–15)
BUN: 15 mg/dL (ref 6–20)
CO2: 27 mmol/L (ref 22–32)
Calcium: 9.2 mg/dL (ref 8.9–10.3)
Chloride: 107 mmol/L (ref 98–111)
Creatinine, Ser: 0.86 mg/dL (ref 0.44–1.00)
GFR, Estimated: >60 mL/min (ref >60)
Glucose, Bld: 103 mg/dL — ABNORMAL HIGH (ref 70–99)
Potassium: 4.8 mmol/L (ref 3.5–5.1)
Sodium: 138 mmol/L (ref 135–145)

## 2020-10-30 MED ORDER — OXYCODONE HCL 5 MG PO TABS: 5.0000 mg | ORAL_TABLET | Freq: Four times a day (QID) | ORAL | 0 refills | Status: DC | PRN

## 2020-10-30 NOTE — Discharge Summary (Signed)
[facility] SURGICAL DISCHARGE SUMMARY  Patient ID: Tracey Perez MRN: [id number] DOB/AGE: [birth year] 56 y.o.  Admit date: 10/29/2020 Discharge date: 10/30/2020  Discharge Diagnoses Patient Active Problem List   Diagnosis Date Noted   S/P repair of paraesophageal hernia 10/29/2020   Hiatal hernia 08/30/2020    Consultants None  Procedures 10/29/2020:  Robotic assisted laparoscopic paraesophageal hernia repair with mesh with Nissen fundoplication   HPI: Tracey Perez is a 55 y.o. female with a history of symptomatic paraesophageal hernia, type III, who presents to [facility] on 07/11 for scheduled repair with Dr Dahlia Byes.   Hospital Course: Informed consent was obtained and documented, and patient underwent uneventful Robotic assisted laparoscopic paraesophageal hernia repair with mesh with Nissen fundoplication  (Dr Dahlia Byes, 10/29/2020).  Post-operatively, patient did very well. Advancement of patient's diet and ambulation were well-tolerated. The remainder of patient's hospital course was essentially unremarkable, and discharge planning was initiated accordingly with patient safely able to be discharged home with appropriate discharge instructions, pain control, and outpatient follow-up after all of her questions were answered to her expressed satisfaction.   Discharge Condition: Good   Physical Examination:  Constitutional: Well appearing female, NAD Pulmonary: Normal effort, no respiratory distress Gastrointestinal: Soft, non-tender, non-distended, no rebound/guarding Skin: Laparoscopic incisions are CDI with dermabond, no erythema or drainage    Allergies as of 10/30/2020       Reactions   Morphine And Related Hives   Nsaids Other (See Comments)   Affects platelet count   Sulfa Antibiotics Shortness Of Breath   Tape Other (See Comments)   bruises skin   Verapamil Swelling   Lisinopril Other (See Comments)   Burning throat, metallic taste   Other Itching    Pt states she is allergic to the plastic that IV bags are made of. Reaction causes itching relieved by Benadryl.    Acetazolamide Other (See Comments)   Acid reflux   Seroquel [quetiapine Fumarate] Other (See Comments)   Muscle cramps        Medication List     STOP taking these medications    omeprazole 40 MG capsule Commonly known as: PRILOSEC       TAKE these medications    Blood Pressure Kit Kit 1 kit by Does not apply route 2 (two) times daily.   diphenhydrAMINE 25 mg capsule Commonly known as: BENADRYL Take 50 mg by mouth 4 (four) times daily as needed for allergies or sleep.   Eliquis 5 MG Tabs tablet Generic drug: apixaban Take 5 mg by mouth 2 (two) times daily.   enoxaparin 40 MG/0.4ML injection Commonly known as: LOVENOX Inject 0.4 mLs (40 mg total) into the skin daily. Take initial dose on 10/09/20, then hold dose 10/15/20, restart after surgery and take daily for 7 days post surgery.  Then stop and restart Apixaban.   Melatonin 10 MG Tabs Take 10 mg by mouth at bedtime.   oxycodone 30 MG immediate release tablet Commonly known as: ROXICODONE Take 30 mg by mouth 5 (five) times daily. What changed: Another medication with the same name was added. Make sure you understand how and when to take each.   oxyCODONE 5 MG immediate release tablet Commonly known as: Oxy IR/ROXICODONE Take 1 tablet (5 mg total) by mouth every 6 (six) hours as needed for severe pain or breakthrough pain. What changed: You were already taking a medication with the same name, and this prescription was added. Make sure you understand how and when to take each.  potassium chloride SA 20 MEQ tablet Commonly known as: KLOR-CON Take 2 tablets (40 mEq total) by mouth 2 (two) times daily.   venlafaxine XR 150 MG 24 hr capsule Commonly known as: EFFEXOR-XR Take 300 mg by mouth in the morning.   VITAMIN C PO Take 1 tablet by mouth daily in the afternoon.   VITAMIN D3 PO Take 2  tablets by mouth daily in the afternoon.   zolpidem 10 MG tablet Commonly known as: AMBIEN Take 10 mg by mouth at bedtime.       ASK your doctor about these medications    gabapentin 400 MG capsule Commonly known as: NEURONTIN TAKE ONE CAPSULE BY MOUTH FOUR TIMES A DAY   hydrALAZINE 25 MG tablet Commonly known as: APRESOLINE TAKE ONE TABLET BY MOUTH EVERY DAY   levothyroxine 75 MCG tablet Commonly known as: SYNTHROID TAKE ONE TABLET BY MOUTH EVERY DAY BEFORE BREAKFAST   mirtazapine 15 MG tablet Commonly known as: REMERON TAKE ONE TABLET BY MOUTH AT BEDTIME   tiZANidine 4 MG tablet Commonly known as: ZANAFLEX TAKE 4 TABLETS BY MOUTH AT BEDTIME          Follow-up Information     Jules Husbands, MD. Go on 11/14/2020.   Specialty: General Surgery Why: s/p laparoscopic NIssen fundoplication 6:19TJ appointment Contact information: [address] [place] [address] [phone]                  Time spent on discharge management including discussion of hospital course, clinical condition, outpatient instructions, prescriptions, and follow up with the patient and members of the medical team: >30 minutes  -- Edison Simon , PA-C [facility]  10/30/2020, 9:24 AM [phone] M-F: 7am - 4pm

## 2020-11-14 ENCOUNTER — Encounter: Payer: BC Managed Care – PPO | Admitting: Surgery

## 2020-11-19 ENCOUNTER — Ambulatory Visit (INDEPENDENT_AMBULATORY_CARE_PROVIDER_SITE_OTHER): Payer: BC Managed Care – PPO | Admitting: Surgery

## 2020-11-19 ENCOUNTER — Other Ambulatory Visit: Payer: Self-pay

## 2020-11-19 ENCOUNTER — Encounter: Payer: Self-pay | Admitting: Surgery

## 2020-11-19 VITALS — BP 116/83 | HR 92 | Temp 98.5°F | Ht 64.0 in | Wt 149.0 lb

## 2020-11-19 DIAGNOSIS — Z09 Encounter for follow-up examination after completed treatment for conditions other than malignant neoplasm: Secondary | ICD-10-CM

## 2020-11-19 NOTE — Progress Notes (Signed)
Tyshai is 2 weeks out from a paraesophageal hernia repair.  She is doing well.  No fevers no chills.  She is tolerating soft diet.  No dysphagia no reflux  PE NAD Abd: soft incisions healing well, no infection  A/P doing very well w/o complications, continue Nissen diet F/U 2 months

## 2020-11-19 NOTE — Patient Instructions (Signed)
No carbonated beverages at this time. Please call our office if you have any questions or concerns. You may resume your normal activities on 12/10/20. We will complete your Disability paperwork-return to work 12/10/20 with lifting restrictions -no greater than 25 pounds until 12/31/20

## 2020-11-21 ENCOUNTER — Telehealth: Payer: Self-pay | Admitting: *Deleted

## 2020-11-21 NOTE — Telephone Encounter (Signed)
Faxed FMLA to [employer] and [employer] at [phone]

## 2020-11-26 ENCOUNTER — Telehealth: Payer: Self-pay | Admitting: *Deleted

## 2020-11-26 NOTE — Telephone Encounter (Signed)
Faxed FMLA to Rona Ravens at [facility] at [phone]

## 2020-12-26 ENCOUNTER — Ambulatory Visit (INDEPENDENT_AMBULATORY_CARE_PROVIDER_SITE_OTHER): Payer: BC Managed Care – PPO | Admitting: Surgery

## 2020-12-26 ENCOUNTER — Encounter: Payer: Self-pay | Admitting: Surgery

## 2020-12-26 ENCOUNTER — Other Ambulatory Visit: Payer: Self-pay

## 2020-12-26 VITALS — BP 122/88 | HR 118 | Temp 98.8°F | Ht 64.0 in | Wt 142.6 lb

## 2020-12-26 DIAGNOSIS — R1010 Upper abdominal pain, unspecified: Secondary | ICD-10-CM

## 2020-12-26 MED ORDER — CYCLOBENZAPRINE HCL 5 MG PO TABS: 5.0000 mg | ORAL_TABLET | Freq: Three times a day (TID) | ORAL | 0 refills | Status: DC | PRN

## 2020-12-26 NOTE — Patient Instructions (Addendum)
Your CT is scheduled for Friday Sept. 9th at 9:30 am (arrive by 9:15 am) @ [facility] [address]. Nothing to eat or drink 4 hours prior. Please pick up contrast today or tomorrow.   Please pick up your prescription at your pharmacy.   If you have any concerns or questions, please feel free to call our office.

## 2020-12-28 ENCOUNTER — Other Ambulatory Visit: Payer: Self-pay

## 2020-12-28 ENCOUNTER — Encounter: Payer: Self-pay | Admitting: Surgery

## 2020-12-28 ENCOUNTER — Ambulatory Visit
Admission: RE | Admit: 2020-12-28 | Discharge: 2020-12-28 | Disposition: A | Discharge status: Home / Self Care | Payer: BC Managed Care – PPO | Source: Ambulatory Visit | Attending: Surgery | Admitting: Surgery

## 2020-12-28 DIAGNOSIS — R1010 Upper abdominal pain, unspecified: Secondary | ICD-10-CM | POA: Diagnosis not present

## 2020-12-28 DIAGNOSIS — R109 Unspecified abdominal pain: Secondary | ICD-10-CM | POA: Diagnosis not present

## 2020-12-28 LAB — POCT I-STAT CREATININE: Creatinine, Ser: 1 mg/dL (ref 0.44–1.00)

## 2020-12-28 MED ORDER — IOHEXOL 300 MG/ML  SOLN
100.0000 mL | Freq: Once | INTRAMUSCULAR | Status: AC | PRN
  Administered 2020-12-28: 100 mL via INTRAVENOUS

## 2020-12-28 NOTE — Progress Notes (Signed)
S/p rob paraesophageal 7 weeks ago She has been back to work and had to carry [employer] and pans Since then upper abd pain No fevers or chills, taking po  PE: NAD [id number], mild tenderness epigastric region no rebound, incisions c/d/I, no infection  A/P ? MSK pain. To make sure there are no complications related to her surgery we will obtain ct a/p and we will see her after w/u is completed May do Flexeril prn

## 2020-12-31 ENCOUNTER — Ambulatory Visit: Payer: Self-pay | Admitting: "Endocrinology

## 2020-12-31 ENCOUNTER — Telehealth: Payer: Self-pay

## 2020-12-31 NOTE — Telephone Encounter (Signed)
Mail box full-unable to leave message . Please let patient know the below information when she calls.   Per Dr.Pabon -CT looks good -hernia is fixed, follow up as scheduled with Dr.Pabon.

## 2021-01-02 DIAGNOSIS — Z79899 Other long term (current) drug therapy: Secondary | ICD-10-CM | POA: Diagnosis not present

## 2021-01-02 DIAGNOSIS — G894 Chronic pain syndrome: Secondary | ICD-10-CM | POA: Diagnosis not present

## 2021-01-02 DIAGNOSIS — M5416 Radiculopathy, lumbar region: Secondary | ICD-10-CM | POA: Diagnosis not present

## 2021-01-02 DIAGNOSIS — F418 Other specified anxiety disorders: Secondary | ICD-10-CM | POA: Diagnosis not present

## 2021-01-14 ENCOUNTER — Encounter: Payer: BC Managed Care – PPO | Admitting: Surgery

## 2021-01-22 DIAGNOSIS — M5416 Radiculopathy, lumbar region: Secondary | ICD-10-CM | POA: Diagnosis not present

## 2021-01-31 ENCOUNTER — Other Ambulatory Visit: Payer: Self-pay | Admitting: Gerontology

## 2021-01-31 DIAGNOSIS — I10 Essential (primary) hypertension: Secondary | ICD-10-CM

## 2021-02-01 DIAGNOSIS — E890 Postprocedural hypothyroidism: Secondary | ICD-10-CM

## 2021-02-01 MED ORDER — LEVOTHYROXINE SODIUM 75 MCG PO TABS: ORAL_TABLET | ORAL | 4 refills | Status: DC

## 2021-03-06 DIAGNOSIS — M5416 Radiculopathy, lumbar region: Secondary | ICD-10-CM | POA: Diagnosis not present

## 2021-03-06 DIAGNOSIS — Z79899 Other long term (current) drug therapy: Secondary | ICD-10-CM | POA: Diagnosis not present

## 2021-03-06 DIAGNOSIS — G894 Chronic pain syndrome: Secondary | ICD-10-CM | POA: Diagnosis not present

## 2021-03-06 DIAGNOSIS — F418 Other specified anxiety disorders: Secondary | ICD-10-CM | POA: Diagnosis not present

## 2021-03-07 ENCOUNTER — Ambulatory Visit: Payer: BC Managed Care – PPO | Admitting: Nurse Practitioner

## 2021-03-07 ENCOUNTER — Encounter: Payer: Self-pay | Admitting: Nurse Practitioner

## 2021-03-07 ENCOUNTER — Other Ambulatory Visit: Payer: Self-pay

## 2021-03-07 VITALS — BP 106/70 | HR 81 | Wt 133.4 lb

## 2021-03-07 DIAGNOSIS — Z1159 Encounter for screening for other viral diseases: Secondary | ICD-10-CM | POA: Diagnosis not present

## 2021-03-07 DIAGNOSIS — D6869 Other thrombophilia: Secondary | ICD-10-CM

## 2021-03-07 DIAGNOSIS — Z8719 Personal history of other diseases of the digestive system: Secondary | ICD-10-CM

## 2021-03-07 DIAGNOSIS — G894 Chronic pain syndrome: Secondary | ICD-10-CM

## 2021-03-07 DIAGNOSIS — E039 Hypothyroidism, unspecified: Secondary | ICD-10-CM

## 2021-03-07 DIAGNOSIS — I10 Essential (primary) hypertension: Secondary | ICD-10-CM

## 2021-03-07 DIAGNOSIS — Z23 Encounter for immunization: Secondary | ICD-10-CM

## 2021-03-07 DIAGNOSIS — Z86718 Personal history of other venous thrombosis and embolism: Secondary | ICD-10-CM

## 2021-03-07 DIAGNOSIS — Z86711 Personal history of pulmonary embolism: Secondary | ICD-10-CM

## 2021-03-07 DIAGNOSIS — E538 Deficiency of other specified B group vitamins: Secondary | ICD-10-CM | POA: Diagnosis not present

## 2021-03-07 DIAGNOSIS — E559 Vitamin D deficiency, unspecified: Secondary | ICD-10-CM

## 2021-03-07 DIAGNOSIS — Z9889 Other specified postprocedural states: Secondary | ICD-10-CM

## 2021-03-07 DIAGNOSIS — F321 Major depressive disorder, single episode, moderate: Secondary | ICD-10-CM

## 2021-03-07 DIAGNOSIS — R7303 Prediabetes: Secondary | ICD-10-CM

## 2021-03-07 DIAGNOSIS — E890 Postprocedural hypothyroidism: Secondary | ICD-10-CM

## 2021-03-07 DIAGNOSIS — E782 Mixed hyperlipidemia: Secondary | ICD-10-CM

## 2021-03-07 DIAGNOSIS — D508 Other iron deficiency anemias: Secondary | ICD-10-CM

## 2021-03-07 LAB — MICROALBUMIN, URINE WAIVED
Creatinine, Urine Waived: 200 mg/dL (ref 10–300)
Microalb, Ur Waived: 30 mg/L — ABNORMAL HIGH (ref 0–19)
Microalb/Creat Ratio: <30 mg/g (ref <30)

## 2021-03-07 LAB — BAYER DCA HB A1C WAIVED: HB A1C (BAYER DCA - WAIVED): 5.1 % (ref 4.8–5.6)

## 2021-03-07 MED ORDER — APIXABAN 5 MG PO TABS: 5.0000 mg | ORAL_TABLET | Freq: Two times a day (BID) | ORAL | 4 refills | Status: DC

## 2021-03-07 NOTE — Assessment & Plan Note (Signed)
Performed on 10/29/20 and overall stable at this time.

## 2021-03-07 NOTE — Assessment & Plan Note (Signed)
A1c 5.1% today, well below prediabetes level.  Continue current diet and exercise focus.  Urine ALB 30, allergic reaction in ACE in past, could consider ARB trial.

## 2021-03-07 NOTE — Patient Instructions (Signed)

## 2021-03-07 NOTE — Assessment & Plan Note (Signed)
Chronic, ongoing with history of right lobectomy 08/07/2019.  At this time will monitor thyroid levels in office and adjust dose of Levothyroxine as needed.  Labs: TSH and Free T4 today.

## 2021-03-07 NOTE — Assessment & Plan Note (Addendum)
Chronic, ongoing with lifelong need for anticoagulation.  Continue Eliquis and send refills as needed.  Refills sent in, may need CCM referral to assist in future.  Monitor CBC regularly and kidney function.

## 2021-03-07 NOTE — Assessment & Plan Note (Signed)
Chronic, ongoing with history of childhood trauma.  Followed by Dr. Holland Falling at [facility] in [facility] for both psychiatry and pain management.  Continue this collaboration and medication as ordered by him, she is aware PCP does not perform chronic pain management and refills must come from pain management provider for all current psychiatric medications and pain medication.  Agrees with this plan of care.
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
## 2021-03-07 NOTE — Assessment & Plan Note (Signed)
ASCVD 1.8%, continue to monitor labs and focus on diet changes at home.  May benefit from medication in future, will discuss with patient next visit -- dependent on family history and overall CAD risk.

## 2021-03-07 NOTE — Assessment & Plan Note (Signed)
In 2005.  Chronic, ongoing with lifelong need for anticoagulation.  Continue Eliquis and send refills as needed.  Monitor CBC regularly and kidney function.   

## 2021-03-07 NOTE — Assessment & Plan Note (Signed)
Chronic, ongoing, followed by Dr. David Marks at [facility] in [place].  Continue this collaboration and medication as ordered by him, she is aware PCP does not perform chronic pain management and refills must come from pain management provider.   

## 2021-03-07 NOTE — Assessment & Plan Note (Signed)
History of, with recent levels improved.  Will continue to monitor and return to hematology as needed based on labs.  Obtain labs today. 

## 2021-03-07 NOTE — Assessment & Plan Note (Signed)
Chronic, ongoing, taking Hydralazine.  Recommend she monitor BP at least a few mornings a week at home and document.  DASH diet at home.  Continue current medication regimen and adjust as needed -- plan on reducing next visit if continues to have stable BP.  Labs: CBC, CMP, lipid.  Return in 6 months, sooner if low BP at home.

## 2021-03-07 NOTE — Assessment & Plan Note (Signed)
On lifelong Eliquis due to history of bilateral PE and history of DVT.  Monitor CBC regularly and monitor for increased bruising or bleeding.  Return to hematology as needed. 

## 2021-03-08 ENCOUNTER — Encounter: Payer: Self-pay | Admitting: Nurse Practitioner

## 2021-03-08 LAB — CBC WITH DIFFERENTIAL/PLATELET
Basophils Absolute: 0 10*3/uL (ref 0.0–0.2)
Basos: 1 %
EOS (ABSOLUTE): 0.3 10*3/uL (ref 0.0–0.4)
Eos: 6 %
Hematocrit: 39.2 % (ref 34.0–46.6)
Hemoglobin: 12.7 g/dL (ref 11.1–15.9)
Immature Grans (Abs): 0 10*3/uL (ref 0.0–0.1)
Immature Granulocytes: 0 %
Lymphocytes Absolute: 2 10*3/uL (ref 0.7–3.1)
Lymphs: 44 %
MCH: 28.7 pg (ref 26.6–33.0)
MCHC: 32.4 g/dL (ref 31.5–35.7)
MCV: 89 fL (ref 79–97)
Monocytes Absolute: 0.3 10*3/uL (ref 0.1–0.9)
Monocytes: 7 %
Neutrophils Absolute: 1.9 10*3/uL (ref 1.4–7.0)
Neutrophils: 42 %
Platelets: 265 10*3/uL (ref 150–450)
RBC: 4.42 x10E6/uL (ref 3.77–5.28)
RDW: 13.3 % (ref 11.7–15.4)
WBC: 4.5 10*3/uL (ref 3.4–10.8)

## 2021-03-08 LAB — VITAMIN B12: Vitamin B-12: 614 pg/mL (ref 232–1245)

## 2021-03-08 LAB — COMPREHENSIVE METABOLIC PANEL
ALT: 14 IU/L (ref 0–32)
AST: 18 IU/L (ref 0–40)
Albumin/Globulin Ratio: 2.4 — ABNORMAL HIGH (ref 1.2–2.2)
Albumin: 4.4 g/dL (ref 3.8–4.9)
Alkaline Phosphatase: 97 IU/L (ref 44–121)
BUN/Creatinine Ratio: 8 — ABNORMAL LOW (ref 9–23)
BUN: 6 mg/dL (ref 6–24)
Bilirubin Total: <0.2 mg/dL (ref 0.0–1.2)
CO2: 28 mmol/L (ref 20–29)
Calcium: 9.7 mg/dL (ref 8.7–10.2)
Chloride: 100 mmol/L (ref 96–106)
Creatinine, Ser: 0.73 mg/dL (ref 0.57–1.00)
Globulin, Total: 1.8 g/dL (ref 1.5–4.5)
Glucose: 80 mg/dL (ref 70–99)
Potassium: 3.6 mmol/L (ref 3.5–5.2)
Sodium: 142 mmol/L (ref 134–144)
Total Protein: 6.2 g/dL (ref 6.0–8.5)
eGFR: 96 mL/min/{1.73_m2} (ref >59)

## 2021-03-08 LAB — IRON,TIBC AND FERRITIN PANEL
Ferritin: 340 ng/mL — ABNORMAL HIGH (ref 15–150)
Iron Saturation: 27 % (ref 15–55)
Iron: 68 ug/dL (ref 27–159)
Total Iron Binding Capacity: 250 ug/dL (ref 250–450)
UIBC: 182 ug/dL (ref 131–425)

## 2021-03-08 LAB — T4, FREE: Free T4: 1.42 ng/dL (ref 0.82–1.77)

## 2021-03-08 LAB — LIPID PANEL W/O CHOL/HDL RATIO
Cholesterol, Total: 201 mg/dL — ABNORMAL HIGH (ref 100–199)
HDL: 72 mg/dL (ref >39)
LDL Chol Calc (NIH): 101 mg/dL — ABNORMAL HIGH (ref 0–99)
Triglycerides: 165 mg/dL — ABNORMAL HIGH (ref 0–149)
VLDL Cholesterol Cal: 28 mg/dL (ref 5–40)

## 2021-03-08 LAB — VITAMIN D 25 HYDROXY (VIT D DEFICIENCY, FRACTURES): Vit D, 25-Hydroxy: 46.6 ng/mL (ref 30.0–100.0)

## 2021-03-08 LAB — HEPATITIS C ANTIBODY: Hep C Virus Ab: <0.1 s/co ratio (ref 0.0–0.9)

## 2021-03-08 LAB — TSH: TSH: 0.713 u[IU]/mL (ref 0.450–4.500)

## 2021-03-08 NOTE — Progress Notes (Signed)
Contacted via MyChart The 10-year ASCVD risk score (Arnett DK, et al., 2019) is: 1.2%   Values used to calculate the score:     Age: 55 years     Sex: Female     Is Non-Hispanic African American: No     Diabetic: No     Tobacco smoker: No     Systolic Blood Pressure: 161 mmHg     Is BP treated: No     HDL Cholesterol: 72 mg/dL     Total Cholesterol: 201 mg/dL   Good evening Tracey Perez, your labs have returned: - Iron level has improved from last check 8 months ago, continue any supplements. - CBC shows improvement in hemoglobin and hematocrit from previous, these are in normal range. - Kidney function, creatinine and eGFR, is normal.  Liver function, AST and ALT, is normal.   - Thyroid levels are normal, continue current dosing. - Vit D and B12 are normal - Hep C is negative - Your cholesterol is still high, but continued recommendations to make lifestyle changes. Your LDL is above normal. The LDL is the bad cholesterol. Over time and in combination with inflammation and other factors, this contributes to plaque which in turn may lead to stroke and/or heart attack down the road. Sometimes high LDL is primarily genetic, and people might be eating all the right foods but still have high numbers. Other times, there is room for improvement in one's diet and eating healthier can bring this number down and potentially reduce one's risk of heart attack and/or stroke.   To reduce your LDL, Remember - more fruits and vegetables, more fish, and limit red meat and dairy products. More soy, nuts, beans, barley, lentils, oats and plant sterol ester enriched margarine instead of butter. I also encourage eliminating sugar and processed food. Remember, shop on the outside of the grocery store and visit your [employer]. If you would like to talk with me about dietary changes plus or minus medications for your cholesterol, please let me know. We should recheck your cholesterol in 6 months.  Any  questions? Keep being stellar!!  Thank you for allowing me to participate in your care.  I appreciate you. Kindest regards, Jailee Jaquez

## 2021-04-18 ENCOUNTER — Encounter: Payer: Self-pay | Admitting: Nurse Practitioner

## 2021-04-23 ENCOUNTER — Ambulatory Visit (INDEPENDENT_AMBULATORY_CARE_PROVIDER_SITE_OTHER): Payer: BC Managed Care – PPO | Admitting: Nurse Practitioner

## 2021-04-23 ENCOUNTER — Other Ambulatory Visit: Payer: Self-pay

## 2021-04-23 ENCOUNTER — Encounter: Payer: Self-pay | Admitting: Nurse Practitioner

## 2021-04-23 VITALS — BP 115/81 | HR 89 | Temp 99.2°F | Wt 127.0 lb

## 2021-04-23 DIAGNOSIS — Z86718 Personal history of other venous thrombosis and embolism: Secondary | ICD-10-CM | POA: Diagnosis not present

## 2021-04-23 DIAGNOSIS — Z289 Immunization not carried out for unspecified reason: Secondary | ICD-10-CM | POA: Diagnosis not present

## 2021-04-23 DIAGNOSIS — Z86711 Personal history of pulmonary embolism: Secondary | ICD-10-CM | POA: Diagnosis not present

## 2021-04-23 NOTE — Assessment & Plan Note (Signed)
Concern for blood clot with vaccination, on review of current data less chance for this with Moderna and Pfizer vaccinations -- at this time will write note to [facility] to defer Covid vaccines at this time due to concerns and patient significant history.

## 2021-04-23 NOTE — Assessment & Plan Note (Signed)
In 2005.  Chronic, ongoing with lifelong need for anticoagulation.  Continue Eliquis and send refills as needed.  Monitor CBC regularly and kidney function.   

## 2021-04-23 NOTE — Progress Notes (Signed)
BP 115/81    Pulse 89    Temp 99.2 F (37.3 C) (Oral)    Wt 127 lb (57.6 kg)    SpO2 99%    BMI 21.80 kg/m    Subjective:    Patient ID: Tracey Perez, female    DOB: October 18, 1965, 56 y.o.   MRN: [id number]  HPI: Tracey Perez is a 56 y.o. female  Chief Complaint  Patient presents with   Covid Vaccine     Pt states she has applied to a new job that requires her to have the covid vaccines or a letter stating why she cannot have them. States she has a hx of blood clots and has been told not to get the covid vaccines.    COVID VACCINATION DISCUSSION: Tracey Perez has history of both DVT and PE in past, currently continues Apixaban.  She is concerned as currently she does not have Covid vaccinations due to history of blood clots being noticed initially with vaccinations, especially J&J, and she has been told she should not obtain these at this time, at the [facility] previous provider told her not to obtain and some of the surgeons she has seen.  Hematology has also advised against vaccines she reports -- she had a full work-up to assess cause of clots.  Last hematology visit was 05/07/20.  She has applied for a work position that requires vaccinations be obtained, to be a Biomedical scientist at [employer].  Relevant past medical, surgical, family and social history reviewed and updated as indicated. Interim medical history since our last visit reviewed. Allergies and medications reviewed and updated.  Review of Systems  Constitutional:  Negative for activity change, appetite change, diaphoresis, fatigue and fever.  Respiratory:  Negative for cough, chest tightness, shortness of breath and wheezing.   Cardiovascular:  Negative for chest pain, palpitations and leg swelling.  Gastrointestinal: Negative.   Neurological: Negative.   Psychiatric/Behavioral: Negative.     Per HPI unless specifically indicated above     Objective:    BP 115/81    Pulse 89    Temp 99.2 F (37.3 C) (Oral)    Wt 127 lb  (57.6 kg)    SpO2 99%    BMI 21.80 kg/m   Wt Readings from Last 3 Encounters:  04/23/21 127 lb (57.6 kg)  03/07/21 133 lb 6.4 oz (60.5 kg)  12/26/20 142 lb 9.6 oz (64.7 kg)    Physical Exam Vitals and nursing note reviewed.  Constitutional:      General: She is awake. She is not in acute distress.    Appearance: She is well-developed and well-groomed. She is not ill-appearing or toxic-appearing.  HENT:     Head: Normocephalic.     Right Ear: Hearing normal.     Left Ear: Hearing normal.  Eyes:     General: Lids are normal.        Right eye: No discharge.        Left eye: No discharge.     Conjunctiva/sclera: Conjunctivae normal.     Pupils: Pupils are equal, round, and reactive to light.  Neck:     Thyroid: No thyromegaly.     Vascular: No carotid bruit.  Cardiovascular:     Rate and Rhythm: Normal rate and regular rhythm.     Heart sounds: Normal heart sounds. No murmur heard.   No gallop.  Pulmonary:     Effort: Pulmonary effort is normal. No accessory muscle usage or respiratory distress.  Breath sounds: Normal breath sounds.  Abdominal:     General: Bowel sounds are normal.     Palpations: Abdomen is soft. There is no hepatomegaly or splenomegaly.  Musculoskeletal:     Cervical back: Normal range of motion and neck supple.     Right lower leg: No edema.     Left lower leg: No edema.  Lymphadenopathy:     Cervical: No cervical adenopathy.  Skin:    General: Skin is warm and dry.  Neurological:     Mental Status: She is alert and oriented to person, place, and time.  Psychiatric:        Attention and Perception: Attention normal.        Mood and Affect: Mood normal.        Speech: Speech normal.        Behavior: Behavior normal. Behavior is cooperative.        Thought Content: Thought content normal.    Results for orders placed or performed in visit on 03/07/21  Bayer DCA Hb A1c Waived  Result Value Ref Range   HB A1C (BAYER DCA - WAIVED) 5.1 4.8 - 5.6 %   Microalbumin, Urine Waived  Result Value Ref Range   Microalb, Ur Waived 30 (H) 0 - 19 mg/L   Creatinine, Urine Waived 200 10 - 300 mg/dL   Microalb/Creat Ratio <30 <30 mg/g  Iron, TIBC and Ferritin Panel  Result Value Ref Range   Total Iron Binding Capacity 250 250 - 450 ug/dL   UIBC 182 131 - 425 ug/dL   Iron 68 27 - 159 ug/dL   Iron Saturation 27 15 - 55 %   Ferritin 340 (H) 15 - 150 ng/mL  CBC with Differential/Platelet  Result Value Ref Range   WBC 4.5 3.4 - 10.8 x10E3/uL   RBC 4.42 3.77 - 5.28 x10E6/uL   Hemoglobin 12.7 11.1 - 15.9 g/dL   Hematocrit 39.2 34.0 - 46.6 %   MCV 89 79 - 97 fL   MCH 28.7 26.6 - 33.0 pg   MCHC 32.4 31.5 - 35.7 g/dL   RDW 13.3 11.7 - 15.4 %   Platelets 265 150 - 450 x10E3/uL   Neutrophils 42 Not Estab. %   Lymphs 44 Not Estab. %   Monocytes 7 Not Estab. %   Eos 6 Not Estab. %   Basos 1 Not Estab. %   Neutrophils Absolute 1.9 1.4 - 7.0 x10E3/uL   Lymphocytes Absolute 2.0 0.7 - 3.1 x10E3/uL   Monocytes Absolute 0.3 0.1 - 0.9 x10E3/uL   EOS (ABSOLUTE) 0.3 0.0 - 0.4 x10E3/uL   Basophils Absolute 0.0 0.0 - 0.2 x10E3/uL   Immature Granulocytes 0 Not Estab. %   Immature Grans (Abs) 0.0 0.0 - 0.1 x10E3/uL  Comprehensive metabolic panel  Result Value Ref Range   Glucose 80 70 - 99 mg/dL   BUN 6 6 - 24 mg/dL   Creatinine, Ser 0.73 0.57 - 1.00 mg/dL   eGFR 96 >59 mL/min/1.73   BUN/Creatinine Ratio 8 (L) 9 - 23   Sodium 142 134 - 144 mmol/L   Potassium 3.6 3.5 - 5.2 mmol/L   Chloride 100 96 - 106 mmol/L   CO2 28 20 - 29 mmol/L   Calcium 9.7 8.7 - 10.2 mg/dL   Total Protein 6.2 6.0 - 8.5 g/dL   Albumin 4.4 3.8 - 4.9 g/dL   Globulin, Total 1.8 1.5 - 4.5 g/dL   Albumin/Globulin Ratio 2.4 (H) 1.2 - 2.2   Bilirubin  Total <0.2 0.0 - 1.2 mg/dL   Alkaline Phosphatase 97 44 - 121 IU/L   AST 18 0 - 40 IU/L   ALT 14 0 - 32 IU/L  Lipid Panel w/o Chol/HDL Ratio  Result Value Ref Range   Cholesterol, Total 201 (H) 100 - 199 mg/dL   Triglycerides 165  (H) 0 - 149 mg/dL   HDL 72 >39 mg/dL   VLDL Cholesterol Cal 28 5 - 40 mg/dL   LDL Chol Calc (NIH) 101 (H) 0 - 99 mg/dL  TSH  Result Value Ref Range   TSH 0.713 0.450 - 4.500 uIU/mL  T4, free  Result Value Ref Range   Free T4 1.42 0.82 - 1.77 ng/dL  VITAMIN D 25 Hydroxy (Vit-D Deficiency, Fractures)  Result Value Ref Range   Vit D, 25-Hydroxy 46.6 30.0 - 100.0 ng/mL  Vitamin B12  Result Value Ref Range   Vitamin B-12 614 232 - 1,245 pg/mL  Hepatitis C antibody  Result Value Ref Range   Hep C Virus Ab <0.1 0.0 - 0.9 s/co ratio      Assessment & Plan:   Problem List Items Addressed This Visit       Other   COVID-19 vaccination not done    Concern for blood clot with vaccination, on review of current data less chance for this with Moderna and Pfizer vaccinations -- at this time will write note to [facility] to defer Covid vaccines at this time due to concerns and patient significant history.      History of DVT (deep vein thrombosis)    In 2005.  Chronic, ongoing with lifelong need for anticoagulation.  Continue Eliquis and send refills as needed.  Monitor CBC regularly and kidney function.        History of pulmonary embolism    Chronic, ongoing with lifelong need for anticoagulation.  Continue Eliquis and send refills as needed.  Refills as needed.  Monitor CBC regularly and kidney function.          Follow up plan: Return for as scheduled upcoming.

## 2021-04-23 NOTE — Patient Instructions (Signed)
Frequently Asked Questions About COVID-19 Vaccination Below are answers to commonly asked questions about COVID-19 vaccination. Have more questions? Visit How to Protect Yourself and Others and FAQs about COVID-19. Boosters Do I need a COVID-19 vaccine booster? Yes. The protection COVID-19 vaccines provide decreases over time, especially for certain groups of people. Recent data suggest their effectiveness at preventing infection or severe illness wanes over time, especially in people ages 74 years and older. The emergence of the variants further emphasizes the importance of vaccination, boosters, and prevention efforts needed to protect against COVID-19. CDC recommends everyone ages 76 and older get a booster for the best protection against COVID-19. Data show that an mRNA booster increases the immune response, which improves protection against getting a serious COVID-19 infection. Learn more about COVID-19 vaccine recommendations, including recommendations for people who are moderately or severely immunocompromised. If we need a booster, are the vaccines working? Yes. COVID-19 vaccines are working well to prevent severe illness, hospitalization, and death. However, public health experts see reduced protection over time against mild and moderate disease, especially among certain populations. Do boosters use the same ingredients as existing vaccines? Yes. COVID-19 boosters are the same ingredients (formulation) as the current COVID-19 vaccines. However, in the case of a Moderna COVID-19 vaccine booster, the dose is half of the amount of the vaccine people get for their primary series. What are the risks to getting a booster? Adults and children may have some side effects from a COVID-19 vaccine, including pain, redness or swelling at the injection site, tiredness, headache, muscle pain, chills, fever, and nausea. Serious side effects are rare, but may occur. Am I still considered "fully vaccinated"  if I don't get a booster? Yes, the definition of fully vaccinated has not changed and does not include a booster. Everyone is still considered fully vaccinated two weeks after their second dose in a two-dose series, such as the Pfizer-BioNTech and Moderna vaccines, or two weeks after the single-dose J&J/Janssen vaccine. Fully vaccinated, however, is not the same as having the best protection. People are best protected when they stay up to date with COVID-19 vaccinations, which includes getting boosters when eligible. Does the definition of "up to date" include a booster? It depends. Everyone ages 60 years and older is considered up to date until the time they are eligible for their first booster -- which is 5 months after the second dose for Pfizer-BioNTech and Moderna vaccines, or two months after the J&J/Janssen vaccine. After this time period, they need to get 1 booster to be considered up to date. Getting a second booster is not necessary to be considered up to date at this time. Learn more about COVID-19 booster recommendations. If I have received a J&J/Janssen COVID-19 vaccine and a J&J/Janssen COVID-19 booster, are additional boosters recommended? People (except those who are moderately or severely immunocompromised) who first received a J&J/Janssen COVID-19 vaccine and got it again for their booster may also receive a booster of an mRNA COVID-19 vaccine (Pfizer-BioNTech or Moderna). Get the mRNA booster at least 4 months after the most recent J&J/Janssen booster. One CDC study found that adults who received the J&J/Janssen COVID-19 vaccine as both their primary and booster had lower levels of protection against COVID-19-associated emergency department and urgent care visits during [place] compared to adults who received an mRNA COVID-19 booster. Getting your vaccine Am I required to get vaccinated for work? An employer may require that their workers be vaccinated. Check directly with your employer  to see if they  have any vaccination requirements or rules that apply to you. How many doses of COVID-19 vaccine will I need to get to complete my primary series? The number of vaccine doses you need to complete your primary series depends on which vaccine you receive. Two doses of Pfizer-BioNTech vaccine 3-8* weeks apart, or Two doses of Moderna vaccine 4-8* weeks apart, or One dose of [employer] (J&J/Janssen) vaccine. *Talk to your healthcare or vaccine provider about the timing for the second dose in your primary series. You should not get the second dose early. People who are moderately or severely immunocompromised may have a different immune response following COVID-19 vaccination. Please see specific COVID-19 vaccination guidance for people who are moderately or severely immunocompromised. If I didn't get my second dose of a 2-dose COVID-19 vaccine within the recommended time, do I need to start over? No. If you receive your second dose of a COVID-19 vaccine at any time after the recommended date, you do not have to restart the vaccine series. This guidance might be updated as more information becomes available. Learn more about staying up to date with your COVID-19 vaccines. How long does protection from a COVID-19 vaccine last? Scientists are monitoring how long COVID-19 vaccine protection lasts. COVID-19 vaccines work well to prevent severe illness, hospitalization, and death. However, public health experts are seeing decreases in the protection COVID-19 vaccines provide over time, especially for certain groups of people. Due to this, CDC recommends that everyone ages 67 and older get a booster for the best protection against COVID-19. Learn more about COVID-19 booster recommendations, including recommendations for people who are moderately or severely immunocompromised. CDC continues to review evidence and updates guidance as new information becomes available. Do COVID-19  vaccines affect your menstrual cycle (period)? Results from recent research studies show that people who menstruate may observe small, temporary changes in menstruation after COVID-19 vaccination, including: Longer duration of menstrual periods Shorter intervals between periods Heavier bleeding than usual Despite these temporary changes in menstruation, there is no evidence that COVID-19 vaccines cause fertility problems. Learn more about COVID-19 vaccination for people who would like to have a baby. Safety Are COVID-19 vaccines safe even though the vaccines were developed rapidly? Although COVID-19 vaccines were developed quickly, research and development on vaccines like these have been underway for decades. All vaccine development steps were taken to ensure COVID-19 vaccine safety and effectiveness, including: Clinical Trials - All vaccines in the [place] must go through three phases of clinical trials to ensure they are safe and effective. The phases overlapped to speed up the process, but all phases were completed. Authorization or Approval - Before vaccines are available to people, the U.S. Food and Drug Administration (FDA) reviews data from clinical trials. FDA has determined three COVID-19 vaccines meet FDA's standards and has granted those vaccines Emergency Use Authorizations (EUAs) or full FDA approval. Tracking Safety Using Vaccine Monitoring Systems - Like every other vaccine approved for use in the [place], COVID-19 vaccines continue to be monitored for safety and effectiveness. Hundreds of millions of people in the [place] have safely received COVID-19 vaccines. CDC and FDA continue to provide updated information on the safety of U.S. authorized or approved COVID-19 vaccines using data from several monitoring systems. Learn more about developing COVID-19 vaccines. What are the ingredients in COVID-19 vaccines? Vaccine ingredients vary by manufacturer. None of the  vaccines contain eggs, gelatin, latex, or preservatives. All COVID-19 vaccines are free from metals, such as iron, nickel,  cobalt, lithium, and rare earth alloys. They are also free from manufactured products such as microelectronics, electrodes, carbon nanotubes, and nanowire semiconductors. None of the COVID-19 vaccines authorized or approved in the [place] contain any live virus. To learn more about the ingredients in authorized or approved COVID-19 vaccines, see Pfizer-BioNTech COVID-19 Vaccine Overview and Safety Moderna COVID-19 Vaccine Overview and Safety [employer]'s [employer] Overall COVID-19 Vaccine Overview and Safety Ingredients Included in COVID-19 Vaccines If I am pregnant or planning to become pregnant, can I get a COVID-19 vaccine? Yes, COVID-19 vaccination is recommended for people who are pregnant, breastfeeding, or trying to get pregnant now, as well as people who might become pregnant in the future. People with COVID-19 during pregnancy are more likely to deliver a preterm (earlier than 37 weeks) or stillborn infant and may also be more likely to have other pregnancy complications. COVID-19 vaccination during pregnancy helps Prevent severe illness and death in people who are pregnant Protect babies younger than 49 months old from hospitalization caused by COVID-2 Learn more about vaccination considerations and the safety and effectiveness of COVID-19 vaccinations for people who are pregnant or breastfeeding. If you are pregnant and have received a COVID-19 vaccine, we encourage you to enroll in v-safe, CDC's smartphone-based system that provides personalized health check-ins after vaccination. A v-safe pregnancy registry has been established to gather information on the health of pregnant people who have received a COVID-19 vaccine. Why should my children and teens get vaccinated against COVID-19? COVID-19 can make children and teens very sick and sometimes requires treatment in  a hospital. Getting eligible children and teens vaccinated against COVID-19 can help keep them from getting really sick if they do get COVID-19, including protecting them from short and long-term complications and hospitalization. Vaccinating children can also help keep them in school or daycare and safely participating in sports, playdates, and other group activities. The benefits of COVID-19 vaccination outweigh the known and potential risks. CDC recommends everyone ages 31 years and older get vaccinated against COVID-73. Everyone ages 60 years and older should also get a COVID-19 booster shot. Learn 6 Things About the COVID-19 Vaccine for Children. Preparing for your vaccine Why should I get vaccinated if I might get COVID-19 anyway? COVID-19 vaccination significantly lowers your risk of severe illness, hospitalization, and death if you get infected. Compared to people who are up to date with their COVID-19 vaccinations, unvaccinated people aremore likely to get COVID-19, much more likely to be hospitalized with COVID-19, and much more likely to die from COVID-19. Like all vaccines, COVID-19 vaccines are not 100% effective at preventing infection. Some people who are up to date with their COVID-19 vaccinations will get COVID-19 breakthrough infection. However, staying up to date with your COVID-19 vaccinations means that you are less likely to have a breakthrough infection and, if you do get sick, you are less likely to get severely ill or die. Staying up to date with COVID-19 vaccination also means you are less likely to spread the disease to others and increases your protection against new variants of SARS-CoV-2, the virus that causes COVID-19. Do I need to wait after getting a flu vaccine or another vaccine before getting a COVID-19 vaccine? There is no recommended waiting period between getting a COVID-19 vaccine and other vaccines. You can get a COVID-19 vaccine and other vaccines, including a flu  vaccine, at the same visit. Experience with other vaccines has shown that the way our bodies develop protection, known as an immune response,  and possible side effects after getting vaccinated are generally the same when given alone or with other vaccines. If I already had COVID-19 and recovered, do I still need to get a COVID-19 vaccine? You should get a COVID-19 vaccine even if you already had COVID-19. Getting a COVID-19 vaccine after you recover from COVID-19 infection provides added protection against COVID-19. People who already had COVID-19 and do not get vaccinated after their recovery are more likely to get COVID-19 again than those who get vaccinated after their recovery. Learn more about the benefits of getting a COVID-19 vaccine. Can I get vaccinated against COVID-19 while I am currently sick with COVID-19? No. People with COVID-19 should wait to be vaccinated until after they complete their isolation period. People who have symptoms will end isolation at a different time than people who do not have symptoms. This also applies to people who have been vaccinated but get COVID-19 before getting any additional or booster doses. People who have had a known COVID-19 exposure should not seek vaccination until their quarantine period has ended to avoid potentially exposing healthcare personnel and others during the vaccination visit. This recommendation to wait also applies to people with a known COVID-19 exposure who have received their first dose and are in need of additional or booster doses. Can I choose which COVID-19 vaccine I get? Yes, depending on your age, you can choose which COVID-19 vaccine to get. For adults ages 90 years and older, the mRNA COVID-19 vaccines (Pfizer-BioNTech or Moderna) are preferred over [employer] (J&J/Janssen) COVID-19 vaccine. You may get J&J/Janssen COVID-19 vaccine in some situations. Currently, Pfizer-BioNTech COVID-19 vaccine is the only COVID-19  vaccine available to children ages 86 through 44 years old.  Learn more about your COVID-19 vaccination, including how to find a vaccination location, what to expect at your appointment, and more. After your vaccine How can I get a new CDC COVID-19 vaccination card? If you have lost your CDC COVID-19 Vaccination card or don't have a copy of it, contact your vaccination provider directly to request a new vaccination card. They may be able to reissue a CDC COVID-19 Vaccination card. If you cannot contact your vaccination provider directly or your vaccination provider cannot reissue a CDC COVID-19 Vaccination card, contact your state health department's immunization information system (IIS). Your state's IIS cannot issue you a vaccination card, but they can provide a digital or paper copy of your full vaccination record, including your COVID-19 vaccinations. If you need another COVID-19 vaccine dose and are unable to get a copy of your vaccination card or vaccination record, talk to a vaccination provider to learn about your possible options. Some vaccination providers and health departments may offer you access to a QR code or digital copy of your CDC COVID-19 Vaccination card in addition to giving you a physical card. Contact your vaccination provider or local health department to learn if you can get a digital copy of your card. CDC does not provide the white CDC COVID-19 Vaccination card to people and does not maintain vaccination records. CDC distributes the [employer] COVID-19 Vaccination cards to vaccination providers and only a vaccination provider can give you this card. Do I need to wear a mask and avoid close contact with others if I am vaccinated? Generally, if you are up to date on your COVID-19 vaccinations, you do not need to wear a mask in outdoor settings. Check your local COVID-19 Community Level for recommendations on when to wear a mask indoors  and additional precautions you can take to  protect yourself from COVID-19. If you are immunocompromised or more likely to get very sick from COVID-19, learn more about how to protect yourself. Should I wear a mask if I have a weak immune system? If you have a condition or are taking medications that weaken your immune system, your immune response to COVID-19 vaccination may not be as strong as in people who are not immunocompromised. Check your county's EGBTD-17 Community Level for recommendations on whether you should wear a mask and additional actions you can take to protect yourself from COVID-19. Keep in mind that you may choose to wear a mask at any time based on your own level of comfort and personal risk. Learn more about COVID-19 vaccinations for people who are moderately or severely immunocompromised. I was vaccinated in another country. How do I transfer my proof of vaccination from that country to get a proof of vaccination card in the [place]? The white CDC COVID-19 vaccination cards are only issued to people vaccinated in the [place]. However, there are several ways you can update your records with vaccines you received while outside the [place]. Learn more about COVID-19 Vaccines for People Who Were Vaccinated Abroad. Am I considered fully vaccinated if I was vaccinated in another country? It depends on a number of factors. Learn more about COVID-19 Vaccines for People Who Were Vaccinated Abroad. Answers to more questions about: Clinical research associate and [place].gov Vaccine Administration Management System (VAMS) V-safe after Vaccination Health Checker Related pages FAQs about Vaccination in [place] and Facts about COVID-19 Vaccines 08/03/2020 Content source: [employer] for Immunization and Respiratory Diseases ([facility]), Division of Viral Diseases This information is not intended to replace advice given to you by your health care provider. Make sure  you discuss any questions you have with your health care provider. Document Revised: 08/23/2020 Document Reviewed: 08/23/2020 Elsevier Patient Education  [place].

## 2021-04-23 NOTE — Assessment & Plan Note (Signed)
Chronic, ongoing with lifelong need for anticoagulation.  Continue Eliquis and send refills as needed.  Refills as needed.  Monitor CBC regularly and kidney function.

## 2021-05-08 ENCOUNTER — Telehealth: Payer: Self-pay | Admitting: Nurse Practitioner

## 2021-05-08 NOTE — Telephone Encounter (Signed)
Copied from [place] [phone]. Topic: General - Other >> May 08, 2021  3:43 PM McGill, Nelva Bush wrote: Reason for CRM: Pt stated she needs copies of her flu shots.

## 2021-05-09 NOTE — Telephone Encounter (Signed)
Spoke to patient, advised we will have printed copy of recent flu vaccine at the front for her. Patient states she will come pick up today. Placed in [facility].

## 2021-05-17 ENCOUNTER — Other Ambulatory Visit: Payer: BC Managed Care – PPO

## 2021-07-31 ENCOUNTER — Encounter: Payer: Self-pay | Admitting: Nurse Practitioner

## 2021-07-31 DIAGNOSIS — I10 Essential (primary) hypertension: Secondary | ICD-10-CM

## 2021-07-31 MED ORDER — HYDRALAZINE HCL 25 MG PO TABS: ORAL_TABLET | Freq: Every day | ORAL | 4 refills | Status: DC

## 2021-08-15 ENCOUNTER — Encounter: Payer: Self-pay | Admitting: Oncology

## 2021-09-01 ENCOUNTER — Encounter: Payer: Self-pay | Admitting: Oncology

## 2021-09-01 NOTE — Patient Instructions (Signed)

## 2021-09-04 ENCOUNTER — Ambulatory Visit (INDEPENDENT_AMBULATORY_CARE_PROVIDER_SITE_OTHER): Payer: Commercial Managed Care - PPO | Admitting: Nurse Practitioner

## 2021-09-04 ENCOUNTER — Encounter: Payer: Self-pay | Admitting: Nurse Practitioner

## 2021-09-04 VITALS — BP 119/75 | HR 79 | Temp 98.3°F | Ht 64.0 in | Wt 117.4 lb

## 2021-09-04 DIAGNOSIS — G894 Chronic pain syndrome: Secondary | ICD-10-CM

## 2021-09-04 DIAGNOSIS — D6869 Other thrombophilia: Secondary | ICD-10-CM | POA: Diagnosis not present

## 2021-09-04 DIAGNOSIS — R7303 Prediabetes: Secondary | ICD-10-CM

## 2021-09-04 DIAGNOSIS — I10 Essential (primary) hypertension: Secondary | ICD-10-CM | POA: Diagnosis not present

## 2021-09-04 DIAGNOSIS — E782 Mixed hyperlipidemia: Secondary | ICD-10-CM

## 2021-09-04 DIAGNOSIS — F5104 Psychophysiologic insomnia: Secondary | ICD-10-CM

## 2021-09-04 DIAGNOSIS — R7989 Other specified abnormal findings of blood chemistry: Secondary | ICD-10-CM

## 2021-09-04 DIAGNOSIS — E890 Postprocedural hypothyroidism: Secondary | ICD-10-CM

## 2021-09-04 DIAGNOSIS — Z87891 Personal history of nicotine dependence: Secondary | ICD-10-CM

## 2021-09-04 DIAGNOSIS — F321 Major depressive disorder, single episode, moderate: Secondary | ICD-10-CM | POA: Diagnosis not present

## 2021-09-04 DIAGNOSIS — Z86711 Personal history of pulmonary embolism: Secondary | ICD-10-CM

## 2021-09-04 DIAGNOSIS — Z86718 Personal history of other venous thrombosis and embolism: Secondary | ICD-10-CM

## 2021-09-04 DIAGNOSIS — R634 Abnormal weight loss: Secondary | ICD-10-CM

## 2021-09-04 LAB — BAYER DCA HB A1C WAIVED: HB A1C (BAYER DCA - WAIVED): 5.4 % (ref 4.8–5.6)
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
## 2021-09-04 NOTE — Assessment & Plan Note (Signed)
Chronic, ongoing, followed by Dr. David Marks at [facility] in [place].  Continue this collaboration and medication as ordered by him, she is aware PCP does not perform chronic pain management and refills must come from pain management provider.   

## 2021-09-04 NOTE — Assessment & Plan Note (Signed)
Chronic, stable -- will trial of Hydralazine for now and if stable then remain off.  Recommend she monitor BP at least a few mornings a week at home and document.  DASH diet at home.  Labs: CBC, BMP, lipid.   ? ?

## 2021-09-04 NOTE — Assessment & Plan Note (Signed)
On lifelong Eliquis due to history of bilateral PE and history of DVT.  Monitor CBC regularly and monitor for increased bruising or bleeding.  Return to hematology as needed. 

## 2021-09-04 NOTE — Assessment & Plan Note (Signed)
Chronic, ongoing with history of right lobectomy 08/07/2019.  At this time will monitor thyroid levels in office and adjust dose of Levothyroxine as needed.  Labs: TSH and Free T4 today. ?

## 2021-09-04 NOTE — Assessment & Plan Note (Signed)
In 2005.  Chronic, ongoing with lifelong need for anticoagulation.  Continue Eliquis and send refills as needed.  Monitor CBC regularly and kidney function.   

## 2021-09-04 NOTE — Assessment & Plan Note (Signed)
Chronic, ongoing, continues on Ambien which is written for by Dr. David Marks at [facility] in [place] -- both psychiatry and pain management provider.  Continue this collaboration and current medications as prescribed by him. 

## 2021-09-04 NOTE — Assessment & Plan Note (Signed)
Has lost 32 pounds over past several months unintentionally.  Is a former smoker. Will obtain CT lung to further assess and check labs today.  Recommend she start drinking protein shake, Premiere or Ensure, 2 times a day as does not eat large meals and does not eat breakfast.  Is on Remeron per psych, continue this.  Follow-up in 6 weeks. ?

## 2021-09-04 NOTE — Assessment & Plan Note (Signed)
A1c 5.4% today, well below prediabetes level.  Continue current diet and exercise focus.  Urine ALB 30, allergic reaction in ACE in past, could consider ARB trial. ?

## 2021-09-04 NOTE — Assessment & Plan Note (Addendum)
Noted on recent labs, followed by pulmonary at this time.  Will continue to monitor. ?

## 2021-09-04 NOTE — Assessment & Plan Note (Signed)
Recommend continued cessation.  
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
## 2021-09-04 NOTE — Assessment & Plan Note (Signed)
Chronic, ongoing with lifelong need for anticoagulation.  Continue Eliquis and send refills as needed.  Refills sent in, may need CCM referral to assist in future.  Monitor CBC regularly and kidney function.   ?

## 2021-09-05 LAB — CBC WITH DIFFERENTIAL/PLATELET
Basophils Absolute: 0 10*3/uL (ref 0.0–0.2)
Basos: 0 %
EOS (ABSOLUTE): 0.3 10*3/uL (ref 0.0–0.4)
Eos: 5 %
Hematocrit: 36.1 % (ref 34.0–46.6)
Hemoglobin: 12 g/dL (ref 11.1–15.9)
Immature Grans (Abs): 0 10*3/uL (ref 0.0–0.1)
Immature Granulocytes: 0 %
Lymphocytes Absolute: 1.7 10*3/uL (ref 0.7–3.1)
Lymphs: 33 %
MCH: 29 pg (ref 26.6–33.0)
MCHC: 33.2 g/dL (ref 31.5–35.7)
MCV: 87 fL (ref 79–97)
Monocytes Absolute: 0.3 10*3/uL (ref 0.1–0.9)
Monocytes: 6 %
Neutrophils Absolute: 2.9 10*3/uL (ref 1.4–7.0)
Neutrophils: 56 %
Platelets: 265 10*3/uL (ref 150–450)
RBC: 4.14 x10E6/uL (ref 3.77–5.28)
RDW: 13.9 % (ref 11.7–15.4)
WBC: 5.2 10*3/uL (ref 3.4–10.8)

## 2021-09-05 LAB — BASIC METABOLIC PANEL
BUN/Creatinine Ratio: 10 (ref 9–23)
BUN: 8 mg/dL (ref 6–24)
CO2: 25 mmol/L (ref 20–29)
Calcium: 9.2 mg/dL (ref 8.7–10.2)
Chloride: 104 mmol/L (ref 96–106)
Creatinine, Ser: 0.81 mg/dL (ref 0.57–1.00)
Glucose: 108 mg/dL — ABNORMAL HIGH (ref 70–99)
Potassium: 3 mmol/L — ABNORMAL LOW (ref 3.5–5.2)
Sodium: 145 mmol/L — ABNORMAL HIGH (ref 134–144)
eGFR: 85 mL/min/{1.73_m2} (ref >59)

## 2021-09-05 LAB — LIPID PANEL W/O CHOL/HDL RATIO
Cholesterol, Total: 161 mg/dL (ref 100–199)
HDL: 54 mg/dL (ref >39)
LDL Chol Calc (NIH): 89 mg/dL (ref 0–99)
Triglycerides: 99 mg/dL (ref 0–149)
VLDL Cholesterol Cal: 18 mg/dL (ref 5–40)

## 2021-09-05 LAB — FERRITIN: Ferritin: 237 ng/mL — ABNORMAL HIGH (ref 15–150)

## 2021-09-05 MED ORDER — POTASSIUM CHLORIDE CRYS ER 10 MEQ PO TBCR: 10.0000 meq | EXTENDED_RELEASE_TABLET | Freq: Every day | ORAL | 0 refills | Status: DC

## 2021-09-05 NOTE — Progress Notes (Signed)
Contacted via [place] -- needs lab visit in one week please    Good afternoon Tracey Perez, your labs have returned: - Kidney function, creatinine and eGFR, remains normal. - Sodium level a little elevated, I recommend increasing fluid intake and decreasing salt intake.  Potassium is quite low at 3.  I am going to send in 5 days of a potassium supplement for you to take and would like you to return next week for lab only visit to recheck.  Also ensure to increase potassium in diet -- bananas, mangoes, avocados, dried fruit.  - Ferritin level remains a little elevated, but this is trending down.  - Cholesterol levels stable, better this check -- keep focus on diet. - CBC shows no anemia or infection. Keep being amazing!!  Thank you for allowing me to participate in your care.  I appreciate you. Kindest regards, Kendrew Paci

## 2021-09-05 NOTE — Addendum Note (Signed)
Addended by: Marnee Guarneri T on: 09/05/2021 04:19 PM   Modules accepted: Orders

## 2021-09-06 LAB — SPECIMEN STATUS REPORT

## 2021-09-06 LAB — TSH: TSH: 1.05 u[IU]/mL (ref 0.450–4.500)

## 2021-09-06 LAB — T4, FREE: Free T4: 1.57 ng/dL (ref 0.82–1.77)

## 2021-09-06 NOTE — Progress Notes (Signed)
Contacted via MyChart   Good afternoon Tracey Perez, your thyroid labs remain stable.  Please ensure to go for lung imaging and if they do not call to schedule over next week let me know:)

## 2021-09-13 ENCOUNTER — Other Ambulatory Visit: Payer: Commercial Managed Care - PPO

## 2021-09-13 DIAGNOSIS — R7303 Prediabetes: Secondary | ICD-10-CM

## 2021-09-13 DIAGNOSIS — I10 Essential (primary) hypertension: Secondary | ICD-10-CM

## 2021-09-13 LAB — MICROALBUMIN, URINE WAIVED
Creatinine, Urine Waived: 50 mg/dL (ref 10–300)
Microalb, Ur Waived: 30 mg/L — ABNORMAL HIGH (ref 0–19)

## 2021-09-14 LAB — BASIC METABOLIC PANEL
BUN/Creatinine Ratio: 23 (ref 9–23)
BUN: 15 mg/dL (ref 6–24)
CO2: 28 mmol/L (ref 20–29)
Calcium: 8.7 mg/dL (ref 8.7–10.2)
Chloride: 101 mmol/L (ref 96–106)
Creatinine, Ser: 0.66 mg/dL (ref 0.57–1.00)
Glucose: 73 mg/dL (ref 70–99)
Potassium: 3.3 mmol/L — ABNORMAL LOW (ref 3.5–5.2)
Sodium: 140 mmol/L (ref 134–144)
eGFR: 103 mL/min/{1.73_m2} (ref >59)

## 2021-09-15 ENCOUNTER — Other Ambulatory Visit: Payer: Self-pay | Admitting: Nurse Practitioner

## 2021-09-15 DIAGNOSIS — E876 Hypokalemia: Secondary | ICD-10-CM | POA: Insufficient documentation

## 2021-09-15 MED ORDER — POTASSIUM CHLORIDE CRYS ER 10 MEQ PO TBCR: 10.0000 meq | EXTENDED_RELEASE_TABLET | Freq: Every day | ORAL | 4 refills | Status: DC

## 2021-09-15 NOTE — Progress Notes (Signed)
Contacted via [place] -- needs lab visit around June 12th after I return from vacation.   Good evening Tracey Perez, your labs have returned.  Potassium is improving, but still on low side.  I want you to continue potassium supplement daily for now and then we will recheck around June 12th when I return from vacation. Try adding some potassium rich foods too like bananas, mangoes, dried fruit.  Any questions? Keep being wonderful!!  Thank you for allowing me to participate in your care.  I appreciate you. Kindest regards, Ginette Bradway

## 2021-09-19 ENCOUNTER — Other Ambulatory Visit: Payer: Self-pay

## 2021-10-14 ENCOUNTER — Ambulatory Visit: Payer: Commercial Managed Care - PPO | Admitting: Nurse Practitioner

## 2021-10-17 ENCOUNTER — Other Ambulatory Visit: Payer: Commercial Managed Care - PPO

## 2021-10-17 DIAGNOSIS — E876 Hypokalemia: Secondary | ICD-10-CM

## 2021-10-18 LAB — BASIC METABOLIC PANEL
BUN/Creatinine Ratio: 12 (ref 9–23)
BUN: 10 mg/dL (ref 6–24)
CO2: 30 mmol/L — ABNORMAL HIGH (ref 20–29)
Calcium: 9.5 mg/dL (ref 8.7–10.2)
Chloride: 100 mmol/L (ref 96–106)
Creatinine, Ser: 0.83 mg/dL (ref 0.57–1.00)
Glucose: 82 mg/dL (ref 70–99)
Potassium: 4.5 mmol/L (ref 3.5–5.2)
Sodium: 142 mmol/L (ref 134–144)
eGFR: 83 mL/min/{1.73_m2} (ref >59)

## 2021-10-18 NOTE — Progress Notes (Signed)
Contacted via MyChart   Good afternoon Tracey Perez your labs have returned and everything looks better this check.  I would recommend continue supplement for now.  Any questions?

## 2021-10-20 NOTE — Patient Instructions (Signed)

## 2021-10-24 ENCOUNTER — Encounter: Payer: Self-pay | Admitting: Nurse Practitioner

## 2021-10-24 ENCOUNTER — Ambulatory Visit: Payer: Commercial Managed Care - PPO | Admitting: Nurse Practitioner

## 2021-10-24 VITALS — BP 114/73 | HR 94 | Temp 98.5°F | Ht 64.0 in | Wt 116.6 lb

## 2021-10-24 DIAGNOSIS — R634 Abnormal weight loss: Secondary | ICD-10-CM | POA: Diagnosis not present

## 2021-10-24 DIAGNOSIS — Z87891 Personal history of nicotine dependence: Secondary | ICD-10-CM | POA: Diagnosis not present

## 2021-10-24 NOTE — Progress Notes (Deleted)
   BP 114/73   Pulse 94   Temp 98.5 F (36.9 C) (Oral)   Ht _0  (1.626 m)   Wt 116 lb 9.6 oz (52.9 kg)   SpO2 99%   BMI 20.01 kg/m    Subjective:    Patient ID: Tracey Perez, female    DOB: 07/12/1965, 56 y.o.   MRN: [id number]  HPI: Tracey Perez is a 56 y.o. female  Chief Complaint  Patient presents with   Weight Check    Patient is here for Weight Check follow up. Patient denies having any concerns at today's visit.    WEIGHT LOSS: Presents today for weight check, has unintentionally lost weight.  Was 145 lbs in June 2022 and currently 116 lbs.  She has lost 1 lbs since recent visit.  Ordered a chest CT recent visit, which she has not attended.  Former smoker.  Relevant past medical, surgical, family and social history reviewed and updated as indicated. Interim medical history since our last visit reviewed. Allergies and medications reviewed and updated.  Review of Systems  Per HPI unless specifically indicated above     Objective:    BP 114/73   Pulse 94   Temp 98.5 F (36.9 C) (Oral)   Ht _1  (1.626 m)   Wt 116 lb 9.6 oz (52.9 kg)   SpO2 99%   BMI 20.01 kg/m   Wt Readings from Last 3 Encounters:  10/24/21 116 lb 9.6 oz (52.9 kg)  09/04/21 117 lb 6.4 oz (53.3 kg)  04/23/21 127 lb (57.6 kg)    Physical Exam  Results for orders placed or performed in visit on 68/61/68  Basic Metabolic Panel (BMET)  Result Value Ref Range   Glucose 82 70 - 99 mg/dL   BUN 10 6 - 24 mg/dL   Creatinine, Ser 0.83 0.57 - 1.00 mg/dL   eGFR 83 >59 mL/min/1.73   BUN/Creatinine Ratio 12 9 - 23   Sodium 142 134 - 144 mmol/L   Potassium 4.5 3.5 - 5.2 mmol/L   Chloride 100 96 - 106 mmol/L   CO2 30 (H) 20 - 29 mmol/L   Calcium 9.5 8.7 - 10.2 mg/dL      Assessment & Plan:   Problem List Items Addressed This Visit   None    Follow up plan: No follow-ups on file.

## 2021-10-24 NOTE — Assessment & Plan Note (Signed)
Recommend continued cessation and lung CT for screening.

## 2021-10-24 NOTE — Assessment & Plan Note (Addendum)
She continues to lose weight.145 lbs in June 2022, today she is 116 lbs. Recommend to schedule her CT lung. She is a former smoker. She is drinking 2 protein shakes a day, recommend to continue this. On Remeron per psych, continue this. Labs today: celiac and alpha gal. Referral to GI placed. Follow up in 8 weeks for weight check.

## 2021-10-24 NOTE — Progress Notes (Signed)
BP 114/73   Pulse 94   Temp 98.5 F (36.9 C) (Oral)   Ht $R'5\' 4"'OU$  (1.626 m)   Wt 116 lb 9.6 oz (52.9 kg)   SpO2 99%   BMI 20.01 kg/m    Subjective:    Patient ID: Tracey Perez, female    DOB: 1965-08-09, 56 y.o.   MRN: [id number]  HPI: Tracey Perez is a 56 y.o. female  Chief Complaint  Patient presents with   Weight Check    Patient is here for Weight Check follow up. Patient denies having any concerns at today's visit.    NOTE WRITTEN BY FNP STUDENT.  ASSESSMENT AND PLAN OF CARE REVIEWED WITH STUDENT, AGREE WITH ABOVE FINDINGS AND PLAN.   WEIGHT LOSS: Presents today for weight check, has unintentionally lost weight.  Was 145 lbs in June 2022 and currently 116 lbs.  She has lost 1 lbs since recent visit.  Ordered a chest CT recent visit, which she has not attended.  Former smoker. She hasn't been able to get to CT due to her work schedule.   She is doing 2 protein shakes a day and snacks throughout the day, she says she has trouble eating large meals since her surgery in 2022 hiatal hernia repair.  Relevant past medical, surgical, family and social history reviewed and updated as indicated. Interim medical history since our last visit reviewed. Allergies and medications reviewed and updated.  Review of Systems  Constitutional:  Positive for unexpected weight change. Negative for activity change, appetite change, diaphoresis, fatigue and fever.  Respiratory:  Negative for cough, chest tightness, shortness of breath and wheezing.   Cardiovascular:  Negative for chest pain, palpitations and leg swelling.  Gastrointestinal:  Positive for constipation (occasional) and diarrhea (occaisonal). Negative for abdominal distention, abdominal pain, nausea and vomiting.  Endocrine: Negative for cold intolerance and heat intolerance.  Genitourinary: Negative.   Musculoskeletal: Negative.   Neurological: Negative.   Psychiatric/Behavioral: Negative.      Per HPI unless specifically  indicated above     Objective:    BP 114/73   Pulse 94   Temp 98.5 F (36.9 C) (Oral)   Ht $R'5\' 4"'eN$  (1.626 m)   Wt 116 lb 9.6 oz (52.9 kg)   SpO2 99%   BMI 20.01 kg/m   Wt Readings from Last 3 Encounters:  10/24/21 116 lb 9.6 oz (52.9 kg)  09/04/21 117 lb 6.4 oz (53.3 kg)  04/23/21 127 lb (57.6 kg)    Physical Exam Vitals and nursing note reviewed.  Constitutional:      General: She is not in acute distress.    Appearance: She is well-developed and well-groomed. She is not ill-appearing or toxic-appearing.  HENT:     Head: Normocephalic.     Right Ear: Hearing normal.     Left Ear: Hearing normal.  Eyes:     General: Lids are normal.        Right eye: No discharge.        Left eye: No discharge.     Conjunctiva/sclera: Conjunctivae normal.  Neck:     Thyroid: No thyromegaly.     Vascular: No carotid bruit.  Cardiovascular:     Rate and Rhythm: Normal rate and regular rhythm.     Heart sounds: Normal heart sounds. No murmur heard.    No gallop.  Pulmonary:     Effort: Pulmonary effort is normal. No accessory muscle usage or respiratory distress.     Breath sounds:  Normal breath sounds.  Abdominal:     General: Bowel sounds are normal. There is no distension.     Palpations: Abdomen is soft.     Tenderness: There is no abdominal tenderness.  Musculoskeletal:     Cervical back: Normal range of motion and neck supple.     Right lower leg: No edema.     Left lower leg: No edema.  Lymphadenopathy:     Cervical: No cervical adenopathy.  Skin:    General: Skin is warm and dry.  Neurological:     Mental Status: She is alert and oriented to person, place, and time.     Deep Tendon Reflexes: Reflexes are normal and symmetric.  Psychiatric:        Attention and Perception: Attention normal.        Mood and Affect: Mood normal.        Speech: Speech normal.        Behavior: Behavior normal. Behavior is cooperative.        Thought Content: Thought content normal.     Results for orders placed or performed in visit on 41/66/06  Basic Metabolic Panel (BMET)  Result Value Ref Range   Glucose 82 70 - 99 mg/dL   BUN 10 6 - 24 mg/dL   Creatinine, Ser 0.83 0.57 - 1.00 mg/dL   eGFR 83 >59 mL/min/1.73   BUN/Creatinine Ratio 12 9 - 23   Sodium 142 134 - 144 mmol/L   Potassium 4.5 3.5 - 5.2 mmol/L   Chloride 100 96 - 106 mmol/L   CO2 30 (H) 20 - 29 mmol/L   Calcium 9.5 8.7 - 10.2 mg/dL      Assessment & Plan:   Problem List Items Addressed This Visit       Other   Former cigarette smoker    Recommend continued cessation and lung CT for screening.       Unintended weight loss - Primary    She continues to lose weight.145 lbs in June 2022, today she is 116 lbs. Recommend to schedule her CT lung. She is a former smoker. She is drinking 2 protein shakes a day, recommend to continue this. On Remeron per psych, continue this. Labs today: celiac and alpha gal. Referral to GI placed. Follow up in 8 weeks for weight check.       Relevant Orders   Celiac Disease Panel   Alpha-Gal Panel   Ambulatory referral to Gastroenterology     Follow up plan: Return in about 8 weeks (around 12/19/2021) for WEIGHT CHECK.

## 2021-10-27 LAB — ALPHA-GAL PANEL
Allergen Lamb IgE: <0.1 kU/L
Beef IgE: <0.1 kU/L
IgE (Immunoglobulin E), Serum: 203 [IU]/mL (ref 6–495)
O215-IgE Alpha-Gal: <0.1 kU/L
Pork IgE: <0.1 kU/L

## 2021-10-27 LAB — CELIAC DISEASE PANEL
Endomysial IgA: NEGATIVE
Immunoglobulin A, (IgA) QN, Serum: 211 mg/dL (ref 87–352)
t-Transglutaminase (tTG) IgA: <2 U/mL (ref 0–3)

## 2021-10-27 NOTE — Progress Notes (Signed)
Contacted via MyChart   Alpha Gal and Celiac testing is negative.

## 2021-10-28 ENCOUNTER — Encounter: Payer: Self-pay | Admitting: Nurse Practitioner

## 2021-10-30 ENCOUNTER — Encounter: Payer: Self-pay | Admitting: Nurse Practitioner

## 2021-11-12 ENCOUNTER — Telehealth: Payer: Self-pay

## 2021-11-12 NOTE — Telephone Encounter (Signed)
Prior authorization was initiated via [employer]. Prior authorization EOC ID: [id number] MemberID: [id number]  Awaiting determination from patient's insurance.

## 2021-11-14 NOTE — Telephone Encounter (Signed)
Checked status of patient's prior authorization initiated back on 11/12/21. Status says "In Progress." Awaiting patient's insurance.

## 2021-11-14 NOTE — Telephone Encounter (Signed)
Noted  

## 2021-11-19 ENCOUNTER — Encounter: Payer: Self-pay | Admitting: Nurse Practitioner

## 2021-11-19 NOTE — Telephone Encounter (Signed)
According to received fax, looks at if the patient was denied. Received fax were received and placed in provider's folder. Please advise?

## 2021-11-20 NOTE — Telephone Encounter (Signed)
Completed paperwork was faxed along with requested office notes.

## 2021-12-14 NOTE — Patient Instructions (Signed)

## 2021-12-20 ENCOUNTER — Encounter: Payer: Self-pay | Admitting: Nurse Practitioner

## 2021-12-20 ENCOUNTER — Ambulatory Visit: Payer: Commercial Managed Care - PPO | Admitting: Nurse Practitioner

## 2021-12-20 VITALS — BP 114/79 | HR 93 | Temp 98.3°F | Ht 64.02 in | Wt 118.5 lb

## 2021-12-20 DIAGNOSIS — R634 Abnormal weight loss: Secondary | ICD-10-CM

## 2021-12-20 DIAGNOSIS — Z23 Encounter for immunization: Secondary | ICD-10-CM | POA: Diagnosis not present

## 2021-12-20 NOTE — Assessment & Plan Note (Signed)
Lost 32 pounds over past several months unintentionally -- currently is gaining -- gained 2 lbs since recent visit.  Is a former smoker. Ordered CT lung and recommend she attain when able.  Recommend she continue drinking protein shakes, Premiere or Ensure, 2-3 times a day as does not eat large meals and does not eat breakfast.  Is on Remeron per psych, continue this.  Follow-up in December.

## 2021-12-20 NOTE — Progress Notes (Signed)
BP 114/79   Pulse 93   Temp 98.3 F (36.8 C) (Oral)   Ht 5' 4.02" (1.626 m)   Wt 118 lb 8 oz (53.8 kg)   SpO2 98%   BMI 20.33 kg/m    Subjective:    Patient ID: [name], female    DOB: October 09, 1965, 56 y.o.   MRN: [id number]  HPI: DIASHA CASTLEMAN is a 56 y.o. female  Chief Complaint  Patient presents with   Weight Check   WEIGHT LOSS: Presents today for weight check, unintentional lost weight recently. Was 145 lbs in June 2022 and at present she is starting to gain, currently 118 lbs -- has trended up 2 lbs.  Ordered a chest CT recent visit, which she has not attended.  Former smoker. She hasn't been able to get to CT due to her work schedule, but will try to schedule if weight loss returns.   She is doing a couple protein shakes a day and snacks throughout the day, she says she has trouble eating large meals since her surgery in 2022, hiatal hernia repair.  She denies any acute symptoms and reports she is feeling better at this time.  Relevant past medical, surgical, family and social history reviewed and updated as indicated. Interim medical history since our last visit reviewed. Allergies and medications reviewed and updated.  Review of Systems  Constitutional:  Positive for unexpected weight change. Negative for activity change, appetite change, diaphoresis, fatigue and fever.  Respiratory:  Negative for cough, chest tightness, shortness of breath and wheezing.   Cardiovascular:  Negative for chest pain, palpitations and leg swelling.  Gastrointestinal: Negative.   Endocrine: Negative for cold intolerance and heat intolerance.  Musculoskeletal: Negative.   Neurological: Negative.   Psychiatric/Behavioral: Negative.      Per HPI unless specifically indicated above     Objective:    BP 114/79   Pulse 93   Temp 98.3 F (36.8 C) (Oral)   Ht 5' 4.02" (1.626 m)   Wt 118 lb 8 oz (53.8 kg)   SpO2 98%   BMI 20.33 kg/m   Wt Readings from Last 3 Encounters:   12/20/21 118 lb 8 oz (53.8 kg)  10/24/21 116 lb 9.6 oz (52.9 kg)  09/04/21 117 lb 6.4 oz (53.3 kg)    Physical Exam Vitals and nursing note reviewed.  Constitutional:      General: She is not in acute distress.    Appearance: She is well-developed, well-groomed and underweight. She is not ill-appearing or toxic-appearing.  HENT:     Head: Normocephalic.     Right Ear: Hearing normal.     Left Ear: Hearing normal.  Eyes:     General: Lids are normal.        Right eye: No discharge.        Left eye: No discharge.     Conjunctiva/sclera: Conjunctivae normal.  Neck:     Thyroid: No thyromegaly.     Vascular: No carotid bruit.  Cardiovascular:     Rate and Rhythm: Normal rate and regular rhythm.     Heart sounds: Normal heart sounds. No murmur heard.    No gallop.  Pulmonary:     Effort: Pulmonary effort is normal. No accessory muscle usage or respiratory distress.     Breath sounds: Normal breath sounds.  Abdominal:     General: Bowel sounds are normal. There is no distension.     Palpations: Abdomen is soft.  Tenderness: There is no abdominal tenderness.  Musculoskeletal:     Cervical back: Normal range of motion and neck supple.     Right lower leg: No edema.     Left lower leg: No edema.  Lymphadenopathy:     Cervical: No cervical adenopathy.  Skin:    General: Skin is warm and dry.  Neurological:     Mental Status: She is alert and oriented to person, place, and time.     Deep Tendon Reflexes: Reflexes are normal and symmetric.  Psychiatric:        Attention and Perception: Attention normal.        Mood and Affect: Mood normal.        Speech: Speech normal.        Behavior: Behavior normal. Behavior is cooperative.        Thought Content: Thought content normal.     Results for orders placed or performed in visit on 10/24/21  Celiac Disease Panel  Result Value Ref Range   Endomysial IgA Negative Negative   Transglutaminase IgA <2 0 - 3 U/mL    IgA/Immunoglobulin A, Serum 211 87 - 352 mg/dL  Alpha-Gal Panel  Result Value Ref Range   Class Description Allergens Comment    IgE (Immunoglobulin E), Serum 203 6 - 495 IU/mL   O215-IgE Alpha-Gal <0.10 Class 0 kU/L   Beef IgE <0.10 Class 0 kU/L   Pork IgE <0.10 Class 0 kU/L   Allergen Lamb IgE <0.10 Class 0 kU/L      Assessment & Plan:   Problem List Items Addressed This Visit       Other   Unintended weight loss - Primary    Lost 32 pounds over past several months unintentionally -- currently is gaining -- gained 2 lbs since recent visit.  Is a former smoker. Ordered CT lung and recommend she attain when able.  Recommend she continue drinking protein shakes, Premiere or Ensure, 2-3 times a day as does not eat large meals and does not eat breakfast.  Is on Remeron per psych, continue this.  Follow-up in December.      Relevant Orders   Basic metabolic panel   Other Visit Diagnoses     Need for shingles vaccine       Shingrix # 2 today.   Relevant Orders   Zoster Recombinant (Shingrix ) (Completed)        Follow up plan: Return in about 3 months (around 03/21/2022) for HTN/HLD, THYROID, MOOD, IRON ANEMIA.

## 2021-12-21 LAB — BASIC METABOLIC PANEL
BUN/Creatinine Ratio: 13 (ref 9–23)
BUN: 13 mg/dL (ref 6–24)
CO2: 23 mmol/L (ref 20–29)
Calcium: 9.8 mg/dL (ref 8.7–10.2)
Chloride: 104 mmol/L (ref 96–106)
Creatinine, Ser: 1.03 mg/dL — ABNORMAL HIGH (ref 0.57–1.00)
Glucose: 53 mg/dL — ABNORMAL LOW (ref 70–99)
Potassium: 3.4 mmol/L — ABNORMAL LOW (ref 3.5–5.2)
Sodium: 143 mmol/L (ref 134–144)
eGFR: 64 mL/min/{1.73_m2} (ref >59)

## 2021-12-22 ENCOUNTER — Encounter: Payer: Self-pay | Admitting: Nurse Practitioner

## 2021-12-22 ENCOUNTER — Other Ambulatory Visit: Payer: Self-pay | Admitting: Nurse Practitioner

## 2021-12-22 MED ORDER — POTASSIUM CHLORIDE CRYS ER 10 MEQ PO TBCR: 10.0000 meq | EXTENDED_RELEASE_TABLET | Freq: Every day | ORAL | 1 refills | Status: DC

## 2021-12-22 NOTE — Progress Notes (Signed)
Contacted via [place] morning Sen[place], labs have returned.  Potassium a little low again.  I recommend we continue 10 mEq potassium daily, have sent this in, to help maintain stable levels and will recheck next visit.  Also add potassium rich foods to diet.  Kidney function, creatinine and eGFR, remains normal.  Any questions? Keep being amazing!!  Thank you for allowing me to participate in your care.  I appreciate you. Kindest regards, Latana Colin

## 2022-01-10 ENCOUNTER — Emergency Department
Admission: EM | Admit: 2022-01-10 | Discharge: 2022-01-10 | Disposition: A | Discharge status: Home / Self Care | Payer: Commercial Managed Care - PPO | Attending: Emergency Medicine | Admitting: Emergency Medicine

## 2022-01-10 ENCOUNTER — Emergency Department: Payer: Commercial Managed Care - PPO

## 2022-01-10 ENCOUNTER — Other Ambulatory Visit: Payer: Self-pay

## 2022-01-10 ENCOUNTER — Encounter: Payer: Self-pay | Admitting: Oncology

## 2022-01-10 DIAGNOSIS — Z7901 Long term (current) use of anticoagulants: Secondary | ICD-10-CM | POA: Diagnosis not present

## 2022-01-10 DIAGNOSIS — R519 Headache, unspecified: Secondary | ICD-10-CM | POA: Diagnosis present

## 2022-01-10 LAB — CBC WITH DIFFERENTIAL/PLATELET
Abs Immature Granulocytes: 0.01 10*3/uL (ref 0.00–0.07)
Basophils Absolute: 0 10*3/uL (ref 0.0–0.1)
Basophils Relative: 0 %
Eosinophils Absolute: 0 10*3/uL (ref 0.0–0.5)
Eosinophils Relative: 1 %
HCT: 39.2 % (ref 36.0–46.0)
Hemoglobin: 13.5 g/dL (ref 12.0–15.0)
Immature Granulocytes: 0 %
Lymphocytes Relative: 21 %
Lymphs Abs: 1.3 10*3/uL (ref 0.7–4.0)
MCH: 30.3 pg (ref 26.0–34.0)
MCHC: 34.4 g/dL (ref 30.0–36.0)
MCV: 87.9 fL (ref 80.0–100.0)
Monocytes Absolute: 0.3 10*3/uL (ref 0.1–1.0)
Monocytes Relative: 5 %
Neutro Abs: 4.5 10*3/uL (ref 1.7–7.7)
Neutrophils Relative %: 73 %
Platelets: 335 10*3/uL (ref 150–400)
RBC: 4.46 MIL/uL (ref 3.87–5.11)
RDW: 12.9 % (ref 11.5–15.5)
WBC: 6.2 10*3/uL (ref 4.0–10.5)
nRBC: 0 % (ref 0.0–0.2)

## 2022-01-10 LAB — BASIC METABOLIC PANEL
Anion gap: 10 (ref 5–15)
BUN: 12 mg/dL (ref 6–20)
CO2: 26 mmol/L (ref 22–32)
Calcium: 9.4 mg/dL (ref 8.9–10.3)
Chloride: 103 mmol/L (ref 98–111)
Creatinine, Ser: 0.8 mg/dL (ref 0.44–1.00)
GFR, Estimated: >60 mL/min (ref >60)
Glucose, Bld: 102 mg/dL — ABNORMAL HIGH (ref 70–99)
Potassium: 3.3 mmol/L — ABNORMAL LOW (ref 3.5–5.1)
Sodium: 139 mmol/L (ref 135–145)

## 2022-01-10 MED ORDER — SODIUM CHLORIDE 0.9 % IV BOLUS
500.0000 mL | Freq: Once | INTRAVENOUS | Status: AC
  Administered 2022-01-10: 500 mL via INTRAVENOUS

## 2022-01-10 MED ORDER — IOHEXOL 350 MG/ML SOLN
75.0000 mL | Freq: Once | INTRAVENOUS | Status: AC | PRN
  Administered 2022-01-10: 75 mL via INTRAVENOUS

## 2022-01-10 MED ORDER — PROCHLORPERAZINE EDISYLATE 10 MG/2ML IJ SOLN
10.0000 mg | Freq: Once | INTRAMUSCULAR | Status: AC
  Administered 2022-01-10: 10 mg via INTRAVENOUS
  Filled 2022-01-10: qty 2

## 2022-01-10 MED ORDER — DIPHENHYDRAMINE HCL 50 MG/ML IJ SOLN
12.5000 mg | Freq: Once | INTRAMUSCULAR | Status: AC
  Administered 2022-01-10: 12.5 mg via INTRAVENOUS
  Filled 2022-01-10: qty 1

## 2022-01-10 MED ORDER — ONDANSETRON 4 MG PO TBDP: 4.0000 mg | ORAL_TABLET | Freq: Three times a day (TID) | ORAL | 0 refills | Status: DC | PRN

## 2022-01-10 NOTE — ED Notes (Signed)
First Nurse Note: Pt to ED via POV, pt states that she has had a migraine for 2 days.

## 2022-01-10 NOTE — ED Provider Notes (Signed)
[facility] Provider Note    Event Date/Time   First MD Initiated Contact with Patient 01/10/22 1404     (approximate)   History   Headache   HPI  Tracey Perez is a 56 y.o. female presents to the ER for evaluation of sudden onset headache last evening sudden onset diffuse tightness feeling in her head.  Has a history of migraine headaches this does feel worse than prior.  She is on Eliquis.  No fevers or chills no cough or congestion no blurry vision.  Bright light does bother her.     Physical Exam   Triage Vital Signs: ED Triage Vitals  Enc Vitals Group     BP 01/10/22 1256 (!) 148/98     Pulse Rate 01/10/22 1256 77     Resp 01/10/22 1256 16     Temp 01/10/22 1256 98.5 F (36.9 C)     Temp Source 01/10/22 1256 Oral     SpO2 01/10/22 1256 95 %     Weight 01/10/22 1301 118 lb (53.5 kg)     Height 01/10/22 1301 '5\' 4"'$  (1.626 m)     Head Circumference --      Peak Flow --      Pain Score 01/10/22 1301 10     Pain Loc --      Pain Edu? --      Excl. in [place]? --     Most recent vital signs: Vitals:   01/10/22 1430 01/10/22 1500  BP: (!) 147/94 (!) 142/88  Pulse: 88 92  Resp: 18 18  Temp:    SpO2: 99% 99%     Constitutional: Alert  Eyes: Conjunctivae are normal.  Head: Atraumatic. Nose: No congestion/rhinnorhea. Mouth/Throat: Mucous membranes are moist.   Neck: Painless ROM.  Cardiovascular:   Good peripheral circulation. Respiratory: Normal respiratory effort.  No retractions.  Gastrointestinal: Soft and nontender.  Musculoskeletal:  no deformity Neurologic:  MAE spontaneously. No gross focal neurologic deficits are appreciated.  Skin:  Skin is warm, dry and intact. No rash noted. Psychiatric: Mood and affect are normal. Speech and behavior are normal.    ED Results / Procedures / Treatments   Labs (all labs ordered are listed, but only abnormal results are displayed) Labs Reviewed  BASIC METABOLIC PANEL - Abnormal;  Notable for the following components:      Result Value   Potassium 3.3 (*)    Glucose, Bld 102 (*)    All other components within normal limits  CBC WITH DIFFERENTIAL/PLATELET     EKG     RADIOLOGY Please see ED Course for my review and interpretation.  I personally reviewed all radiographic images ordered to evaluate for the above acute complaints and reviewed radiology reports and findings.  These findings were personally discussed with the patient.  Please see medical record for radiology report.    PROCEDURES:  Critical Care performed:   Procedures   MEDICATIONS ORDERED IN ED: Medications  sodium chloride 0.9 % bolus 500 mL (500 mLs Intravenous New Bag/Given 01/10/22 1506)  prochlorperazine (COMPAZINE) injection 10 mg (10 mg Intravenous Given 01/10/22 1502)  diphenhydrAMINE (BENADRYL) injection 12.5 mg (12.5 mg Intravenous Given 01/10/22 1501)  iohexol (OMNIPAQUE) 350 MG/ML injection 75 mL (75 mLs Intravenous Contrast Given 01/10/22 1521)     IMPRESSION / MDM / ASSESSMENT AND PLAN / ED COURSE  I reviewed the triage vital signs and the nursing notes.  Differential diagnosis includes, but is not limited to, migraine, tension, SDH, IPH, SAH, tension  She presented to the ER for evaluation of symptoms as described above.  This presenting complaint could reflect a potentially life-threatening illness therefore the patient will be placed on continuous pulse oximetry and telemetry for monitoring.  Laboratory evaluation will be sent to evaluate for the above complaints.  The imaging out of triage ordered for blood differential on my review and interpretation does not show any evidence of IPH or bleed.  Given her sudden onset headache and symptoms will order CTA to rule out aneurysm feel is clinically most likely tension or migraine will order migraine cocktail for symptomatic relief.   Clinical Course as of 01/10/22 1548  Fri Jan 10, 2022  1546  Patient reassessed.  Patient states that the pain is improved.  No infectious symptoms.  She is requesting for something additional for nausea but denies any abdominal pain.  Patient be signed out to oncoming physician pending follow-up CTA.  If no sign of aneurysm or acute abnormality I do believe she will be appropriate for outpatient follow-up. [PR]    Clinical Course User Index [PR] Merlyn Lot, MD    FINAL CLINICAL IMPRESSION(S) / ED DIAGNOSES   Final diagnoses:  Bad headache     Rx / DC Orders   ED Discharge Orders          Ordered    ondansetron (ZOFRAN-ODT) 4 MG disintegrating tablet  Every 8 hours PRN        01/10/22 1547             Note:  This document was prepared using Dragon voice recognition software and may include unintentional dictation errors.    Merlyn Lot, MD 01/10/22 [phone]

## 2022-01-10 NOTE — ED Triage Notes (Addendum)
Pt arrives with c/o migraine that started yesterday. Pt endorses nausea and light sensitivity. Pt has hx of migraines. Pt takes eliquis.

## 2022-02-14 ENCOUNTER — Encounter: Payer: Self-pay | Admitting: Nurse Practitioner

## 2022-03-06 ENCOUNTER — Other Ambulatory Visit: Payer: Self-pay | Admitting: Nurse Practitioner

## 2022-03-06 DIAGNOSIS — E890 Postprocedural hypothyroidism: Secondary | ICD-10-CM

## 2022-03-06 NOTE — Telephone Encounter (Signed)
Requested Prescriptions  Pending Prescriptions Disp Refills   levothyroxine (SYNTHROID) 75 MCG tablet [Pharmacy Med Name: LEVOTHYROXINE 0.'075MG'$  (75MCG) TABS] 90 tablet 0    Sig: TAKE 1 TABLET BY MOUTH EVERY DAY BEFORE BREAKFAST     Endocrinology:  Hypothyroid Agents Passed - 03/06/2022 10:43 AM      Passed - TSH in normal range and within 360 days    TSH  Date Value Ref Range Status  09/04/2021 1.050 0.450 - 4.500 uIU/mL Final         Passed - Valid encounter within last 12 months    Recent Outpatient Visits           2 months ago Unintended weight loss   [facility] [place], [place] T, NP   4 months ago Unintended weight loss   [facility], [place] T, NP   6 months ago Current moderate episode of major depressive disorder without prior episode ([place])   [place], Jolene T, NP   10 months ago History of DVT (deep vein thrombosis)   [place]ady, Jolene T, NP   12 months ago Current moderate episode of major depressive disorder without prior episode ([place])   [place], Barbaraann Faster, NP       Future Appointments             In 4 days [place], Tally Due, MD [place]   In 2 weeks [place], Barbaraann Faster, NP [facility]

## 2022-03-10 ENCOUNTER — Ambulatory Visit: Payer: Commercial Managed Care - PPO | Admitting: Gastroenterology

## 2022-03-18 ENCOUNTER — Other Ambulatory Visit: Payer: Self-pay | Admitting: Nurse Practitioner

## 2022-03-18 NOTE — Telephone Encounter (Signed)
Requested Prescriptions  Pending Prescriptions Disp Refills   ELIQUIS 5 MG TABS tablet [Pharmacy Med Name: ELIQUIS '5MG'$  TABLETS] 180 tablet 0    Sig: TAKE 1 TABLET(5 MG) BY MOUTH TWICE DAILY     Hematology:  Anticoagulants - apixaban Failed - 03/18/2022  7:02 AM      Failed - AST in normal range and within 360 days    AST  Date Value Ref Range Status  03/07/2021 18 0 - 40 IU/L Final         Failed - ALT in normal range and within 360 days    ALT  Date Value Ref Range Status  03/07/2021 14 0 - 32 IU/L Final         Passed - PLT in normal range and within 360 days    Platelets  Date Value Ref Range Status  01/10/2022 335 150 - 400 K/uL Final  09/04/2021 265 150 - 450 x10E3/uL Final         Passed - HGB in normal range and within 360 days    Hemoglobin  Date Value Ref Range Status  01/10/2022 13.5 12.0 - 15.0 g/dL Final  09/04/2021 12.0 11.1 - 15.9 g/dL Final         Passed - HCT in normal range and within 360 days    HCT  Date Value Ref Range Status  01/10/2022 39.2 36.0 - 46.0 % Final   Hematocrit  Date Value Ref Range Status  09/04/2021 36.1 34.0 - 46.6 % Final         Passed - Cr in normal range and within 360 days    Creatinine, Ser  Date Value Ref Range Status  01/10/2022 0.80 0.44 - 1.00 mg/dL Final         Passed - Valid encounter within last 12 months    Recent Outpatient Visits           2 months ago Unintended weight loss   [place], [place] T, NP   4 months ago Unintended weight loss   [employer], [place] T, NP   6 months ago Current moderate episode of major depressive disorder without prior episode ([place])   [place], Jolene T, NP   10 months ago History of DVT (deep vein thrombosis)   [place], Jolene T, NP   1 year ago Current moderate episode of major depressive disorder without prior episode ([place])   [place], Barbaraann Faster, NP        Future Appointments             In 6 days Cannady, Barbaraann Faster, NP [facility]

## 2022-03-20 ENCOUNTER — Other Ambulatory Visit: Payer: Self-pay | Admitting: Nurse Practitioner

## 2022-03-21 ENCOUNTER — Other Ambulatory Visit: Payer: Self-pay | Admitting: Nurse Practitioner

## 2022-03-21 ENCOUNTER — Ambulatory Visit: Payer: Commercial Managed Care - PPO | Admitting: Nurse Practitioner

## 2022-03-23 NOTE — Patient Instructions (Signed)

## 2022-03-24 ENCOUNTER — Ambulatory Visit: Payer: Commercial Managed Care - PPO | Admitting: Nurse Practitioner

## 2022-03-24 ENCOUNTER — Encounter: Payer: Self-pay | Admitting: Nurse Practitioner

## 2022-03-24 VITALS — BP 130/82 | HR 84 | Temp 98.2°F | Ht 64.02 in | Wt 117.1 lb

## 2022-03-24 DIAGNOSIS — D6869 Other thrombophilia: Secondary | ICD-10-CM | POA: Diagnosis not present

## 2022-03-24 DIAGNOSIS — E876 Hypokalemia: Secondary | ICD-10-CM

## 2022-03-24 DIAGNOSIS — E782 Mixed hyperlipidemia: Secondary | ICD-10-CM | POA: Diagnosis not present

## 2022-03-24 DIAGNOSIS — F321 Major depressive disorder, single episode, moderate: Secondary | ICD-10-CM | POA: Diagnosis not present

## 2022-03-24 DIAGNOSIS — R7989 Other specified abnormal findings of blood chemistry: Secondary | ICD-10-CM

## 2022-03-24 DIAGNOSIS — D508 Other iron deficiency anemias: Secondary | ICD-10-CM

## 2022-03-24 DIAGNOSIS — F5104 Psychophysiologic insomnia: Secondary | ICD-10-CM

## 2022-03-24 DIAGNOSIS — E890 Postprocedural hypothyroidism: Secondary | ICD-10-CM

## 2022-03-24 DIAGNOSIS — Z86711 Personal history of pulmonary embolism: Secondary | ICD-10-CM

## 2022-03-24 DIAGNOSIS — R7303 Prediabetes: Secondary | ICD-10-CM

## 2022-03-24 DIAGNOSIS — G894 Chronic pain syndrome: Secondary | ICD-10-CM

## 2022-03-24 DIAGNOSIS — Z23 Encounter for immunization: Secondary | ICD-10-CM

## 2022-03-24 DIAGNOSIS — I10 Essential (primary) hypertension: Secondary | ICD-10-CM

## 2022-03-24 DIAGNOSIS — Z86718 Personal history of other venous thrombosis and embolism: Secondary | ICD-10-CM

## 2022-03-24 NOTE — Assessment & Plan Note (Addendum)
Noted on recent labs, followed by pulmonary in past.  Will continue to monitor.  Suspected to be related to hiatal hernia repair in past.

## 2022-03-24 NOTE — Assessment & Plan Note (Signed)
Chronic, ongoing, continues on Ambien which is written for by Dr. Holland Falling at [facility] in [facility] -- both psychiatry and pain management provider.  Continue this collaboration and current medications as prescribed by him.

## 2022-03-24 NOTE — Assessment & Plan Note (Addendum)
Chronic, ongoing with lifelong need for anticoagulation.  Continue Eliquis and send refills as needed.  Monitor CBC regularly and kidney function.

## 2022-03-24 NOTE — Assessment & Plan Note (Signed)
Noted on past labs, is working on diet.  Recheck level today.

## 2022-03-24 NOTE — Assessment & Plan Note (Signed)
ASCVD 2.8%, continue to monitor labs and focus on diet changes at home.  May benefit from medication in future -- dependent on family history and overall CAD risk.

## 2022-03-24 NOTE — Assessment & Plan Note (Signed)
Chronic, stable with history of right lobectomy 08/07/2019.  At this time will monitor thyroid levels in office and adjust dose of Levothyroxine as needed.  Labs: TSH and Free T4 today.  She does take Biotin at home, educated her on this, if labs off will recommend recheck without Biotin on board.

## 2022-03-24 NOTE — Assessment & Plan Note (Signed)
In 2005.  Chronic, ongoing with lifelong need for anticoagulation.  Continue Eliquis and send refills as needed.  Monitor CBC regularly and kidney function.

## 2022-03-24 NOTE — Assessment & Plan Note (Signed)
Chronic, ongoing, followed by Dr. Holland Falling at [facility] in [place].  Continue this collaboration and medication as ordered by him, she is aware PCP does not perform chronic pain management and refills must come from pain management provider.

## 2022-03-24 NOTE — Assessment & Plan Note (Signed)
A1c 5.4% last check, well below prediabetes level.  Continue current diet and exercise focus.  Urine ALB 30, allergic reaction in ACE in past, could consider ARB trial in future.

## 2022-03-24 NOTE — Assessment & Plan Note (Signed)
On lifelong Eliquis due to history of bilateral PE and history of DVT.  Monitor CBC regularly and monitor for increased bruising or bleeding.  Return to hematology as needed.

## 2022-03-24 NOTE — Assessment & Plan Note (Addendum)
Chronic, ongoing with history of childhood trauma.  Denies SI/HI.  Followed by Dr. Holland Falling at [facility] in [facility] for both psychiatry and pain management.  Continue this collaboration and medication as ordered by him, she is aware PCP does not perform chronic pain management and refills must come from pain management provider for all current psychiatric medications and pain medication.  Agrees with this plan of care.
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
## 2022-03-24 NOTE — Assessment & Plan Note (Signed)
Chronic, stable.  BP at goal today.  Takes Hydralazine in morning.  Recommend she monitor BP at least a few mornings a week at home and document.  DASH diet at home.  Labs: CBC, CMP, lipid.  Return in 6 months.

## 2022-03-24 NOTE — Assessment & Plan Note (Signed)
History of, with recent levels improved.  Will continue to monitor and return to hematology as needed based on labs.  Obtain labs today.

## 2022-03-25 ENCOUNTER — Other Ambulatory Visit: Payer: Self-pay | Admitting: Nurse Practitioner

## 2022-03-25 DIAGNOSIS — E876 Hypokalemia: Secondary | ICD-10-CM

## 2022-03-25 LAB — LIPID PANEL W/O CHOL/HDL RATIO
Cholesterol, Total: 173 mg/dL (ref 100–199)
HDL: 60 mg/dL (ref >39)
LDL Chol Calc (NIH): 95 mg/dL (ref 0–99)
Triglycerides: 99 mg/dL (ref 0–149)
VLDL Cholesterol Cal: 18 mg/dL (ref 5–40)

## 2022-03-25 LAB — CBC WITH DIFFERENTIAL/PLATELET
Basophils Absolute: 0 10*3/uL (ref 0.0–0.2)
Basos: 0 %
EOS (ABSOLUTE): 0.2 10*3/uL (ref 0.0–0.4)
Eos: 3 %
Hematocrit: 39.1 % (ref 34.0–46.6)
Hemoglobin: 12.5 g/dL (ref 11.1–15.9)
Immature Grans (Abs): 0 10*3/uL (ref 0.0–0.1)
Immature Granulocytes: 0 %
Lymphocytes Absolute: 2.5 10*3/uL (ref 0.7–3.1)
Lymphs: 34 %
MCH: 30.1 pg (ref 26.6–33.0)
MCHC: 32 g/dL (ref 31.5–35.7)
MCV: 94 fL (ref 79–97)
Monocytes Absolute: 0.3 10*3/uL (ref 0.1–0.9)
Monocytes: 5 %
Neutrophils Absolute: 4.2 10*3/uL (ref 1.4–7.0)
Neutrophils: 58 %
Platelets: 336 10*3/uL (ref 150–450)
RBC: 4.15 x10E6/uL (ref 3.77–5.28)
RDW: 13.6 % (ref 11.7–15.4)
WBC: 7.2 10*3/uL (ref 3.4–10.8)

## 2022-03-25 LAB — COMPREHENSIVE METABOLIC PANEL
ALT: 11 IU/L (ref 0–32)
AST: 14 IU/L (ref 0–40)
Albumin/Globulin Ratio: 2.4 — ABNORMAL HIGH (ref 1.2–2.2)
Albumin: 4.5 g/dL (ref 3.8–4.9)
Alkaline Phosphatase: 142 IU/L — ABNORMAL HIGH (ref 44–121)
BUN/Creatinine Ratio: 13 (ref 9–23)
BUN: 11 mg/dL (ref 6–24)
Bilirubin Total: 0.2 mg/dL (ref 0.0–1.2)
CO2: 26 mmol/L (ref 20–29)
Calcium: 10 mg/dL (ref 8.7–10.2)
Chloride: 101 mmol/L (ref 96–106)
Creatinine, Ser: 0.84 mg/dL (ref 0.57–1.00)
Globulin, Total: 1.9 g/dL (ref 1.5–4.5)
Glucose: 83 mg/dL (ref 70–99)
Potassium: 2.9 mmol/L — ABNORMAL LOW (ref 3.5–5.2)
Sodium: 138 mmol/L (ref 134–144)
Total Protein: 6.4 g/dL (ref 6.0–8.5)
eGFR: 81 mL/min/{1.73_m2} (ref >59)

## 2022-03-25 LAB — IRON AND TIBC
Iron Saturation: 20 % (ref 15–55)
Iron: 53 ug/dL (ref 27–159)
Total Iron Binding Capacity: 269 ug/dL (ref 250–450)
UIBC: 216 ug/dL (ref 131–425)

## 2022-03-25 LAB — T4, FREE: Free T4: 1.42 ng/dL (ref 0.82–1.77)

## 2022-03-25 LAB — TSH: TSH: 4.2 u[IU]/mL (ref 0.450–4.500)

## 2022-03-25 LAB — FERRITIN: Ferritin: 207 ng/mL — ABNORMAL HIGH (ref 15–150)

## 2022-03-25 MED ORDER — POTASSIUM CHLORIDE CRYS ER 20 MEQ PO TBCR: 20.0000 meq | EXTENDED_RELEASE_TABLET | Freq: Every day | ORAL | 0 refills | Status: DC

## 2022-03-25 NOTE — Telephone Encounter (Signed)
Requested medication (s) are due for refill today: for 90 days  Requested medication (s) are on the active medication list: yes  Last refill:  03/25/22  Future visit scheduled: yes  Notes to clinic:  Pharmacy request 90 days, routing for review     Requested Prescriptions  Pending Prescriptions Disp Refills   potassium chloride SA (KLOR-CON M) 20 MEQ tablet [Pharmacy Med Name: POTASSIUM CL 20MEQ ER TABLETS] 90 tablet     Sig: TAKE 1 TABLET(20 MEQ) BY MOUTH DAILY     Endocrinology:  Minerals - Potassium Supplementation Failed - 03/25/2022 10:28 AM      Failed - K in normal range and within 360 days    Potassium  Date Value Ref Range Status  03/24/2022 2.9 (L) 3.5 - 5.2 mmol/L Final         Passed - Cr in normal range and within 360 days    Creatinine, Ser  Date Value Ref Range Status  03/24/2022 0.84 0.57 - 1.00 mg/dL Final         Passed - Valid encounter within last 12 months    Recent Outpatient Visits           Yesterday Current moderate episode of major depressive disorder without prior episode ([place])   [place], Jolene T, NP   3 months ago Unintended weight loss   [employer], [place] T, NP   5 months ago Unintended weight loss   [employer], [place] T, NP   6 months ago Current moderate episode of major depressive disorder without prior episode ([place])   [place], Jolene T, NP   11 months ago History of DVT (deep vein thrombosis)   [place], Barbaraann Faster, NP       Future Appointments             In 6 months Cannady, Barbaraann Faster, NP [employer], [facility]

## 2022-03-25 NOTE — Progress Notes (Signed)
Contacted via [place] -- needs lab visit only in one week and alert her potassium prescription was sent in. Good morning Tracey Perez, your labs have returned: - Kidney function, creatinine and eGFR, remains normal, as is liver function, AST and ALT.  - Potassium remains low, I am sending in Potassium 20 MEQ I wish you to start taking daily.  I would like you to return in one week for lab check only to see if we need to adjust this dosing.  Current level is 2.9 and low levels + high levels can cause heart issues, which we do not want:) - Remainder of labs are stable and baseline for you.  Any questions? Keep being amazing!!  Thank you for allowing me to participate in your care.  I appreciate you. Kindest regards, Eidan Muellner

## 2022-04-03 ENCOUNTER — Other Ambulatory Visit: Payer: Commercial Managed Care - PPO

## 2022-04-03 DIAGNOSIS — E876 Hypokalemia: Secondary | ICD-10-CM

## 2022-04-04 ENCOUNTER — Other Ambulatory Visit: Payer: Self-pay | Admitting: Nurse Practitioner

## 2022-04-04 LAB — POTASSIUM: Potassium: 3.5 mmol/L (ref 3.5–5.2)

## 2022-04-04 MED ORDER — POTASSIUM CHLORIDE CRYS ER 20 MEQ PO TBCR: 20.0000 meq | EXTENDED_RELEASE_TABLET | Freq: Every day | ORAL | 0 refills | Status: DC

## 2022-04-04 NOTE — Progress Notes (Signed)
Contacted via MyChart   Good morning Tracey Perez, your potassium level has returned in normal range with addition of supplement.  I would continue potassium supplement for now and we will recheck next visit. Also ensure good potassium level in diet.  Any questions? Keep being amazing!!  Thank you for allowing me to participate in your care.  I appreciate you. Kindest regards, Jahmez Bily

## 2022-05-30 ENCOUNTER — Other Ambulatory Visit: Payer: Self-pay | Admitting: Nurse Practitioner

## 2022-05-30 DIAGNOSIS — E890 Postprocedural hypothyroidism: Secondary | ICD-10-CM

## 2022-05-30 NOTE — Telephone Encounter (Signed)
Requested Prescriptions  Pending Prescriptions Disp Refills   levothyroxine (SYNTHROID) 75 MCG tablet [Pharmacy Med Name: LEVOTHYROXINE 0.075MG (75MCG) TABS] 90 tablet 0    Sig: TAKE 1 TABLET BY MOUTH EVERY DAY BEFORE BREAKFAST     Endocrinology:  Hypothyroid Agents Passed - 05/30/2022 10:41 AM      Passed - TSH in normal range and within 360 days    TSH  Date Value Ref Range Status  03/24/2022 4.200 0.450 - 4.500 uIU/mL Final         Passed - Valid encounter within last 12 months    Recent Outpatient Visits           2 months ago Current moderate episode of major depressive disorder without prior episode ([place])   [place], [place] T, NP   5 months ago Unintended weight loss   [place], [place] T, NP   7 months ago Unintended weight loss   [place], [place] T, NP   8 months ago Current moderate episode of major depressive disorder without prior episode ([place])   [place], Henrine Screws T, NP   1 year ago History of DVT (deep vein thrombosis)   [place], Barbaraann Faster, NP       Future Appointments             In 3 months Cannady, Barbaraann Faster, NP [place], [facility]

## 2022-06-11 ENCOUNTER — Other Ambulatory Visit: Payer: Self-pay | Admitting: Nurse Practitioner

## 2022-06-11 NOTE — Telephone Encounter (Signed)
Requested Prescriptions  Pending Prescriptions Disp Refills   ELIQUIS 5 MG TABS tablet [Pharmacy Med Name: ELIQUIS 5MG TABLETS] 180 tablet 3    Sig: TAKE 1 TABLET(5 MG) BY MOUTH TWICE DAILY     Hematology:  Anticoagulants - apixaban Passed - 06/11/2022  7:08 AM      Passed - PLT in normal range and within 360 days    Platelets  Date Value Ref Range Status  03/24/2022 336 150 - 450 x10E3/uL Final         Passed - HGB in normal range and within 360 days    Hemoglobin  Date Value Ref Range Status  03/24/2022 12.5 11.1 - 15.9 g/dL Final         Passed - HCT in normal range and within 360 days    Hematocrit  Date Value Ref Range Status  03/24/2022 39.1 34.0 - 46.6 % Final         Passed - Cr in normal range and within 360 days    Creatinine, Ser  Date Value Ref Range Status  03/24/2022 0.84 0.57 - 1.00 mg/dL Final         Passed - AST in normal range and within 360 days    AST  Date Value Ref Range Status  03/24/2022 14 0 - 40 IU/L Final         Passed - ALT in normal range and within 360 days    ALT  Date Value Ref Range Status  03/24/2022 11 0 - 32 IU/L Final         Passed - Valid encounter within last 12 months    Recent Outpatient Visits           2 months ago Current moderate episode of major depressive disorder without prior episode ([place])   [place], Jolene T, NP   5 months ago Unintended weight loss   [place], [place] T, NP   7 months ago Unintended weight loss   [place], [place] T, NP   9 months ago Current moderate episode of major depressive disorder without prior episode ([place])   [place], Henrine Screws T, NP   1 year ago History of DVT (deep vein thrombosis)   [place], Barbaraann Faster, NP       Future Appointments             In 3 months Cannady, Barbaraann Faster, NP [place], [facility]

## 2022-08-06 ENCOUNTER — Other Ambulatory Visit: Payer: Self-pay | Admitting: Nurse Practitioner

## 2022-08-06 DIAGNOSIS — I10 Essential (primary) hypertension: Secondary | ICD-10-CM

## 2022-08-07 NOTE — Telephone Encounter (Signed)
Unable to refill per protocol, Rx request is too soon. Last refill 07/31/21 for 90 and 4 refills.  Requested Prescriptions  Pending Prescriptions Disp Refills   hydrALAZINE (APRESOLINE) 25 MG tablet [Pharmacy Med Name: HYDRALAZINE   TABLETS(ORANGE)] 90 tablet 4    Sig: TAKE 1 TABLET BY MOUTH EVERY DAY     Cardiovascular:  Vasodilators Failed - 08/06/2022  9:15 PM      Failed - ANA Screen, Ifa, Serum in normal range and within 360 days    No results found for: "ANA", "ANATITER", "LABANTI"       Passed - HCT in normal range and within 360 days    Hematocrit  Date Value Ref Range Status  03/24/2022 39.1 34.0 - 46.6 % Final         Passed - HGB in normal range and within 360 days    Hemoglobin  Date Value Ref Range Status  03/24/2022 12.5 11.1 - 15.9 g/dL Final         Passed - RBC in normal range and within 360 days    RBC  Date Value Ref Range Status  03/24/2022 4.15 3.77 - 5.28 x10E6/uL Final  01/10/2022 4.46 3.87 - 5.11 MIL/uL Final         Passed - WBC in normal range and within 360 days    WBC  Date Value Ref Range Status  03/24/2022 7.2 3.4 - 10.8 x10E3/uL Final  01/10/2022 6.2 4.0 - 10.5 K/uL Final         Passed - PLT in normal range and within 360 days    Platelets  Date Value Ref Range Status  03/24/2022 336 150 - 450 x10E3/uL Final         Passed - Last BP in normal range    BP Readings from Last 1 Encounters:  03/24/22 130/82         Passed - Valid encounter within last 12 months    Recent Outpatient Visits           4 months ago Current moderate episode of major depressive disorder without prior episode (HCC)   [place] [facility] [place], Jolene T, NP   7 months ago Unintended weight loss   [place] [facility] [place], [place] T, NP   9 months ago Unintended weight loss   [facility] [place], [place] T, NP   11 months ago Current moderate episode of major depressive disorder without prior  episode (HCC)   [place] [facility] [place], Corrie Dandy T, NP   1 year ago History of DVT (deep vein thrombosis)   [place] [facility] [place], Dorie Rank, NP       Future Appointments             In 1 month Cannady, Dorie Rank, NP [place] [facility], [facility]

## 2022-08-11 ENCOUNTER — Telehealth: Payer: Self-pay

## 2022-08-11 NOTE — Telephone Encounter (Signed)
PA started for Eliquis 5 mg BID  through [facility] . Awaiting on determination  PA ID [id number]

## 2022-08-20 ENCOUNTER — Encounter: Payer: Self-pay | Admitting: Nurse Practitioner

## 2022-08-28 ENCOUNTER — Other Ambulatory Visit: Payer: Self-pay | Admitting: Nurse Practitioner

## 2022-08-28 DIAGNOSIS — E890 Postprocedural hypothyroidism: Secondary | ICD-10-CM

## 2022-08-28 NOTE — Telephone Encounter (Signed)
Requested Prescriptions  Pending Prescriptions Disp Refills   levothyroxine (SYNTHROID) 75 MCG tablet [Pharmacy Med Name: LEVOTHYROXINE 0.075MG  ( ) TABS] 90 tablet 0    Sig: TAKE 1 TABLET BY MOUTH EVERY DAY BEFORE BREAKFAST     Endocrinology:  Hypothyroid Agents Passed - 08/28/2022 10:37 AM      Passed - TSH in normal range and within 360 days    TSH  Date Value Ref Range Status  03/24/2022 4.200 0.450 - 4.500 uIU/mL Final         Passed - Valid encounter within last 12 months    Recent Outpatient Visits           5 months ago Current moderate episode of major depressive disorder without prior episode (HCC)   [place] [facility] [place], [place] T, NP   8 months ago Unintended weight loss   [place] [facility] [place], [place] T, NP   10 months ago Unintended weight loss   [facility] [place], Ronan T, NP   11 months ago Current moderate episode of major depressive disorder without prior episode (HCC)   [place] [facility] [place], Corrie Dandy T, NP   1 year ago History of DVT (deep vein thrombosis)   [place] [facility] [place], Dorie Rank, NP       Future Appointments             In 1 month Cannady, Dorie Rank, NP [place] [facility], [facility]

## 2022-09-23 ENCOUNTER — Ambulatory Visit: Payer: Commercial Managed Care - PPO | Admitting: Nurse Practitioner

## 2022-09-28 NOTE — Patient Instructions (Signed)
Be Involved in Your Health Care:  Taking Medications When medications are taken as directed, they can greatly improve your health. But if they are not taken as instructed, they may not work. In some cases, not taking them correctly can be harmful. To help ensure your treatment remains effective and safe, understand your medications and how to take them.  Your lab results, notes and after visit summary will be available on My Chart. We strongly encourage you to use this feature. If lab results are abnormal the clinic will contact you with the appropriate steps. If the clinic does not contact you assume the results are satisfactory. You can always see your results on My Chart. If you have questions regarding your condition, please contact the clinic during office hours. You can also ask questions on My Chart.  We at [facility] are grateful that you chose us to provide care. We strive to provide excellent and compassionate care and are always looking for feedback. If you get a survey from the clinic please complete this.   DASH Eating Plan DASH stands for Dietary Approaches to Stop Hypertension. The DASH eating plan is a healthy eating plan that has been shown to: Lower high blood pressure (hypertension). Reduce your risk for type 2 diabetes, heart disease, and stroke. Help with weight loss. What are tips for following this plan? Reading food labels Check food labels for the amount of salt (sodium) per serving. Choose foods with less than 5 percent of the Daily Value (DV) of sodium. In general, foods with less than 300 milligrams (mg) of sodium per serving fit into this eating plan. To find whole grains, look for the word "whole" as the first word in the ingredient list. Shopping Buy products labeled as "low-sodium" or "no salt added." Buy fresh foods. Avoid canned foods and pre-made or frozen meals. Cooking Try not to add salt when you cook. Use salt-free seasonings or herbs instead  of table salt or sea salt. Check with your health care provider or pharmacist before using salt substitutes. Do not fry foods. Cook foods in healthy ways, such as baking, boiling, grilling, roasting, or broiling. Cook using oils that are good for your heart. These include olive, canola, avocado, soybean, and sunflower oil. Meal planning  Eat a balanced diet. This should include: 4 or more servings of fruits and 4 or more servings of vegetables each day. Try to fill half of your plate with fruits and vegetables. 6-8 servings of whole grains each day. 6 or less servings of lean meat, poultry, or fish each day. 1 oz is 1 serving. A 3 oz (85 g) serving of meat is about the same size as the palm of your hand. One egg is 1 oz (28 g). 2-3 servings of low-fat dairy each day. One serving is 1 cup (237 mL). 1 serving of nuts, seeds, or beans 5 times each week. 2-3 servings of heart-healthy fats. Healthy fats called omega-3 fatty acids are found in foods such as walnuts, flaxseeds, fortified milks, and eggs. These fats are also found in cold-water fish, such as sardines, salmon, and mackerel. Limit how much you eat of: Canned or prepackaged foods. Food that is high in trans fat, such as fried foods. Food that is high in saturated fat, such as fatty meat. Desserts and other sweets, sugary drinks, and other foods with added sugar. Full-fat dairy products. Do not salt foods before eating. Do not eat more than 4 egg yolks a week. Try to   eat at least 2 vegetarian meals a week. Eat more home-cooked food and less restaurant, buffet, and fast food. Lifestyle When eating at a restaurant, ask if your food can be made with less salt or no salt. If you drink alcohol: Limit how much you have to: 0-1 drink a day if you are female. 0-2 drinks a day if you are female. Know how much alcohol is in your drink. In the [place] one drink is one 12 oz bottle of beer (355 mL), one 5 oz glass of wine (148 mL), or one 1 oz  glass of hard liquor (44 mL). General information Avoid eating more than 2,300 mg of salt a day. If you have hypertension, you may need to reduce your sodium intake to 1,500 mg a day. Work with your provider to stay at a healthy body weight or lose weight. Ask what the best weight range is for you. On most days of the week, get at least 30 minutes of exercise that causes your heart to beat faster. This may include walking, swimming, or biking. Work with your provider or dietitian to adjust your eating plan to meet your specific calorie needs. What foods should I eat? Fruits All fresh, dried, or frozen fruit. Canned fruits that are in their natural juice and do not have sugar added to them. Vegetables Fresh or frozen vegetables that are raw, steamed, roasted, or grilled. Low-sodium or reduced-sodium tomato and vegetable juice. Low-sodium or reduced-sodium tomato sauce and tomato paste. Low-sodium or reduced-sodium canned vegetables. Grains Whole-grain or whole-wheat bread. Whole-grain or whole-wheat pasta. Brown rice. Oatmeal. Quinoa. Bulgur. Whole-grain and low-sodium cereals. Pita bread. Low-fat, low-sodium crackers. Whole-wheat flour tortillas. Meats and other proteins Skinless chicken or turkey. Ground chicken or turkey. Pork with fat trimmed off. Fish and seafood. Egg whites. Dried beans, peas, or lentils. Unsalted nuts, nut butters, and seeds. Unsalted canned beans. Lean cuts of beef with fat trimmed off. Low-sodium, lean precooked or cured meat, such as sausages or meat loaves. Dairy Low-fat (1%) or fat-free (skim) milk. Reduced-fat, low-fat, or fat-free cheeses. Nonfat, low-sodium ricotta or cottage cheese. Low-fat or nonfat yogurt. Low-fat, low-sodium cheese. Fats and oils Soft margarine without trans fats. Vegetable oil. Reduced-fat, low-fat, or light mayonnaise and salad dressings (reduced-sodium). Canola, safflower, olive, avocado, soybean, and sunflower oils. Avocado. Seasonings and  condiments Herbs. Spices. Seasoning mixes without salt. Other foods Unsalted popcorn and pretzels. Fat-free sweets. The items listed above may not be all the foods and drinks you can have. Talk to a dietitian to learn more. What foods should I avoid? Fruits Canned fruit in a light or heavy syrup. Fried fruit. Fruit in cream or butter sauce. Vegetables Creamed or fried vegetables. Vegetables in a cheese sauce. Regular canned vegetables that are not marked as low-sodium or reduced-sodium. Regular canned tomato sauce and paste that are not marked as low-sodium or reduced-sodium. Regular tomato and vegetable juices that are not marked as low-sodium or reduced-sodium. Pickles. Olives. Grains Baked goods made with fat, such as croissants, muffins, or some breads. Dry pasta or rice meal packs. Meats and other proteins Fatty cuts of meat. Ribs. Fried meat. Bacon. Bologna, salami, and other precooked or cured meats, such as sausages or meat loaves, that are not lean and low in sodium. Fat from the back of a pig (fatback). Bratwurst. Salted nuts and seeds. Canned beans with added salt. Canned or smoked fish. Whole eggs or egg yolks. Chicken or turkey with skin. Dairy Whole or 2% milk, cream, and   half-and-half. Whole or full-fat cream cheese. Whole-fat or sweetened yogurt. Full-fat cheese. Nondairy creamers. Whipped toppings. Processed cheese and cheese spreads. Fats and oils Butter. Stick margarine. Lard. Shortening. Ghee. Bacon fat. Tropical oils, such as coconut, palm kernel, or palm oil. Seasonings and condiments Onion salt, garlic salt, seasoned salt, table salt, and sea salt. Worcestershire sauce. Tartar sauce. Barbecue sauce. Teriyaki sauce. Soy sauce, including reduced-sodium soy sauce. Steak sauce. Canned and packaged gravies. Fish sauce. Oyster sauce. Cocktail sauce. Store-bought horseradish. Ketchup. Mustard. Meat flavorings and tenderizers. Bouillon cubes. Hot sauces. Pre-made or packaged  marinades. Pre-made or packaged taco seasonings. Relishes. Regular salad dressings. Other foods Salted popcorn and pretzels. The items listed above may not be all the foods and drinks you should avoid. Talk to a dietitian to learn more. Where to find more information National Heart, Lung, and Blood Institute (NHLBI): nhlbi.nih.gov American Heart Association (AHA): heart.org Academy of Nutrition and Dietetics: eatright.org National Kidney Foundation (NKF): kidney.org This information is not intended to replace advice given to you by your health care provider. Make sure you discuss any questions you have with your health care provider. Document Revised: 04/24/2022 Document Reviewed: 04/24/2022 Elsevier Patient Education  2024 Elsevier Inc.  

## 2022-10-01 ENCOUNTER — Encounter: Payer: Self-pay | Admitting: Nurse Practitioner

## 2022-10-01 ENCOUNTER — Ambulatory Visit: Payer: Commercial Managed Care - PPO | Admitting: Nurse Practitioner

## 2022-10-01 VITALS — BP 128/79 | HR 73 | Temp 98.5°F | Ht 64.02 in | Wt 119.8 lb

## 2022-10-01 DIAGNOSIS — D6869 Other thrombophilia: Secondary | ICD-10-CM

## 2022-10-01 DIAGNOSIS — R7303 Prediabetes: Secondary | ICD-10-CM

## 2022-10-01 DIAGNOSIS — Z86718 Personal history of other venous thrombosis and embolism: Secondary | ICD-10-CM

## 2022-10-01 DIAGNOSIS — E782 Mixed hyperlipidemia: Secondary | ICD-10-CM

## 2022-10-01 DIAGNOSIS — F321 Major depressive disorder, single episode, moderate: Secondary | ICD-10-CM

## 2022-10-01 DIAGNOSIS — E890 Postprocedural hypothyroidism: Secondary | ICD-10-CM

## 2022-10-01 DIAGNOSIS — Z86711 Personal history of pulmonary embolism: Secondary | ICD-10-CM

## 2022-10-01 DIAGNOSIS — I10 Essential (primary) hypertension: Secondary | ICD-10-CM

## 2022-10-01 DIAGNOSIS — F5104 Psychophysiologic insomnia: Secondary | ICD-10-CM

## 2022-10-01 DIAGNOSIS — G894 Chronic pain syndrome: Secondary | ICD-10-CM

## 2022-10-01 LAB — BAYER DCA HB A1C WAIVED: HB A1C (BAYER DCA - WAIVED): 5.4 % (ref 4.8–5.6)

## 2022-10-01 MED ORDER — LEVOTHYROXINE SODIUM 75 MCG PO TABS: ORAL_TABLET | ORAL | 4 refills | Status: DC

## 2022-10-01 NOTE — Assessment & Plan Note (Signed)
Chronic, ongoing, continues on Ambien which is written for by Dr. David Marks at [facility] in [place] -- both psychiatry and pain management provider.  Continue this collaboration and current medications as prescribed by him. 

## 2022-10-01 NOTE — Assessment & Plan Note (Signed)
A1c 5.4% today, well below prediabetes level.  Continue current diet and exercise focus.  Urine ALB 30 last check, allergic reaction in ACE in past, could consider ARB trial in future.

## 2022-10-01 NOTE — Progress Notes (Signed)
BP 128/79   Pulse 73   Temp 98.5 F (36.9 C) (Oral)   Ht 5' 4.02" (1.626 m)   Wt 119 lb 12.8 oz (54.3 kg)   SpO2 97%   BMI 20.55 kg/m    Subjective:    Patient ID: Tracey Perez, female    DOB: May 31, 1965, 57 y.o.   MRN: [id number]  HPI: Tracey Perez is a 57 y.o. female  Chief Complaint  Patient presents with   Hyperlipidemia   Hypertension   Thyroid   HYPERTENSION Off medication and has been stable with diet.   Hypertension status: controlled  Satisfied with current treatment? yes Duration of hypertension: chronic BP monitoring frequency:  not checking BP range:  BP medication side effects:  no Medication compliance: good compliance Aspirin: no Recurrent headaches: no Visual changes: no Palpitations: no Dyspnea: no Chest pain: no Lower extremity edema: no Dizzy/lightheaded: no  The 10-year ASCVD risk score (Arnett DK, et al., 2019) is: 2%   Values used to calculate the score:     Age: 26 years     Sex: Female     Is Non-Hispanic African American: No     Diabetic: No     Tobacco smoker: No     Systolic Blood Pressure: 128 mmHg     Is BP treated: No     HDL Cholesterol: 60 mg/dL     Total Cholesterol: 173 mg/dL  HYPOTHYROIDISM Taking Levothyroxine 75 MCG.  Surgery on thyroid 08/08/2019, removed 1/2 of thyroid due to nodule.    History of prediabetes on labs, last May 5.4%, levels down to 5.1% and 5.6%.   Thyroid control status:stable Satisfied with current treatment? yes Medication side effects: no Medication compliance: good compliance Etiology of hypothyroidism:  Recent dose adjustment:no Fatigue: at baseline Cold intolerance: yes Heat intolerance: no Weight gain: no Weight loss: no Constipation: no Diarrhea/loose stools: no Palpitations: no Lower extremity edema: no Anxiety/depressed mood: no   HISTORY OF PE (BILATERAL) & DVT Saw pulmonary for period, last seen on 07/12/20 -- does not need to return unless needed. Is to continue  Eliquis for PE & DVT history -- DVT in 2002 and PE >2 years ago. Eliquis too costly, $171 a month. Satisfied with current treatment?: yes Dyspnea frequency: none Cough frequency: none Rescue inhaler frequency:  none Limitation of activity: no Productive cough:  Last Spirometry:  Pneumovax: Not up to Date Influenza: Up to Date    DEPRESSION & CHRONIC PAIN Continues on Effexor XR, Mirtazapine, Ambien for sleep.  Sees Dr. Bing Ree -- pain management and psych ([facility]) -- [facility].  He also does talk therapy.  He fills her Oxycodone, Gabapentin, Tizanidine + mental health medications.   Mood status: stable Satisfied with current treatment?: yes Symptom severity: moderate  Duration of current treatment : chronic Side effects: no Medication compliance: good compliance Psychotherapy/counseling: yes current Previous psychiatric medications: multiple Depressed mood: yes Anxious mood: yes Anhedonia: no Significant weight loss or gain: no Insomnia: yes hard to fall asleep -- Ambien Fatigue: yes Feelings of worthlessness or guilt: no Impaired concentration/indecisiveness: no Suicidal ideations: no Hopelessness: no Crying spells: no    10/01/2022    9:36 AM 03/24/2022    8:31 AM 12/20/2021    8:13 AM 10/24/2021    8:58 AM 09/04/2021    9:57 AM  Depression screen PHQ 2/9  Decreased Interest 2 2 2 1 1   Down, Depressed, Hopeless 1 1 2 1 1   PHQ - 2 Score  3 3 4 2 2   Altered sleeping 2 1 2 2 2   Tired, decreased energy 2 2 2 1 1   Change in appetite 0 1 2 1 2   Feeling bad or failure about yourself  2 1 1  0 1  Trouble concentrating 1 0 0 0 1  Moving slowly or fidgety/restless 0 0 0 0 0  Suicidal thoughts 0 0 0 1 0  PHQ-9 Score 10 8 11 7 9   Difficult doing work/chores Somewhat difficult Somewhat difficult Somewhat difficult Somewhat difficult        10/01/2022    9:36 AM 03/24/2022    8:32 AM 12/20/2021    8:13 AM 10/24/2021    8:58 AM  GAD 7 : Generalized Anxiety Score   Nervous, Anxious, on Edge 2 2 2 1   Control/stop worrying 2 0 2 1  Worry too much - different things 1 0 2 1  Trouble relaxing 1 1 2 1   Restless 0 0 2 0  Easily annoyed or irritable 1 2 2 2   Afraid - awful might happen 0 0 1 0  Total GAD 7 Score 7 5 13 6   Anxiety Difficulty Somewhat difficult Somewhat difficult Somewhat difficult Somewhat difficult   Relevant past medical, surgical, family and social history reviewed and updated as indicated. Interim medical history since our last visit reviewed. Allergies and medications reviewed and updated.  Review of Systems  Constitutional:  Positive for fatigue. Negative for activity change, appetite change, diaphoresis and unexpected weight change.  Respiratory:  Negative for cough, chest tightness, shortness of breath and wheezing.   Cardiovascular:  Negative for chest pain, palpitations and leg swelling.  Gastrointestinal: Negative.   Musculoskeletal:  Positive for arthralgias.  Neurological: Negative.   Psychiatric/Behavioral:  Positive for sleep disturbance. Negative for decreased concentration, self-injury and suicidal ideas. The patient is nervous/anxious.    Per HPI unless specifically indicated above     Objective:    BP 128/79   Pulse 73   Temp 98.5 F (36.9 C) (Oral)   Ht 5' 4.02" (1.626 m)   Wt 119 lb 12.8 oz (54.3 kg)   SpO2 97%   BMI 20.55 kg/m   Wt Readings from Last 3 Encounters:  10/01/22 119 lb 12.8 oz (54.3 kg)  03/24/22 117 lb 1.6 oz (53.1 kg)  01/10/22 118 lb (53.5 kg)    Physical Exam Vitals and nursing note reviewed.  Constitutional:      General: She is awake. She is not in acute distress.    Appearance: She is well-developed and well-groomed. She is not ill-appearing or toxic-appearing.  HENT:     Head: Normocephalic.     Right Ear: Hearing normal.     Left Ear: Hearing normal.  Eyes:     General: Lids are normal.        Right eye: No discharge.        Left eye: No discharge.      Conjunctiva/sclera: Conjunctivae normal.     Pupils: Pupils are equal, round, and reactive to light.  Neck:     Thyroid: No thyromegaly.     Vascular: No carotid bruit.  Cardiovascular:     Rate and Rhythm: Normal rate and regular rhythm.     Heart sounds: Normal heart sounds. No murmur heard.    No gallop.  Pulmonary:     Effort: Pulmonary effort is normal. No accessory muscle usage or respiratory distress.     Breath sounds: Normal breath sounds.  Abdominal:  General: Bowel sounds are normal. There is no distension.     Palpations: Abdomen is soft.     Tenderness: There is no abdominal tenderness.  Musculoskeletal:     Cervical back: Normal range of motion and neck supple.     Right lower leg: No edema.     Left lower leg: No edema.  Lymphadenopathy:     Cervical: No cervical adenopathy.  Skin:    General: Skin is warm and dry.  Neurological:     Mental Status: She is alert and oriented to person, place, and time.  Psychiatric:        Attention and Perception: Attention normal.        Mood and Affect: Mood normal.        Speech: Speech normal.        Behavior: Behavior normal. Behavior is cooperative.        Thought Content: Thought content normal.    Results for orders placed or performed in visit on 10/01/22  Bayer DCA Hb A1c Waived  Result Value Ref Range   HB A1C (BAYER DCA - WAIVED) 5.4 4.8 - 5.6 %      Assessment & Plan:   Problem List Items Addressed This Visit       Cardiovascular and Mediastinum   Essential hypertension    Chronic, stable.  BP at goal today.  No further medications, is diet focused only.  Recommend she monitor BP at least a few mornings a week at home and document.  DASH diet at home.  Labs: CMP and urine ALB.  Return in 6 months.       Relevant Orders   Microalbumin, Urine Waived   Comprehensive metabolic panel     Endocrine   Postsurgical hypothyroidism    Chronic, stable with history of right lobectomy 08/07/2019.  At this time  will monitor thyroid levels in office and adjust dose of Levothyroxine as needed.  Labs: up to date, recheck in December 2024, sooner if symptoms present.      Relevant Medications   levothyroxine (SYNTHROID) 75 MCG tablet     Hematopoietic and Hemostatic   Other thrombophilia (HCC)    On lifelong Eliquis due to history of bilateral PE and history of DVT.  Monitor CBC regularly and monitor for increased bruising or bleeding.  Return to hematology as needed.      Relevant Orders   Amb Referral to Clinical Pharmacist     Other   Chronic pain syndrome    Chronic, ongoing, followed by Dr. Bing Ree at [facility] in [place].  Continue this collaboration and medication as ordered by him, she is aware PCP does not perform chronic pain management and refills must come from pain management provider.        Depression - Primary    Chronic, ongoing with history of childhood trauma.  Denies SI/HI.  Followed by Dr. Bing Ree at [facility] in [facility] for both psychiatry and pain management.  Continue this collaboration and medication as ordered by him, she is aware PCP does not perform chronic pain management and refills must come from pain management provider for all current psychiatric medications and pain medication.  Agrees with this plan of care.        History of DVT (deep vein thrombosis)    In 2005.  Chronic, ongoing with lifelong need for anticoagulation.  Continue Eliquis and send refills as needed.  Monitor CBC regularly and kidney function.  Referral to  pharmacist due to cost of medication.      Relevant Orders   Amb Referral to Clinical Pharmacist   History of pulmonary embolism    Chronic, ongoing with lifelong need for anticoagulation.  Continue Eliquis and send refills as needed.  Monitor CBC regularly and kidney function.  Referral to pharmacist due to cost of medication.      Relevant Orders   Amb Referral to Clinical Pharmacist   Insomnia    Chronic, ongoing,  continues on Ambien which is written for by Dr. Bing Ree at [facility] in [facility] -- both psychiatry and pain management provider.  Continue this collaboration and current medications as prescribed by him.      Mixed hyperlipidemia    ASCVD 2%, continue to monitor labs and focus on diet changes at home.  May benefit from medication in future -- dependent on family history and overall CAD risk.      Relevant Orders   Comprehensive metabolic panel   Lipid Panel w/o Chol/HDL Ratio   Prediabetes    A1c 5.4% today, well below prediabetes level.  Continue current diet and exercise focus.  Urine ALB 30 last check, allergic reaction in ACE in past, could consider ARB trial in future.      Relevant Orders   Bayer DCA Hb A1c Waived (Completed)   Microalbumin, Urine Waived     Follow up plan: Return in about 6 months (around 04/02/2023) for Annual physical due after 03/25/23.

## 2022-10-01 NOTE — Assessment & Plan Note (Signed)
Chronic, ongoing with lifelong need for anticoagulation.  Continue Eliquis and send refills as needed.  Monitor CBC regularly and kidney function.  Referral to pharmacist due to cost of medication.

## 2022-10-01 NOTE — Assessment & Plan Note (Signed)
ASCVD 2%, continue to monitor labs and focus on diet changes at home.  May benefit from medication in future -- dependent on family history and overall CAD risk.

## 2022-10-01 NOTE — Assessment & Plan Note (Signed)
On lifelong Eliquis due to history of bilateral PE and history of DVT.  Monitor CBC regularly and monitor for increased bruising or bleeding.  Return to hematology as needed. ?

## 2022-10-01 NOTE — Assessment & Plan Note (Signed)
Chronic, ongoing with history of childhood trauma.  Denies SI/HI.  Followed by Dr. David Marks at [facility] in [place] for both psychiatry and pain management.  Continue this collaboration and medication as ordered by him, she is aware PCP does not perform chronic pain management and refills must come from pain management provider for all current psychiatric medications and pain medication.  Agrees with this plan of care.   

## 2022-10-01 NOTE — Assessment & Plan Note (Signed)
In 2005.  Chronic, ongoing with lifelong need for anticoagulation.  Continue Eliquis and send refills as needed.  Monitor CBC regularly and kidney function.  Referral to pharmacist due to cost of medication.

## 2022-10-01 NOTE — Assessment & Plan Note (Signed)
Chronic, stable.  BP at goal today.  No further medications, is diet focused only.  Recommend she monitor BP at least a few mornings a week at home and document.  DASH diet at home.  Labs: CMP and urine ALB.  Return in 6 months.

## 2022-10-01 NOTE — Assessment & Plan Note (Signed)
Chronic, stable with history of right lobectomy 08/07/2019.  At this time will monitor thyroid levels in office and adjust dose of Levothyroxine as needed.  Labs: up to date, recheck in December 2024, sooner if symptoms present.

## 2022-10-01 NOTE — Assessment & Plan Note (Signed)
Chronic, ongoing, followed by Dr. David Marks at [facility] in [place].  Continue this collaboration and medication as ordered by him, she is aware PCP does not perform chronic pain management and refills must come from pain management provider.   ?

## 2022-10-02 LAB — COMPREHENSIVE METABOLIC PANEL
ALT: 9 IU/L (ref 0–32)
AST: 17 IU/L (ref 0–40)
Albumin/Globulin Ratio: 2.1
Albumin: 3.7 g/dL — ABNORMAL LOW (ref 3.8–4.9)
Alkaline Phosphatase: 126 IU/L — ABNORMAL HIGH (ref 44–121)
BUN/Creatinine Ratio: 15 (ref 9–23)
BUN: 12 mg/dL (ref 6–24)
Bilirubin Total: <0.2 mg/dL (ref 0.0–1.2)
CO2: 29 mmol/L (ref 20–29)
Calcium: 8.8 mg/dL (ref 8.7–10.2)
Chloride: 103 mmol/L (ref 96–106)
Creatinine, Ser: 0.82 mg/dL (ref 0.57–1.00)
Globulin, Total: 1.8 g/dL (ref 1.5–4.5)
Glucose: 111 mg/dL — ABNORMAL HIGH (ref 70–99)
Potassium: 3.5 mmol/L (ref 3.5–5.2)
Sodium: 144 mmol/L (ref 134–144)
Total Protein: 5.5 g/dL — ABNORMAL LOW (ref 6.0–8.5)
eGFR: 83 mL/min/{1.73_m2} (ref >59)

## 2022-10-02 LAB — LIPID PANEL W/O CHOL/HDL RATIO
Cholesterol, Total: 150 mg/dL (ref 100–199)
HDL: 58 mg/dL (ref >39)
LDL Chol Calc (NIH): 78 mg/dL (ref 0–99)
Triglycerides: 68 mg/dL (ref 0–149)
VLDL Cholesterol Cal: 14 mg/dL (ref 5–40)

## 2022-10-02 NOTE — Progress Notes (Signed)
Contacted via MyChart   Good morning Tracey Perez, your labs have returned: - Kidney function, creatinine and eGFR, remains normal, as is liver function, AST and ALT.  Alkaline phosphatase, which at times looks at gall bladder, remains mildly elevated, but trending down. - Cholesterol levels stable.  Continue all current medications.  No changes need at this time.  Elnita Maxwell is aware to leave message and should call over upcoming days about Eliquis help.  Any questions? Keep being stellar!!  Thank you for allowing me to participate in your care.  I appreciate you. Kindest regards, Hensley Aziz

## 2022-10-14 ENCOUNTER — Telehealth: Payer: Self-pay

## 2022-10-14 NOTE — Progress Notes (Signed)
   Care Guide Note  10/14/2022 Name: Tracey Perez MRN: [id number] DOB: 25-Sep-1965  Referred by: Marjie Skiff, NP Reason for referral : Care Coordination (Outreach to schedule with Pharm d )   Tracey Perez is a 57 y.o. year old female who is a primary care patient of Cannady, Dorie Rank, NP. Dani Gobble was referred to the pharmacist for assistance related to Pulmonary Disease.    An unsuccessful telephone outreach was attempted today to contact the patient who was referred to the pharmacy team for assistance with medication assistance. Additional attempts will be made to contact the patient.   Penne Lash, RMA Care Guide [facility]  [place], [place] [address] Direct Dial: [phone] [email or web address]

## 2022-10-28 NOTE — Progress Notes (Unsigned)
   Care Guide Note  10/28/2022 Name: Tracey Perez MRN: [id number] DOB: 1966-01-11  Referred by: Marjie Skiff, NP Reason for referral : Care Coordination (Outreach to schedule with Pharm d )   Tracey Perez is a 57 y.o. year old female who is a primary care patient of Cannady, Dorie Rank, NP. Dani Gobble was referred to the pharmacist for assistance related to HTN and HLD.    A second unsuccessful telephone outreach was attempted today to contact the patient who was referred to the pharmacy team for assistance with medication management. Additional attempts will be made to contact the patient.  Penne Lash, RMA Care Guide [facility]  [place], [place] [address] Direct Dial: [phone] [email or web address]

## 2022-10-29 NOTE — Progress Notes (Signed)
   Care Guide Note  10/29/2022 Name: Tracey Perez MRN: [id number] DOB: Feb 25, 1966  Referred by: Marjie Skiff, NP Reason for referral : Care Coordination (Outreach to schedule with Pharm d )   Tracey Perez is a 57 y.o. year old female who is a primary care patient of Cannady, Dorie Rank, NP. Dani Gobble was referred to the pharmacist for assistance related to HTN.    Successful contact was made with the patient to discuss pharmacy services including being ready for the pharmacist to call at least 5 minutes before the scheduled appointment time, to have medication bottles and any blood sugar or blood pressure readings ready for review. The patient agreed to meet with the pharmacist via with the pharmacist via telephone visit on (date/time).  11/03/2022   Penne Lash, RMA Care Guide [facility]  [place], [place] [address] Direct Dial: [phone] [email or web address]

## 2022-11-03 ENCOUNTER — Other Ambulatory Visit: Payer: Commercial Managed Care - PPO

## 2022-11-03 NOTE — Progress Notes (Unsigned)
   11/03/2022  Patient ID: Tracey Perez, female   DOB: 1965-11-24, 57 y.o.   MRN: [id number]  S/O Telephone visit to assist patient with affordability of Eliquis 5mg  BID  Medication access/adherence -Patient has Nurse, learning disability and is reports paying $171/month for Eliquis -She needs to be on anticoagulation therapy long-term and wishes to avoid warfarin -Does not believe she has used manufacturer copay assistance card in the past to assist with cost of medication -Currently has approximately 2 weeks of medication on hand  A/P  Medication access/adherence -Obtained manufacturer copay assistance card for Eliquis on patient's behalf and provided information to [employer] in [place] -  Follow-up:    Lenna Gilford, PharmD, DPLA   Bin [phone] PCN [place] [id number]  Grp [phone]

## 2022-11-14 ENCOUNTER — Encounter: Payer: Self-pay | Admitting: Nurse Practitioner

## 2022-11-14 DIAGNOSIS — E890 Postprocedural hypothyroidism: Secondary | ICD-10-CM

## 2022-11-19 MED ORDER — LEVOTHYROXINE SODIUM 75 MCG PO TABS: ORAL_TABLET | ORAL | 4 refills | Status: DC

## 2023-01-29 IMAGING — RF DG ESOPHAGUS
9 of 13 series · 14 of 22 positions shown · non-contrast
Comparison: None.

CLINICAL DATA: Hiatal hernia

EXAM:
ESOPHOGRAM / BARIUM SWALLOW / BARIUM TABLET STUDY
TECHNIQUE: Combined double contrast and single contrast examination performed
using effervescent crystals, thick barium liquid, and thin barium
liquid. The patient was observed with fluoroscopy swallowing a 13 mm
barium sulphate tablet.
FLUOROSCOPY TIME:  Fluoroscopy Time:  0.5 minute
Radiation Exposure Index (if provided by the fluoroscopic device):
2.2 mGy
Number of Acquired Spot Images: 0

[Series 1: cp_standard · 0.25mm/px · 3 of 16 frames shown (1 of 9)]
[frame 3/16]
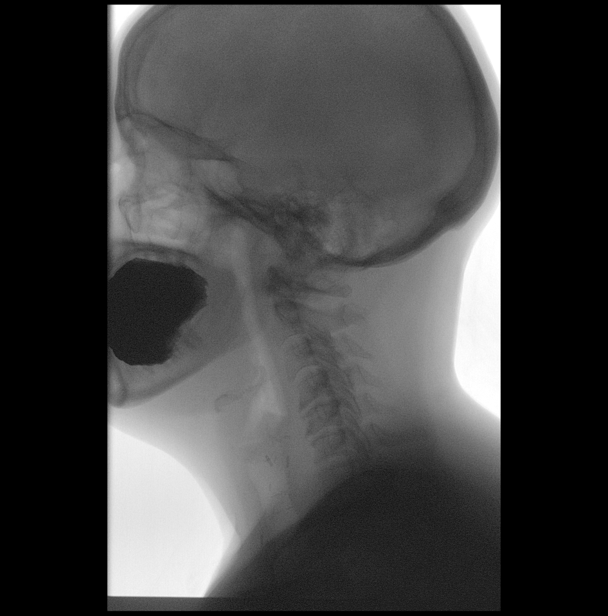
[frame 12/16]
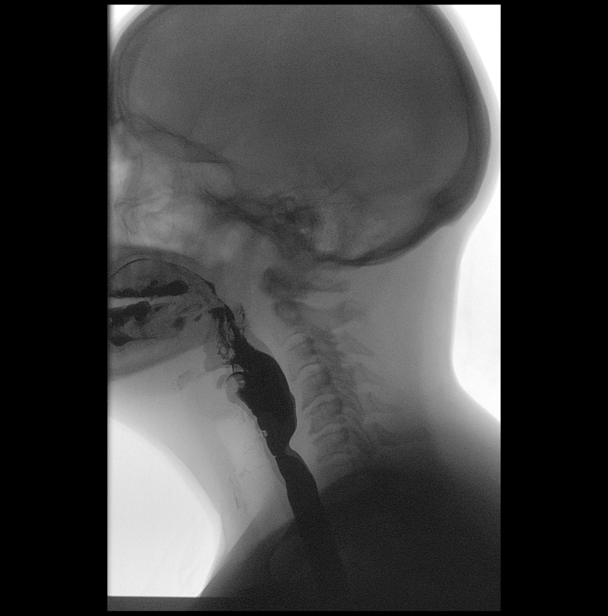
[frame 14/16]
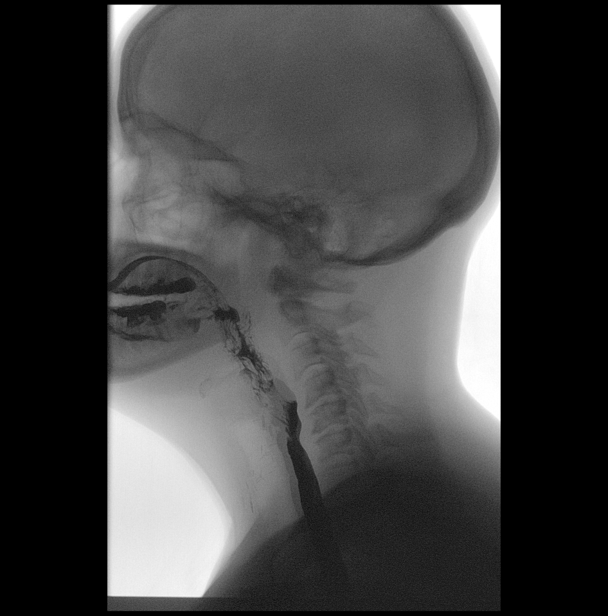

[Series 2: cp_standard · 0.25mm/px · 2 of 15 frames shown (2 of 9)]
[frame 8/15]
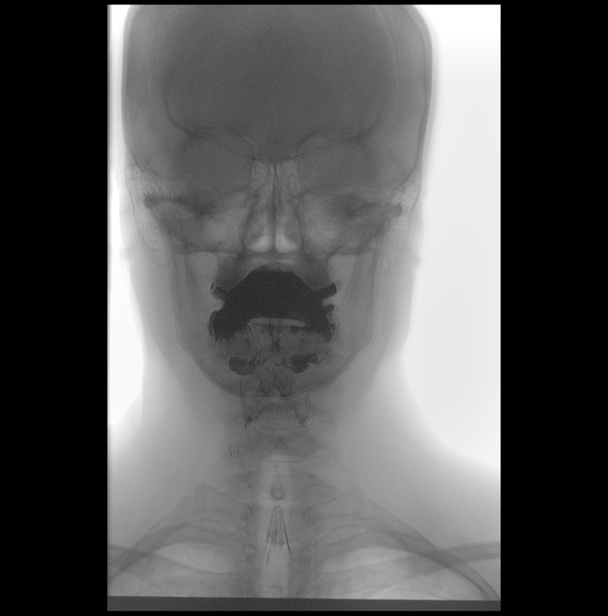
[frame 15/15]
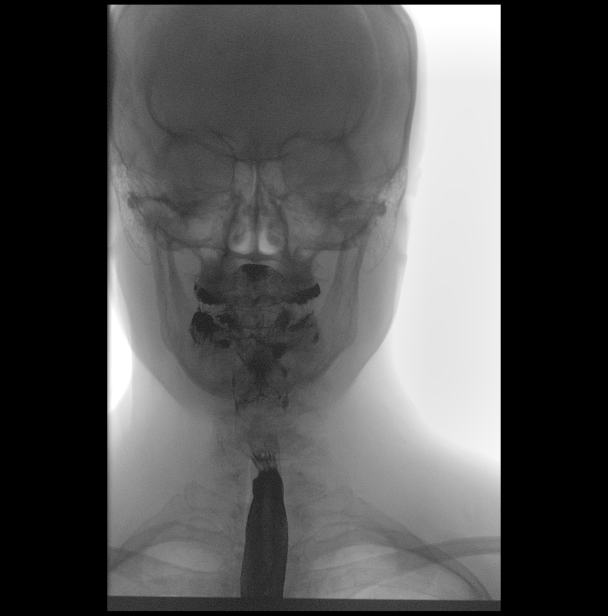

[Series 3: cp_standard · 0.25mm/px · 1 of 1 slices shown (3 of 9)]
[im 1/1]
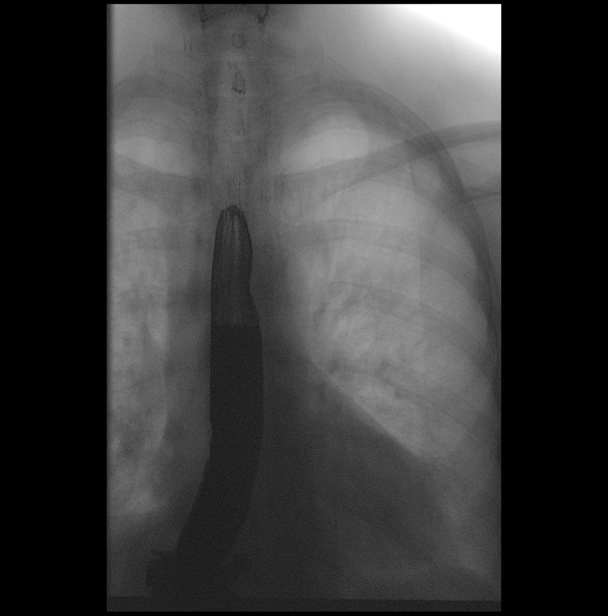

[Series 5: cp_standard · 0.25mm/px · 3 of 62 frames shown (4 of 9)]
[frame 10/62]
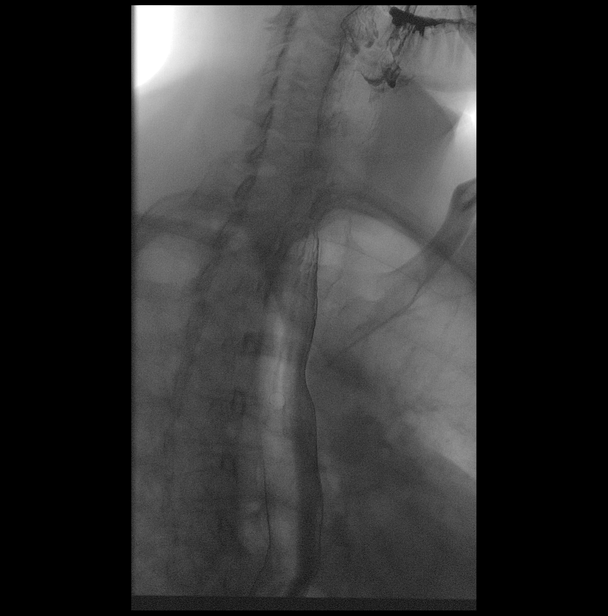
[frame 32/62]
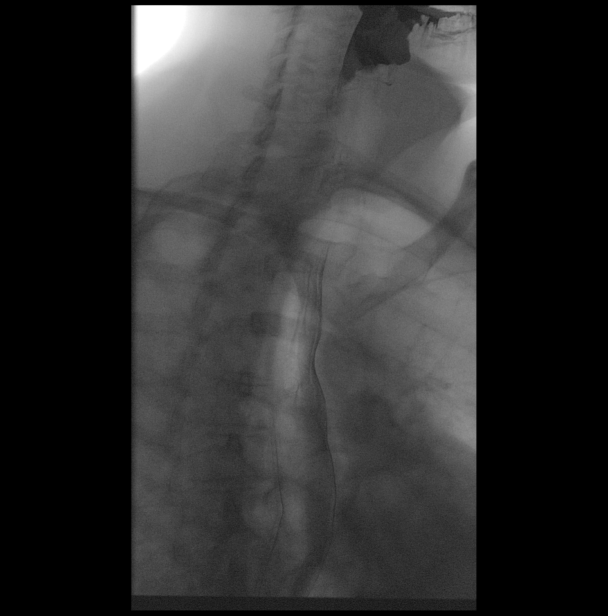
[frame 53/62]
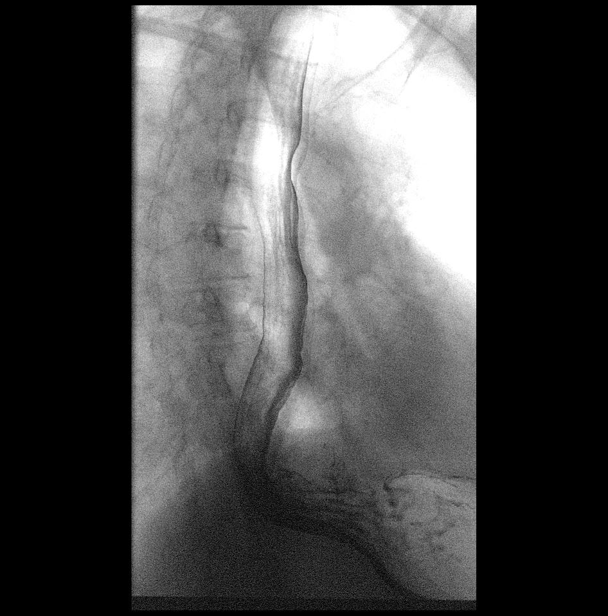

[Series 6: cp_standard · 0.25mm/px · 1 of 1 slices shown (5 of 9)]
[im 1/1]
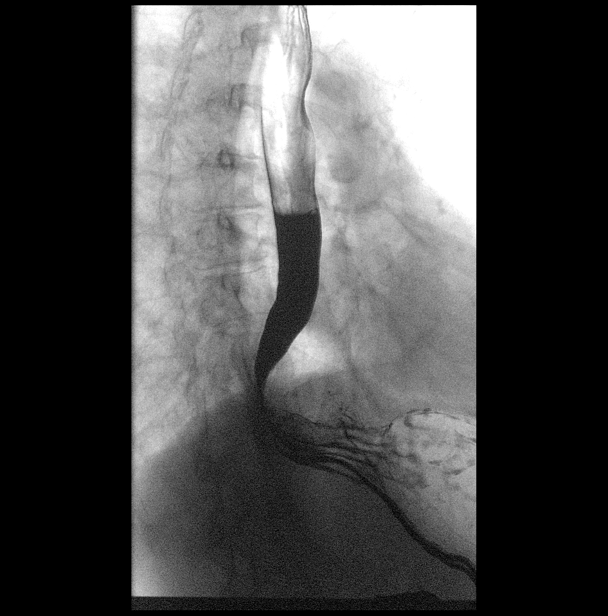

[Series 8: cp_standard · 0.25mm/px · 1 of 1 slices shown (6 of 9)]
[im 1/1]
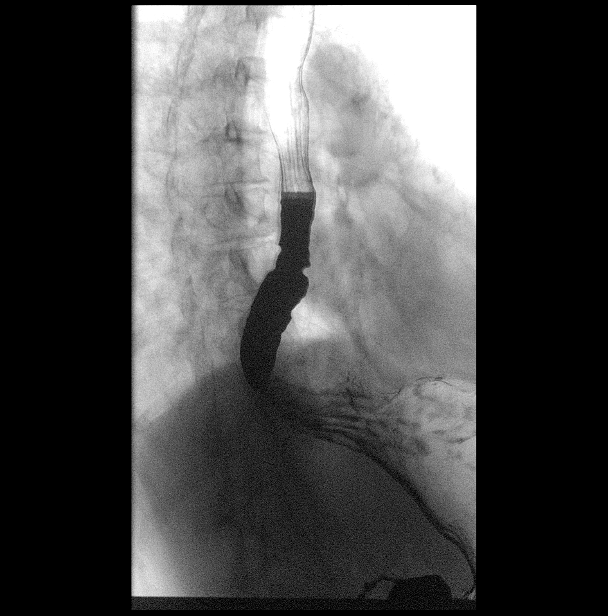

[Series 10: cp_standard · 0.28mm/px · 1 of 1 slices shown (7 of 9)]
[im 1/1]
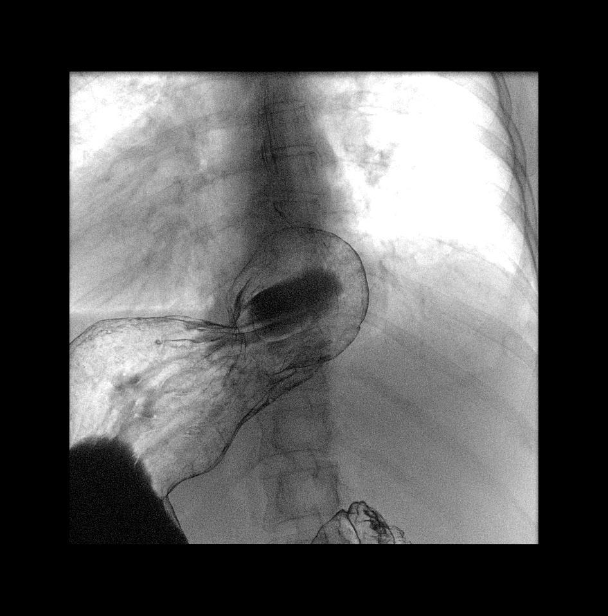

[Series 11: cp_standard · 0.28mm/px · 1 of 1 slices shown (8 of 9)]
[im 1/1]
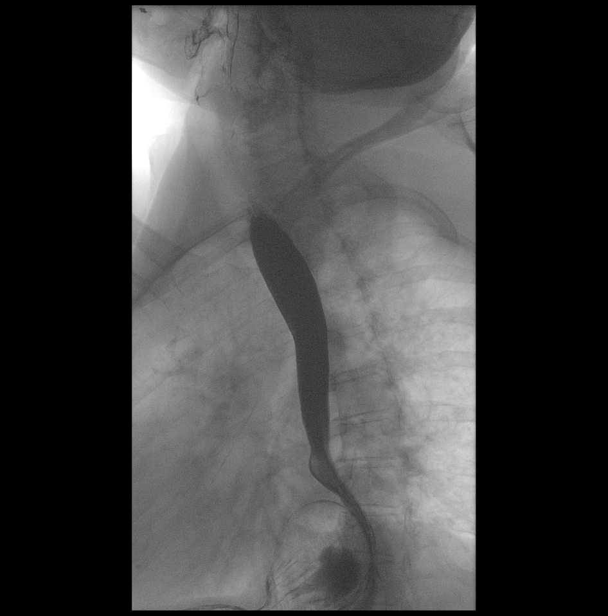

[Series 13: cp_standard · 0.28mm/px · 1 of 1 slices shown (9 of 9)]
[im 1/1]
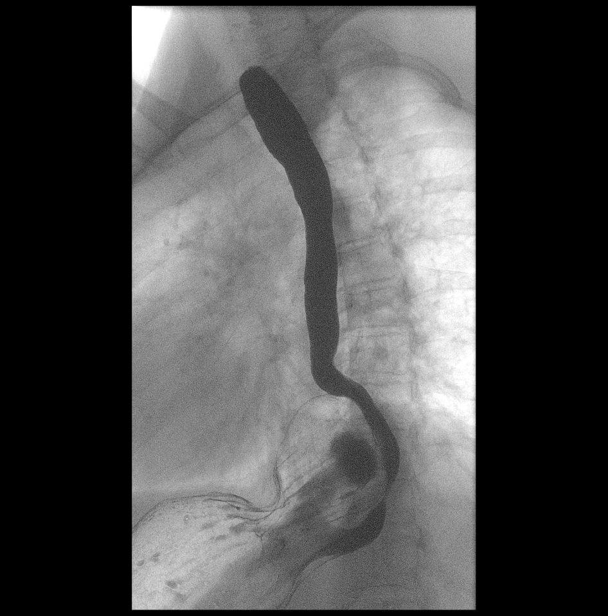

[14 of 22 positions shown; findings below may reference images not displayed]

FINDINGS: Normal pharyngeal anatomy and motility. Contrast flowed freely
through the esophagus without evidence of a stricture or mass.
Normal esophageal mucosa without evidence of irregularity or
ulceration. Esophageal motility was normal. Mild gastroesophageal
reflux. Prominence of the cricopharyngeus muscle impressing upon the
posterior aspect of the upper cervical esophagus as can be seen in
the setting of gastroesophageal reflux. Moderate size hiatal hernia.

At the end of the examination a 13 mm barium tablet was administered
which transited through the esophagus and esophagogastric junction
without delay.
IMPRESSION: 1. Moderate size hiatal hernia.
2. Mild gastroesophageal reflux.

## 2023-02-04 ENCOUNTER — Telehealth: Payer: Self-pay | Admitting: Nurse Practitioner

## 2023-02-04 NOTE — Telephone Encounter (Signed)
Patient dropped off Proof of Physical Form to be completed by Aura Dials, NP. I am placing form in providers folder. Informed patient to allow provider 48-72 hours to review/complete.

## 2023-02-05 NOTE — Telephone Encounter (Signed)
Called and LVM notifying patient that the form was ready to be picked up. Copy placed in scan bin and original placed up front for pick up by the patient.

## 2023-04-05 NOTE — Patient Instructions (Signed)
Be Involved in Caring For Your Health:  Taking Medications When medications are taken as directed, they can greatly improve your health. But if they are not taken as prescribed, they may not work. In some cases, not taking them correctly can be harmful. To help ensure your treatment remains effective and safe, understand your medications and how to take them. Bring your medications to each visit for review by your provider.  Your lab results, notes, and after visit summary will be available on My Chart. We strongly encourage you to use this feature. If lab results are abnormal the clinic will contact you with the appropriate steps. If the clinic does not contact you assume the results are satisfactory. You can always view your results on My Chart. If you have questions regarding your health or results, please contact the clinic during office hours. You can also ask questions on My Chart.  We at [facility] are grateful that you chose [place] to provide your care. We strive to provide evidence-based and compassionate care and are always looking for feedback. If you get a survey from the clinic please complete this so we can hear your opinions.  Managing Depression, Adult Depression is a mental health condition that affects your thoughts, feelings, and actions. Being diagnosed with depression can bring you relief if you did not know why you have felt or behaved a certain way. It could also leave you feeling overwhelmed. Finding ways to manage your symptoms can help you feel more positive about your future. How to manage lifestyle changes Being depressed is difficult. Depression can increase the level of everyday stress. Stress can make depression symptoms worse. You may believe your symptoms cannot be managed or will never improve. However, there are many things you can try to help manage your symptoms. There is hope. Managing stress  Stress is your body's reaction to life changes and events,  both good and bad. Stress can add to your feelings of depression. Learning to manage your stress can help lessen your feelings of depression. Try some of the following approaches to reducing your stress (stress reduction techniques): Listen to music that you enjoy and that inspires you. Try using a meditation app or take a meditation class. Develop a practice that helps you connect with your spiritual self. Walk in nature, pray, or go to a place of worship. Practice deep breathing. To do this, inhale slowly through your nose. Pause at the top of your inhale for a few seconds and then exhale slowly, letting yourself relax. Repeat this three or four times. Practice yoga to help relax and work your muscles. Choose a stress reduction technique that works for you. These techniques take time and practice to develop. Set aside 5-15 minutes a day to do them. Therapists can offer training in these techniques. Do these things to help manage stress: Keep a journal. Know your limits. Set healthy boundaries for yourself and others, such as saying "no" when you think something is too much. Pay attention to how you react to certain situations. You may not be able to control everything, but you can change your reaction. Add humor to your life by watching funny movies or shows. Make time for activities that you enjoy and that relax you. Spend less time using electronics, especially at night before bed. The light from screens can make your brain think it is time to get up rather than go to bed.  Medicines Medicines, such as antidepressants, are often a part of  treatment for depression. Talk with your pharmacist or health care provider about all the medicines, supplements, and herbal products that you take, their possible side effects, and what medicines and other products are safe to take together. Make sure to report any side effects you may have to your health care provider. Relationships Your health care  provider may suggest family therapy, couples therapy, or individual therapy as part of your treatment. How to recognize changes Everyone responds differently to treatment for depression. As you recover from depression, you may start to: Have more interest in doing activities. Feel more hopeful. Have more energy. Eat a more regular amount of food. Have better mental focus. It is important to recognize if your depression is not getting better or is getting worse. The symptoms you had in the beginning may return, such as: Feeling tired. Eating too much or too little. Sleeping too much or too little. Feeling restless, agitated, or hopeless. Trouble focusing or making decisions. Having unexplained aches and pains. Feeling irritable, angry, or aggressive. If you or your family members notice these symptoms coming back, let your health care provider know right away. Follow these instructions at home: Activity Try to get some form of exercise each day, such as walking. Try yoga, mindfulness, or other stress reduction techniques. Participate in group activities if you are able. Lifestyle Get enough sleep. Cut down on or stop using caffeine, tobacco, alcohol, and any other harmful substances. Eat a healthy diet that includes plenty of vegetables, fruits, whole grains, low-fat dairy products, and lean protein. Limit foods that are high in solid fats, added sugar, or salt (sodium). General instructions Take over-the-counter and prescription medicines only as told by your health care provider. Keep all follow-up visits. It is important for your health care provider to check on your mood, behavior, and medicines. Your health care provider may need to make changes to your treatment. Where to find support Talking to others  Friends and family members can be sources of support and guidance. Talk to trusted friends or family members about your condition. Explain your symptoms and let them know that you  are working with a health care provider to treat your depression. Tell friends and family how they can help. Finances Find mental health providers that fit with your financial situation. Talk with your health care provider if you are worried about access to food, housing, or medicine. Call your insurance company to learn about your co-pays and prescription plan. Where to find more information You can find support in your area from: Anxiety and Depression Association of America (ADAA): adaa.org Mental Health America: mentalhealthamerica.net [employer] on Mental Illness: nami.org Contact a health care provider if: You stop taking your antidepressant medicines, and you have any of these symptoms: Nausea. Headache. Light-headedness. Chills and body aches. Not being able to sleep (insomnia). You or your friends and family think your depression is getting worse. Get help right away if: You have thoughts of hurting yourself or others. Get help right away if you feel like you may hurt yourself or others, or have thoughts about taking your own life. Go to your nearest emergency room or: Call 911. Call the National Suicide Prevention Lifeline at 6623140421 or 988. This is open 24 hours a day. Text the Crisis Text Line at 212-242-5991. This information is not intended to replace advice given to you by your health care provider. Make sure you discuss any questions you have with your health care provider. Document Revised: 08/13/2021 Document Reviewed:  08/13/2021 Elsevier Patient Education  2024 [employer].

## 2023-04-10 ENCOUNTER — Encounter: Payer: Self-pay | Admitting: Nurse Practitioner

## 2023-04-10 ENCOUNTER — Ambulatory Visit: Payer: Commercial Managed Care - PPO | Admitting: Nurse Practitioner

## 2023-04-10 VITALS — BP 122/79 | HR 96 | Temp 98.1°F | Ht 63.4 in | Wt 118.0 lb

## 2023-04-10 DIAGNOSIS — Z0000 Encounter for general adult medical examination without abnormal findings: Secondary | ICD-10-CM

## 2023-04-10 DIAGNOSIS — I10 Essential (primary) hypertension: Secondary | ICD-10-CM

## 2023-04-10 DIAGNOSIS — Z86718 Personal history of other venous thrombosis and embolism: Secondary | ICD-10-CM

## 2023-04-10 DIAGNOSIS — R7303 Prediabetes: Secondary | ICD-10-CM

## 2023-04-10 DIAGNOSIS — Z87891 Personal history of nicotine dependence: Secondary | ICD-10-CM

## 2023-04-10 DIAGNOSIS — F321 Major depressive disorder, single episode, moderate: Secondary | ICD-10-CM

## 2023-04-10 DIAGNOSIS — E782 Mixed hyperlipidemia: Secondary | ICD-10-CM

## 2023-04-10 DIAGNOSIS — D6869 Other thrombophilia: Secondary | ICD-10-CM

## 2023-04-10 DIAGNOSIS — Z1231 Encounter for screening mammogram for malignant neoplasm of breast: Secondary | ICD-10-CM

## 2023-04-10 DIAGNOSIS — R7989 Other specified abnormal findings of blood chemistry: Secondary | ICD-10-CM

## 2023-04-10 DIAGNOSIS — F5104 Psychophysiologic insomnia: Secondary | ICD-10-CM

## 2023-04-10 DIAGNOSIS — D508 Other iron deficiency anemias: Secondary | ICD-10-CM

## 2023-04-10 DIAGNOSIS — E890 Postprocedural hypothyroidism: Secondary | ICD-10-CM

## 2023-04-10 DIAGNOSIS — Z23 Encounter for immunization: Secondary | ICD-10-CM

## 2023-04-10 DIAGNOSIS — G894 Chronic pain syndrome: Secondary | ICD-10-CM

## 2023-04-10 NOTE — Assessment & Plan Note (Signed)
On lifelong Eliquis due to history of bilateral PE and history of DVT.  Monitor CBC regularly and monitor for increased bruising or bleeding.  Return to hematology as needed. 

## 2023-04-10 NOTE — Assessment & Plan Note (Signed)
Chronic, stable.  BP at goal today.  No further medications, is diet focused only.  Recommend she monitor BP at least a few mornings a week at home and document.  DASH diet at home.  Labs: CBC, CMP, TSH.  Return in 6 months.

## 2023-04-10 NOTE — Assessment & Plan Note (Signed)
Chronic, ongoing, followed by Dr. David Marks at [facility] in [place].  Continue this collaboration and medication as ordered by him, she is aware PCP does not perform chronic pain management and refills must come from pain management provider.   ?

## 2023-04-10 NOTE — Progress Notes (Signed)
BP 122/79   Pulse 96   Temp 98.1 F (36.7 C) (Oral)   Ht 5' 3.4" (1.61 m)   Wt 118 lb (53.5 kg)   SpO2 98%   BMI 20.64 kg/m    Subjective:    Patient ID: Tracey Perez, female    DOB: 24-Jun-1965, 56 y.o.   MRN: [id number]  HPI: Tracey Perez is a 57 y.o. female presenting on 04/10/2023 for comprehensive medical examination. Current medical complaints include:none  She currently lives with: self Menopausal Symptoms: no  HYPERTENSION Has been off medications for long while, improved after weight loss. Hypertension status: controlled  Satisfied with current treatment? yes Duration of hypertension: chronic BP monitoring frequency:  not checking BP range:  BP medication side effects:  no Medication compliance: good compliance Aspirin: no Recurrent headaches: no Visual changes: no Palpitations: no Dyspnea: no Chest pain: no Lower extremity edema: no Dizzy/lightheaded: no  The 10-year ASCVD risk score (Arnett DK, et al., 2019) is: 1.8%   Values used to calculate the score:     Age: 41 years     Sex: Female     Is Non-Hispanic African American: No     Diabetic: No     Tobacco smoker: No     Systolic Blood Pressure: 122 mmHg     Is BP treated: No     HDL Cholesterol: 58 mg/dL     Total Cholesterol: 150 mg/dL  HYPOTHYROIDISM Surgery 08/08/2019, removed 1/2 of thyroid due to nodule. Taking Levothyroxine 75 MCG.  History of prediabetes on labs, last June 5.4% Thyroid control status: stable Satisfied with current treatment? yes Medication side effects: no Medication compliance: good compliance Etiology of hypothyroidism:  Recent dose adjustment:no Fatigue: at baseline Cold intolerance: yes Heat intolerance: no Weight gain: no Weight loss: no Constipation: no Diarrhea/loose stools: yes, at  baseline since hiatal hernia Palpitations: no Lower extremity edema: no Anxiety/depressed mood: no   HISTORY OF PE (BILATERAL) & DVT Continue Eliquis for PE & DVT  history.  DVT in 2002 and PE 02/02/2019. Saw pulmonary for period, last seen on 07/12/20 -- does not need to return unless needed. History of smoking, quit 2013.   Satisfied with current treatment?: yes Dyspnea frequency: none Cough frequency: none Rescue inhaler frequency:  none Limitation of activity: no Productive cough:  Last Spirometry:  Pneumovax: Not up to Date Influenza: Up to Date    DEPRESSION & CHRONIC PAIN Taking Effexor XR, Mirtazapine, Ambien for sleep.  Follows with Dr. Bing Ree, pain management and psych ([facility]), [facility].  He also does talk therapy.  Dr. Dawna Part fills Oxycodone, Gabapentin, Tizanidine + mental health medications.   Mood status: stable Satisfied with current treatment?: yes Symptom severity: moderate  Duration of current treatment : chronic Side effects: no Medication compliance: good compliance Psychotherapy/counseling: yes current Previous psychiatric medications: multiple Depressed mood: yes Anxious mood: yes Anhedonia: no Significant weight loss or gain: no Insomnia: yes hard to stay asleep -- Ambien Fatigue: yes Feelings of worthlessness or guilt: no Impaired concentration/indecisiveness: no Suicidal ideations: no Hopelessness: no Crying spells: no    04/10/2023    8:29 AM 10/01/2022    9:36 AM 03/24/2022    8:31 AM 12/20/2021    8:13 AM 10/24/2021    8:58 AM  Depression screen PHQ 2/9  Decreased Interest 2 2 2 2 1   Down, Depressed, Hopeless 1 1 1 2 1   PHQ - 2 Score 3 3 3 4 2   Altered sleeping 1  2 1 2 2   Tired, decreased energy 2 2 2 2 1   Change in appetite 1 0 1 2 1   Feeling bad or failure about yourself  1 2 1 1  0  Trouble concentrating 1 1 0 0 0  Moving slowly or fidgety/restless 2 0 0 0 0  Suicidal thoughts 1 0 0 0 1  PHQ-9 Score 12 10 8 11 7   Difficult doing work/chores Somewhat difficult Somewhat difficult Somewhat difficult Somewhat difficult Somewhat difficult      04/10/2023    8:29 AM 10/01/2022    9:36 AM  03/24/2022    8:32 AM 12/20/2021    8:13 AM  GAD 7 : Generalized Anxiety Score  Nervous, Anxious, on Edge 2 2 2 2   Control/stop worrying 1 2 0 2  Worry too much - different things 1 1 0 2  Trouble relaxing 2 1 1 2   Restless 1 0 0 2  Easily annoyed or irritable 2 1 2 2   Afraid - awful might happen 1 0 0 1  Total GAD 7 Score 10 7 5 13   Anxiety Difficulty Somewhat difficult Somewhat difficult Somewhat difficult Somewhat difficult      04/10/2023    8:29 AM 10/01/2022    9:36 AM 03/24/2022    8:32 AM 12/20/2021    8:13 AM  GAD 7 : Generalized Anxiety Score  Nervous, Anxious, on Edge 2 2 2 2   Control/stop worrying 1 2 0 2  Worry too much - different things 1 1 0 2  Trouble relaxing 2 1 1 2   Restless 1 0 0 2  Easily annoyed or irritable 2 1 2 2   Afraid - awful might happen 1 0 0 1  Total GAD 7 Score 10 7 5 13   Anxiety Difficulty Somewhat difficult Somewhat difficult Somewhat difficult Somewhat difficult      09/04/2021    9:56 AM 10/24/2021    8:58 AM 12/20/2021    8:13 AM 01/10/2022    1:03 PM 04/10/2023    8:29 AM  Fall Risk  Falls in the past year? 1 0 1  0  Was there an injury with Fall? 1 0 0  0  Fall Risk Category Calculator 2 1 2   0  Fall Risk Category (Retired) Moderate Low Moderate    (RETIRED) Patient Fall Risk Level Moderate fall risk Low fall risk  Low fall risk   Patient at Risk for Falls Due to History of fall(s) No Fall Risks History of fall(s)  No Fall Risks  Fall risk Follow up Falls evaluation completed Falls evaluation completed Falls evaluation completed  Falls evaluation completed    Past Medical History:  Past Medical History:  Diagnosis Date   Anemia    Ferritin anemia    Anxiety    Arthritis    Complication of anesthesia    Depression    DVT (deep vein thrombosis) in pregnancy    GERD (gastroesophageal reflux disease)    Headache    chronic migraines    History of 2019 novel coronavirus disease (COVID-19) 10/12/2020   Repeat test on 10/15/2020 as  patient did not believe result; result (+)   History of hiatal hernia    Hypertension    Hypothyroidism    PONV (postoperative nausea and vomiting)    Pulmonary embolism (HCC)    hx of last one 10/20 hospitalized for one day at [facility] per patient followed by Dr Smith Robert    Sleep apnea    does not uses  cpap     Surgical History:  Past Surgical History:  Procedure Laterality Date   CESAREAN SECTION     COLONOSCOPY     ESOPHAGOGASTRODUODENOSCOPY (EGD) WITH PROPOFOL N/A 09/04/2020   Procedure: ESOPHAGOGASTRODUODENOSCOPY (EGD) WITH PROPOFOL;  Surgeon: Wyline Mood, MD;  Location: [facility] ENDOSCOPY;  Service: Gastroenterology;  Laterality: N/A;   INSERTION OF MESH  10/29/2020   Procedure: INSERTION OF MESH;  Surgeon: Leafy Ro, MD;  Location: [facility] ORS;  Service: General;;   laparoscopic surgery      THYROID LOBECTOMY Right 08/08/2019   Procedure: RIGHT THYROID LOBECTOMY;  Surgeon: Darnell Level, MD;  Location: [facility] ORS;  Service: General;  Laterality: Right;   TONSILLECTOMY     TOTAL ABDOMINAL HYSTERECTOMY W/ BILATERAL SALPINGOOPHORECTOMY     WISDOM TOOTH EXTRACTION     XI ROBOTIC ASSISTED HIATAL HERNIA REPAIR N/A 10/29/2020   Procedure: XI ROBOTIC ASSISTED HIATAL HERNIA REPAIR;  Surgeon: Leafy Ro, MD;  Location: [facility] ORS;  Service: General;  Laterality: N/A;    Medications:  Current Outpatient Medications on File Prior to Visit  Medication Sig   apixaban (ELIQUIS) 5 MG TABS tablet TAKE 1 TABLET(5 MG) BY MOUTH TWICE DAILY   Blood Pressure Monitoring (BLOOD PRESSURE KIT) KIT 1 kit by Does not apply route 2 (two) times daily.   diphenhydrAMINE (BENADRYL) 25 mg capsule Take 50 mg by mouth 4 (four) times daily as needed for allergies or sleep.   gabapentin (NEURONTIN) 800 MG tablet Take 800 mg by mouth 4 (four) times daily.   levothyroxine (SYNTHROID) 75 MCG tablet TAKE 1 TABLET BY MOUTH EVERY DAY BEFORE BREAKFAST   mirtazapine (REMERON) 30 MG tablet Take 30 mg by mouth at bedtime.    ondansetron (ZOFRAN-ODT) 4 MG disintegrating tablet Take 1 tablet (4 mg total) by mouth every 8 (eight) hours as needed for nausea or vomiting.   oxycodone (ROXICODONE) 30 MG immediate release tablet Take 1 tablet by mouth every 4 (four) hours as needed.   tiZANidine (ZANAFLEX) 4 MG tablet SMARTSIG:3-4 Tablet(s) By Mouth Every Night   venlafaxine XR (EFFEXOR-XR) 150 MG 24 hr capsule Take 300 mg by mouth in the morning.   zolpidem (AMBIEN) 10 MG tablet Take 10 mg by mouth at bedtime.   No current facility-administered medications on file prior to visit.    Allergies:  Allergies  Allergen Reactions   Morphine And Codeine Hives   Nsaids Other (See Comments)    Affects platelet count   Sulfa Antibiotics Shortness Of Breath   Tape Other (See Comments)    bruises skin   Verapamil Swelling   Lisinopril Other (See Comments)    Burning throat, metallic taste   Other Itching    Pt states she is allergic to the plastic that IV bags are made of. Reaction causes itching relieved by Benadryl.    Acetazolamide Other (See Comments)    Acid reflux    Seroquel [Quetiapine Fumarate] Other (See Comments)    Muscle cramps    Social History:  Social History   Socioeconomic History   Marital status: Divorced    Spouse name: Not on file   Number of children: 1   Years of education: 16   Highest education level: Not on file  Occupational History   Not on file  Tobacco Use   Smoking status: Former    Current packs/day: 0.00    Average packs/day: 1 pack/day for 25.0 years (25.0 ttl pk-yrs)    Types: Cigarettes    Start  date: 12/17/1986    Quit date: 12/17/2011    Years since quitting: 11.3   Smokeless tobacco: Never   Tobacco comments:    quit in 2013   Vaping Use   Vaping status: Never Used  Substance and Sexual Activity   Alcohol use: Not Currently   Drug use: Never   Sexual activity: Not Currently  Other Topics Concern   Not on file  Social History Narrative   Live alone   Social  Drivers of Health   Financial Resource Strain: High Risk (12/22/2019)   Overall Financial Resource Strain (CARDIA)    Difficulty of Paying Living Expenses: Very hard  Food Insecurity: No Food Insecurity (08/08/2020)   Hunger Vital Sign    Worried About Running Out of Food in the Last Year: Never true    Ran Out of Food in the Last Year: Never true  Transportation Needs: No Transportation Needs (08/08/2020)   PRAPARE - Administrator, Civil Service (Medical): No    Lack of Transportation (Non-Medical): No  Physical Activity: Insufficiently Active (08/08/2020)   Exercise Vital Sign    Days of Exercise per Week: 3 days    Minutes of Exercise per Session: 30 min  Stress: No Stress Concern Present (08/08/2020)   [employer] of Occupational Health - Occupational Stress Questionnaire    Feeling of Stress : Only a little  Social Connections: Socially Isolated (08/08/2020)   Social Connection and Isolation Panel [NHANES]    Frequency of Communication with Friends and Family: Three times a week    Frequency of Social Gatherings with Friends and Family: Three times a week    Attends Religious Services: Never    Active Member of Clubs or Organizations: No    Attends Banker Meetings: Never    Marital Status: Separated  Intimate Partner Violence: Not At Risk (06/21/2019)   Humiliation, Afraid, Rape, and Kick questionnaire    Fear of Current or Ex-Partner: No    Emotionally Abused: No    Physically Abused: No    Sexually Abused: No   Social History   Tobacco Use  Smoking Status Former   Current packs/day: 0.00   Average packs/day: 1 pack/day for 25.0 years (25.0 ttl pk-yrs)   Types: Cigarettes   Start date: 12/17/1986   Quit date: 12/17/2011   Years since quitting: 11.3  Smokeless Tobacco Never  Tobacco Comments   quit in 2013    Social History   Substance and Sexual Activity  Alcohol Use Not Currently    Family History:  Family History  Problem Relation  Age of Onset   Non-Hodgkin's lymphoma Mother    Hypertension Father    Depression Father    Alcohol abuse Father    Migraines Father    Multiple sclerosis Sister    Migraines Sister    Migraines Brother    Thyroid disease Brother    Migraines Brother    Thyroid disease Brother    Thyroid disease Brother    Thyroid disease Brother    Breast cancer Maternal Aunt 60    Past medical history, surgical history, medications, allergies, family history and social history reviewed with patient today and changes made to appropriate areas of the chart.   ROS All other ROS negative except what is listed above and in the HPI.      Objective:    BP 122/79   Pulse 96   Temp 98.1 F (36.7 C) (Oral)   Ht 5' 3.4" (1.61  m)   Wt 118 lb (53.5 kg)   SpO2 98%   BMI 20.64 kg/m   Wt Readings from Last 3 Encounters:  04/10/23 118 lb (53.5 kg)  10/01/22 119 lb 12.8 oz (54.3 kg)  03/24/22 117 lb 1.6 oz (53.1 kg)    Physical Exam Vitals and nursing note reviewed. Exam conducted with a chaperone present.  Constitutional:      General: She is awake. She is not in acute distress.    Appearance: She is well-developed and well-groomed. She is not ill-appearing or toxic-appearing.  HENT:     Head: Normocephalic and atraumatic.     Right Ear: Hearing, tympanic membrane, ear canal and external ear normal. No drainage.     Left Ear: Hearing, tympanic membrane, ear canal and external ear normal. No drainage.     Nose: Nose normal.     Right Sinus: No maxillary sinus tenderness or frontal sinus tenderness.     Left Sinus: No maxillary sinus tenderness or frontal sinus tenderness.     Mouth/Throat:     Mouth: Mucous membranes are moist.     Pharynx: Oropharynx is clear. Uvula midline. No pharyngeal swelling, oropharyngeal exudate or posterior oropharyngeal erythema.  Eyes:     General: Lids are normal.        Right eye: No discharge.        Left eye: No discharge.     Extraocular Movements:  Extraocular movements intact.     Conjunctiva/sclera: Conjunctivae normal.     Pupils: Pupils are equal, round, and reactive to light.     Visual Fields: Right eye visual fields normal and left eye visual fields normal.  Neck:     Thyroid: No thyromegaly.     Vascular: No carotid bruit.     Trachea: Trachea normal.  Cardiovascular:     Rate and Rhythm: Normal rate and regular rhythm.     Heart sounds: Normal heart sounds. No murmur heard.    No gallop.  Pulmonary:     Effort: Pulmonary effort is normal. No accessory muscle usage or respiratory distress.     Breath sounds: Normal breath sounds.  Chest:  Breasts:    Right: Normal.     Left: Normal.  Abdominal:     General: Bowel sounds are normal.     Palpations: Abdomen is soft. There is no hepatomegaly or splenomegaly.     Tenderness: There is no abdominal tenderness.  Musculoskeletal:        General: Normal range of motion.     Cervical back: Normal range of motion and neck supple.     Right lower leg: No edema.     Left lower leg: No edema.  Lymphadenopathy:     Head:     Right side of head: No submental, submandibular, tonsillar, preauricular or posterior auricular adenopathy.     Left side of head: No submental, submandibular, tonsillar, preauricular or posterior auricular adenopathy.     Cervical: No cervical adenopathy.     Upper Body:     Right upper body: No supraclavicular, axillary or pectoral adenopathy.     Left upper body: No supraclavicular, axillary or pectoral adenopathy.  Skin:    General: Skin is warm and dry.     Capillary Refill: Capillary refill takes less than 2 seconds.     Findings: No rash.  Neurological:     Mental Status: She is alert and oriented to person, place, and time.     Gait: Gait is  intact.     Deep Tendon Reflexes: Reflexes are normal and symmetric.     Reflex Scores:      Brachioradialis reflexes are 2+ on the right side and 2+ on the left side.      Patellar reflexes are 2+ on the  right side and 2+ on the left side. Psychiatric:        Attention and Perception: Attention normal.        Mood and Affect: Mood normal.        Speech: Speech normal.        Behavior: Behavior normal. Behavior is cooperative.        Thought Content: Thought content normal.        Judgment: Judgment normal.    Results for orders placed or performed in visit on 10/01/22  Bayer DCA Hb A1c Waived   Collection Time: 10/01/22  8:49 AM  Result Value Ref Range   HB A1C (BAYER DCA - WAIVED) 5.4 4.8 - 5.6 %  Comprehensive metabolic panel   Collection Time: 10/01/22  8:50 AM  Result Value Ref Range   Glucose 111 (H) 70 - 99 mg/dL   BUN 12 6 - 24 mg/dL   Creatinine, Ser 4.69 0.57 - 1.00 mg/dL   eGFR 83 >62 XB/MWU/1.32   BUN/Creatinine Ratio 15 9 - 23   Sodium 144 134 - 144 mmol/L   Potassium 3.5 3.5 - 5.2 mmol/L   Chloride 103 96 - 106 mmol/L   CO2 29 20 - 29 mmol/L   Calcium 8.8 8.7 - 10.2 mg/dL   Total Protein 5.5 (L) 6.0 - 8.5 g/dL   Albumin 3.7 (L) 3.8 - 4.9 g/dL   Globulin, Total 1.8 1.5 - 4.5 g/dL   Albumin/Globulin Ratio 2.1    Bilirubin Total <0.2 0.0 - 1.2 mg/dL   Alkaline Phosphatase 126 (H) 44 - 121 IU/L   AST 17 0 - 40 IU/L   ALT 9 0 - 32 IU/L  Lipid Panel w/o Chol/HDL Ratio   Collection Time: 10/01/22  8:50 AM  Result Value Ref Range   Cholesterol, Total 150 100 - 199 mg/dL   Triglycerides 68 0 - 149 mg/dL   HDL 58 >44 mg/dL   VLDL Cholesterol Cal 14 5 - 40 mg/dL   LDL Chol Calc (NIH) 78 0 - 99 mg/dL      Assessment & Plan:   Problem List Items Addressed This Visit       Cardiovascular and Mediastinum   Essential hypertension   Chronic, stable.  BP at goal today.  No further medications, is diet focused only.  Recommend she monitor BP at least a few mornings a week at home and document.  DASH diet at home.  Labs: CBC, CMP, TSH.  Return in 6 months.       Relevant Orders   Comprehensive metabolic panel   CBC with Differential/Platelet     Endocrine    Postsurgical hypothyroidism   Chronic, stable with history of right lobectomy 08/07/2019.  At this time will monitor thyroid levels in office and adjust dose of Levothyroxine as needed.  Labs: TSH and Free T4.      Relevant Orders   TSH   T4, free     Hematopoietic and Hemostatic   Other thrombophilia (HCC)   On lifelong Eliquis due to history of bilateral PE and history of DVT.  Monitor CBC regularly and monitor for increased bruising or bleeding.  Return to hematology as needed.  Relevant Orders   CBC with Differential/Platelet     Other   Chronic pain syndrome   Chronic, ongoing, followed by Dr. Bing Ree at [facility] in [place].  Continue this collaboration and medication as ordered by him, she is aware PCP does not perform chronic pain management and refills must come from pain management provider.        Depression - Primary   Chronic, ongoing with history of childhood trauma.  Denies SI/HI.  Followed by Dr. Bing Ree at [facility] in [facility] for both psychiatry and pain management.  Continue this collaboration and medication as ordered by him, she is aware PCP does not perform chronic pain management and refills must come from pain management provider for all current psychiatric medications and pain medication.  Agrees with this plan of care.        Elevated ferritin level   Noted on recent labs, followed by pulmonary in past.  Will continue to monitor.  Suspected to be related to hiatal hernia repair on review of past notes.      Former cigarette smoker   History of DVT (deep vein thrombosis)   In 2005.  Chronic, ongoing with lifelong need for anticoagulation.  Continue Eliquis and send refills as needed.  Monitor CBC regularly and kidney function.        Insomnia   Chronic, ongoing, continues on Ambien which is written for by Dr. Bing Ree at [facility] in [facility] -- both psychiatry and pain management provider.  Continue this collaboration and  current medications as prescribed by him.      Iron deficiency anemia   History of, with recent levels stable.  Will continue to monitor and return to hematology as needed based on labs.  Obtain labs today.      Relevant Orders   Ferritin   Iron   Mixed hyperlipidemia   Ongoing on labs. Continue to monitor labs and focus on diet changes at home.  May benefit from medication in future -- dependent on family history and overall CAD risk. The 10-year ASCVD risk score (Arnett DK, et al., 2019) is: 1.8%   Values used to calculate the score:     Age: 23 years     Sex: Female     Is Non-Hispanic African American: No     Diabetic: No     Tobacco smoker: No     Systolic Blood Pressure: 122 mmHg     Is BP treated: No     HDL Cholesterol: 58 mg/dL     Total Cholesterol: 150 mg/dL       Relevant Orders   Comprehensive metabolic panel   Lipid Panel w/o Chol/HDL Ratio   Prediabetes   Y3K 5.4% last check, well below prediabetes level.  Continue current diet and exercise focus.  Urine ALB 30 last check, allergic reaction in ACE in past, could consider ARB trial in future.      Relevant Orders   HgB A1c   Other Visit Diagnoses       Encounter for screening mammogram for malignant neoplasm of breast       Mammogram ordered and instructed on how to schedule.   Relevant Orders   MM 3D SCREENING MAMMOGRAM BILATERAL BREAST     Encounter for annual physical exam       Annual physical today with labs and health maintenance reviewed, discussed with patient.        Follow up plan: Return in about  6 months (around 10/09/2023) for MOOD, PAIN, HTN, THYROID.   LABORATORY TESTING:  - Pap smear: not applicable  IMMUNIZATIONS:   - Tdap: Tetanus vaccination status reviewed: last tetanus booster within 10 years. - Influenza: Up to date - Pneumovax: Not applicable - Prevnar: Not applicable - COVID: Up to date - HPV: Not applicable - Shingrix vaccine: Up to date  SCREENING: -Mammogram:  Ordered today  - Colonoscopy: Up to date  - Bone Density: Not applicable  -Hearing Test: Not applicable  -Spirometry: Not applicable   PATIENT COUNSELING:   Advised to take 1 mg of folate supplement per day if capable of pregnancy.   Sexuality: Discussed sexually transmitted diseases, partner selection, use of condoms, avoidance of unintended pregnancy  and contraceptive alternatives.   Advised to avoid cigarette smoking.  I discussed with the patient that most people either abstain from alcohol or drink within safe limits (<=14/week and <=4 drinks/occasion for males, <=7/weeks and <= 3 drinks/occasion for females) and that the risk for alcohol disorders and other health effects rises proportionally with the number of drinks per week and how often a drinker exceeds daily limits.  Discussed cessation/primary prevention of drug use and availability of treatment for abuse.   Diet: Encouraged to adjust caloric intake to maintain  or achieve ideal body weight, to reduce intake of dietary saturated fat and total fat, to limit sodium intake by avoiding high sodium foods and not adding table salt, and to maintain adequate dietary potassium and calcium preferably from fresh fruits, vegetables, and low-fat dairy products.    Stressed the importance of regular exercise  Injury prevention: Discussed safety belts, safety helmets, smoke detector, smoking near bedding or upholstery.   Dental health: Discussed importance of regular tooth brushing, flossing, and dental visits.    NEXT PREVENTATIVE PHYSICAL DUE IN 1 YEAR. Return in about 6 months (around 10/09/2023) for MOOD, PAIN, HTN, THYROID.

## 2023-04-10 NOTE — Assessment & Plan Note (Signed)
In 2005.  Chronic, ongoing with lifelong need for anticoagulation.  Continue Eliquis and send refills as needed.  Monitor CBC regularly and kidney function.   

## 2023-04-10 NOTE — Assessment & Plan Note (Signed)
Chronic, ongoing with history of childhood trauma.  Denies SI/HI.  Followed by Dr. Holland Falling at [facility] in [facility] for both psychiatry and pain management.  Continue this collaboration and medication as ordered by him, she is aware PCP does not perform chronic pain management and refills must come from pain management provider for all current psychiatric medications and pain medication.  Agrees with this plan of care.

## 2023-04-10 NOTE — Assessment & Plan Note (Signed)
Ongoing on labs. Continue to monitor labs and focus on diet changes at home.  May benefit from medication in future -- dependent on family history and overall CAD risk. The 10-year ASCVD risk score (Arnett DK, et al., 2019) is: 1.8%   Values used to calculate the score:     Age: 57 years     Sex: Female     Is Non-Hispanic African American: No     Diabetic: No     Tobacco smoker: No     Systolic Blood Pressure: 122 mmHg     Is BP treated: No     HDL Cholesterol: 58 mg/dL     Total Cholesterol: 150 mg/dL

## 2023-04-10 NOTE — Assessment & Plan Note (Signed)
Chronic, stable with history of right lobectomy 08/07/2019.  At this time will monitor thyroid levels in office and adjust dose of Levothyroxine as needed.  Labs: TSH and Free T4.

## 2023-04-10 NOTE — Assessment & Plan Note (Signed)
Noted on recent labs, followed by pulmonary in past.  Will continue to monitor.  Suspected to be related to hiatal hernia repair on review of past notes.

## 2023-04-10 NOTE — Assessment & Plan Note (Signed)
Chronic, ongoing, continues on Ambien which is written for by Dr. Holland Falling at [facility] in [facility] -- both psychiatry and pain management provider.  Continue this collaboration and current medications as prescribed by him.

## 2023-04-10 NOTE — Assessment & Plan Note (Signed)
History of, with recent levels stable.  Will continue to monitor and return to hematology as needed based on labs.  Obtain labs today.

## 2023-04-10 NOTE — Assessment & Plan Note (Signed)
A1c 5.4% last check, well below prediabetes level.  Continue current diet and exercise focus.  Urine ALB 30 last check, allergic reaction in ACE in past, could consider ARB trial in future.

## 2023-04-11 ENCOUNTER — Other Ambulatory Visit: Payer: Self-pay | Admitting: Nurse Practitioner

## 2023-04-11 LAB — T4, FREE: Free T4: 1.34 ng/dL (ref 0.82–1.77)

## 2023-04-11 LAB — COMPREHENSIVE METABOLIC PANEL
ALT: 11 [IU]/L (ref 0–32)
AST: 18 [IU]/L (ref 0–40)
Albumin: 4.3 g/dL (ref 3.8–4.9)
Alkaline Phosphatase: 131 [IU]/L — ABNORMAL HIGH (ref 44–121)
BUN/Creatinine Ratio: 8 — ABNORMAL LOW (ref 9–23)
BUN: 7 mg/dL (ref 6–24)
Bilirubin Total: <0.2 mg/dL (ref 0.0–1.2)
CO2: 28 mmol/L (ref 20–29)
Calcium: 9.6 mg/dL (ref 8.7–10.2)
Chloride: 104 mmol/L (ref 96–106)
Creatinine, Ser: 0.88 mg/dL (ref 0.57–1.00)
Globulin, Total: 2.3 g/dL (ref 1.5–4.5)
Glucose: 81 mg/dL (ref 70–99)
Potassium: 3.9 mmol/L (ref 3.5–5.2)
Sodium: 144 mmol/L (ref 134–144)
Total Protein: 6.6 g/dL (ref 6.0–8.5)
eGFR: 76 mL/min/{1.73_m2} (ref >59)

## 2023-04-11 LAB — CBC WITH DIFFERENTIAL/PLATELET
Basophils Absolute: 0 10*3/uL (ref 0.0–0.2)
Basos: 0 %
EOS (ABSOLUTE): 0.2 10*3/uL (ref 0.0–0.4)
Eos: 4 %
Hematocrit: 43.6 % (ref 34.0–46.6)
Hemoglobin: 14.3 g/dL (ref 11.1–15.9)
Immature Grans (Abs): 0 10*3/uL (ref 0.0–0.1)
Immature Granulocytes: 0 %
Lymphocytes Absolute: 1.2 10*3/uL (ref 0.7–3.1)
Lymphs: 26 %
MCH: 30.4 pg (ref 26.6–33.0)
MCHC: 32.8 g/dL (ref 31.5–35.7)
MCV: 93 fL (ref 79–97)
Monocytes Absolute: 0.3 10*3/uL (ref 0.1–0.9)
Monocytes: 6 %
Neutrophils Absolute: 3 10*3/uL (ref 1.4–7.0)
Neutrophils: 64 %
Platelets: 306 10*3/uL (ref 150–450)
RBC: 4.7 x10E6/uL (ref 3.77–5.28)
RDW: 13.2 % (ref 11.7–15.4)
WBC: 4.7 10*3/uL (ref 3.4–10.8)

## 2023-04-11 LAB — HEMOGLOBIN A1C
Est. average glucose Bld gHb Est-mCnc: 111 mg/dL
Hgb A1c MFr Bld: 5.5 % (ref 4.8–5.6)

## 2023-04-11 LAB — LIPID PANEL W/O CHOL/HDL RATIO
Cholesterol, Total: 241 mg/dL — ABNORMAL HIGH (ref 100–199)
HDL: 72 mg/dL (ref >39)
LDL Chol Calc (NIH): 139 mg/dL — ABNORMAL HIGH (ref 0–99)
Triglycerides: 170 mg/dL — ABNORMAL HIGH (ref 0–149)
VLDL Cholesterol Cal: 30 mg/dL (ref 5–40)

## 2023-04-11 LAB — IRON: Iron: 85 ug/dL (ref 27–159)

## 2023-04-11 LAB — TSH: TSH: 0.287 u[IU]/mL — ABNORMAL LOW (ref 0.450–4.500)

## 2023-04-11 LAB — FERRITIN: Ferritin: 135 ng/mL (ref 15–150)

## 2023-04-11 MED ORDER — LEVOTHYROXINE SODIUM 62.5 MCG PO CAPS: 1.0000 | ORAL_CAPSULE | Freq: Every morning | ORAL | 4 refills | Status: DC

## 2023-04-11 NOTE — Progress Notes (Signed)
Contacted via MyChart needs lab only visit in 7 weeks.   Good morning Tracey Perez, your labs have returned: - Kidney function, creatinine and eGFR, remains normal, as is liver function, AST and ALT.  - Lipid panel shows trend up, elevations, this check.  Were you fasting? I would like to recheck this with you fasting in 7 weeks outpatient. - Thyroid labs show TSH being too low, hyperactive, with a normal Free T4.  I would like to adjust your Levothyroxine a little and will send in this dose change of 62.5 MG to pharmacy.  Stop 75 MCG.  We will recheck labs outpatient in 7 weeks. - Remainder of labs stable, including ferritin.  Any questions? Keep being stellar!!  Thank you for allowing me to participate in your care.  I appreciate you. Kindest regards, Racquel Arkin

## 2023-04-13 NOTE — Progress Notes (Signed)
Attempted to reach patient to get scheduled for a 7 week lab only visit, LVM to call office back to get scheduled.  Put in CRM.

## 2023-06-04 ENCOUNTER — Other Ambulatory Visit: Payer: Commercial Managed Care - PPO

## 2023-06-04 DIAGNOSIS — E782 Mixed hyperlipidemia: Secondary | ICD-10-CM

## 2023-06-04 DIAGNOSIS — Z1231 Encounter for screening mammogram for malignant neoplasm of breast: Secondary | ICD-10-CM

## 2023-06-04 DIAGNOSIS — E890 Postprocedural hypothyroidism: Secondary | ICD-10-CM

## 2023-06-05 ENCOUNTER — Encounter: Payer: Self-pay | Admitting: Nurse Practitioner

## 2023-06-05 ENCOUNTER — Other Ambulatory Visit: Payer: Self-pay | Admitting: Nurse Practitioner

## 2023-06-05 LAB — LIPID PANEL W/O CHOL/HDL RATIO
Cholesterol, Total: 185 mg/dL (ref 100–199)
HDL: 65 mg/dL (ref >39)
LDL Chol Calc (NIH): 105 mg/dL — ABNORMAL HIGH (ref 0–99)
Triglycerides: 83 mg/dL (ref 0–149)
VLDL Cholesterol Cal: 15 mg/dL (ref 5–40)

## 2023-06-05 LAB — TSH: TSH: 1.3 u[IU]/mL (ref 0.450–4.500)

## 2023-06-05 NOTE — Progress Notes (Signed)
Contacted via MyChart   Good morning Tracey Perez, your labs have returned.  Thyroid labs stable this check, continues current Levothyroxine dosing.  LDL still a little elevated on lipid panel, but better than last check, continue focus on healthy diet and regular exercise.  Any questions? Keep being stellar!!  Thank you for allowing me to participate in your care.  I appreciate you. Kindest regards, Tracey Perez

## 2023-06-09 ENCOUNTER — Encounter: Payer: Self-pay | Admitting: Nurse Practitioner

## 2023-06-10 ENCOUNTER — Telehealth: Payer: Commercial Managed Care - PPO | Admitting: Nurse Practitioner

## 2023-06-10 ENCOUNTER — Encounter: Payer: Self-pay | Admitting: Nurse Practitioner

## 2023-06-10 VITALS — BP 137/74

## 2023-06-10 DIAGNOSIS — I10 Essential (primary) hypertension: Secondary | ICD-10-CM

## 2023-06-10 MED ORDER — HYDRALAZINE HCL 25 MG PO TABS: 25.0000 mg | ORAL_TABLET | Freq: Every day | ORAL | 0 refills | Status: DC | PRN

## 2023-06-10 NOTE — Assessment & Plan Note (Signed)
Has been stable for years, but with increased stressors recently has had some elevations.  Will restart Hydralazine, but discussed with her will order on a PRN basis, so is having a day where BP is stable she does not need to take, but if having consistent elevations on the day >130/80 she can then take.  She agrees with this plan of care.  Recommend she monitor BP at least a few mornings a week at home and document.  DASH diet at home.

## 2023-06-10 NOTE — Progress Notes (Signed)
BP 137/74    Subjective:    Patient ID: Tracey Perez, female    DOB: 02-Apr-1966, 58 y.o.   MRN: [id number]  HPI: Tracey Perez is a 58 y.o. female  Chief Complaint  Patient presents with   Hypertension   Virtual Visit via Video Note  I connected with Tracey Perez on 06/10/23 at  1:20 PM EST by a video enabled telemedicine application and verified that I am speaking with the correct person using two identifiers.  Location: Patient: home Provider: home   I discussed the limitations of evaluation and management by telemedicine and the availability of in person appointments. The patient expressed understanding and agreed to proceed.  I discussed the assessment and treatment plan with the patient. The patient was provided an opportunity to ask questions and all were answered. The patient agreed with the plan and demonstrated an understanding of the instructions.   The patient was advised to call back or seek an in-person evaluation if the symptoms worsen or if the condition fails to improve as anticipated.  I provided 25 minutes of non-face-to-face time during this encounter.   Marjie Skiff, NP   HYPERTENSION without Chronic Kidney Disease Has started to have elevation in blood pressure.  This morning 158/83 and this afternoon 137/84.  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  She did take Hydralazine in past but has been off it for some time now.  Allergic to ACE. Hypertension status: fluctuating  Satisfied with current treatment? yes Duration of hypertension: chronic BP monitoring frequency:  daily BP range: as above BP medication side effects:  no Medication compliance: good compliance Previous BP meds: Aspirin: no Recurrent headaches: no - these are gone now Visual changes: no Palpitations: no Dyspnea: no Chest pain:  no Lower extremity edema: no Dizzy/lightheaded: no   Relevant past medical, surgical, family and social history reviewed and updated as indicated. Interim medical history since our last visit reviewed. Allergies and medications reviewed and updated.  Review of Systems  Constitutional:  Negative for activity change, appetite change, diaphoresis, fatigue and fever.  Respiratory:  Negative for cough, chest tightness and shortness of breath.   Cardiovascular:  Negative for chest pain, palpitations and leg swelling.  Gastrointestinal: Negative.   Neurological: Negative.   Psychiatric/Behavioral: Negative.      Per HPI unless specifically indicated above     Objective:    BP 137/74   Wt Readings from Last 3 Encounters:  04/10/23 118 lb (53.5 kg)  10/01/22 119 lb 12.8 oz (54.3 kg)  03/24/22 117 lb 1.6 oz (53.1 kg)    Physical Exam Vitals and nursing note reviewed.  Constitutional:      General: She is awake. She is not in acute distress.    Appearance: She is well-developed. She is not ill-appearing or toxic-appearing.  HENT:     Head: Normocephalic.     Right Ear: Hearing normal.     Left Ear: Hearing normal.  Eyes:     General: Lids are normal.        Right eye: No discharge.        Left eye: No discharge.     Conjunctiva/sclera: Conjunctivae normal.  Pulmonary:     Effort: Pulmonary effort is normal. No accessory muscle usage or respiratory distress.  Musculoskeletal:  Cervical back: Normal range of motion.  Neurological:     Mental Status: She is alert and oriented to person, place, and time.  Psychiatric:        Attention and Perception: Attention normal.        Mood and Affect: Mood normal.        Behavior: Behavior normal. Behavior is cooperative.        Thought Content: Thought content normal.        Judgment: Judgment normal.    Results for orders placed or performed in visit on 06/04/23  TSH   Collection Time: 06/04/23  9:08 AM  Result Value Ref Range    TSH 1.300 0.450 - 4.500 uIU/mL  Lipid Panel w/o Chol/HDL Ratio   Collection Time: 06/04/23  9:08 AM  Result Value Ref Range   Cholesterol, Total 185 100 - 199 mg/dL   Triglycerides 83 0 - 149 mg/dL   HDL 65 >02 mg/dL   VLDL Cholesterol Cal 15 5 - 40 mg/dL   LDL Chol Calc (NIH) 725 (H) 0 - 99 mg/dL      Assessment & Plan:   Problem List Items Addressed This Visit       Cardiovascular and Mediastinum   Essential hypertension - Primary   Has been stable for years, but with increased stressors recently has had some elevations.  Will restart Hydralazine, but discussed with her will order on a PRN basis, so is having a day where BP is stable she does not need to take, but if having consistent elevations on the day >130/80 she can then take.  She agrees with this plan of care.  Recommend she monitor BP at least a few mornings a week at home and document.  DASH diet at home.        Relevant Medications   hydrALAZINE (APRESOLINE) 25 MG tablet     Follow up plan: Return for as scheduled in June, sooner if worsening symptoms.

## 2023-06-10 NOTE — Patient Instructions (Signed)

## 2023-07-03 ENCOUNTER — Other Ambulatory Visit: Payer: Self-pay | Admitting: Nurse Practitioner

## 2023-07-03 NOTE — Telephone Encounter (Signed)
 Requested Prescriptions  Pending Prescriptions Disp Refills   apixaban (ELIQUIS) 5 MG TABS tablet [Pharmacy Med Name: ELIQUIS 5MG  TABLETS] 180 tablet 1    Sig: TAKE 1 TABLET(5 MG) BY MOUTH TWICE DAILY     Hematology:  Anticoagulants - apixaban Passed - 07/03/2023  3:23 PM      Passed - PLT in normal range and within 360 days    Platelets  Date Value Ref Range Status  04/10/2023 306 150 - 450 x10E3/uL Final         Passed - HGB in normal range and within 360 days    Hemoglobin  Date Value Ref Range Status  04/10/2023 14.3 11.1 - 15.9 g/dL Final         Passed - HCT in normal range and within 360 days    Hematocrit  Date Value Ref Range Status  04/10/2023 43.6 34.0 - 46.6 % Final         Passed - Cr in normal range and within 360 days    Creatinine, Ser  Date Value Ref Range Status  04/10/2023 0.88 0.57 - 1.00 mg/dL Final         Passed - AST in normal range and within 360 days    AST  Date Value Ref Range Status  04/10/2023 18 0 - 40 IU/L Final         Passed - ALT in normal range and within 360 days    ALT  Date Value Ref Range Status  04/10/2023 11 0 - 32 IU/L Final         Passed - Valid encounter within last 12 months    Recent Outpatient Visits           2 months ago Current moderate episode of major depressive disorder without prior episode (HCC)   [place] [facility] [place], Jolene T, NP   9 months ago Current moderate episode of major depressive disorder without prior episode (HCC)   [place] [facility] [place], Benjamin T, NP   1 year ago Current moderate episode of major depressive disorder without prior episode (HCC)   [place] [facility] [place], Corrie Dandy T, NP   1 year ago Unintended weight loss   [place] [facility] [place], Maunaloa T, NP   1 year ago Unintended weight loss   [place] [facility] [place], Dorie Rank, NP       Future Appointments             In 3  months Cannady, Dorie Rank, NP [place] [facility]

## 2023-10-05 ENCOUNTER — Other Ambulatory Visit: Payer: Self-pay | Admitting: Nurse Practitioner

## 2023-10-07 NOTE — Telephone Encounter (Signed)
 Requested Prescriptions  Pending Prescriptions Disp Refills   hydrALAZINE  (APRESOLINE ) 25 MG tablet [Pharmacy Med Name: HYDRALAZINE   25MG  TABLETS(ORANGE)] 90 tablet 0    Sig: TAKE 1 TABLET BY MOUTH DAILY AS NEEDED FOR BLOOD PRESSURE OVER 130/80     Cardiovascular:  Vasodilators Failed - 10/07/2023 10:55 AM      Failed - ANA Screen, Ifa, Serum in normal range and within 360 days    No results found for: ANA, ANATITER, LABANTI       Passed - HCT in normal range and within 360 days    Hematocrit  Date Value Ref Range Status  04/10/2023 43.6 34.0 - 46.6 % Final         Passed - HGB in normal range and within 360 days    Hemoglobin  Date Value Ref Range Status  04/10/2023 14.3 11.1 - 15.9 g/dL Final         Passed - RBC in normal range and within 360 days    RBC  Date Value Ref Range Status  04/10/2023 4.70 3.77 - 5.28 x10E6/uL Final  01/10/2022 4.46 3.87 - 5.11 MIL/uL Final         Passed - WBC in normal range and within 360 days    WBC  Date Value Ref Range Status  04/10/2023 4.7 3.4 - 10.8 x10E3/uL Final  01/10/2022 6.2 4.0 - 10.5 K/uL Final         Passed - PLT in normal range and within 360 days    Platelets  Date Value Ref Range Status  04/10/2023 306 150 - 450 x10E3/uL Final         Passed - Last BP in normal range    BP Readings from Last 1 Encounters:  06/10/23 137/74         Passed - Valid encounter within last 12 months    Recent Outpatient Visits           3 months ago Essential hypertension   [place] [facility] [place], Lavelle Posey, NP       Future Appointments             In 5 days Cannady, Jolene T, NP [place] [facility]

## 2023-10-10 NOTE — Patient Instructions (Signed)
 Be Involved in Caring For Your Health:  Taking Medications When medications are taken as directed, they can greatly improve your health. But if they are not taken as prescribed, they may not work. In some cases, not taking them correctly can be harmful. To help ensure your treatment remains effective and safe, understand your medications and how to take them. Bring your medications to each visit for review by your provider.  Your lab results, notes, and after visit summary will be available on My Chart. We strongly encourage you to use this feature. If lab results are abnormal the clinic will contact you with the appropriate steps. If the clinic does not contact you assume the results are satisfactory. You can always view your results on My Chart. If you have questions regarding your health or results, please contact the clinic during office hours. You can also ask questions on My Chart.  We at [facility] are grateful that you chose [place] to provide your care. We strive to provide evidence-based and compassionate care and are always looking for feedback. If you get a survey from the clinic please complete this so we can hear your opinions.  Heart-Healthy Eating Plan Many factors influence your heart health, including eating and exercise habits. Heart health is also called coronary health. Coronary risk increases with abnormal blood fat (lipid) levels. A heart-healthy eating plan includes limiting unhealthy fats, increasing healthy fats, limiting salt (sodium) intake, and making other diet and lifestyle changes. What is my plan? Your health care provider may recommend that: You limit your fat intake to _________% or less of your total calories each day. You limit your saturated fat intake to _________% or less of your total calories each day. You limit the amount of cholesterol in your diet to less than _________ mg per day. You limit the amount of sodium in your diet to less than _________  mg per day. What are tips for following this plan? Cooking Cook foods using methods other than frying. Baking, boiling, grilling, and broiling are all good options. Other ways to reduce fat include: Removing the skin from poultry. Removing all visible fats from meats. Steaming vegetables in water or broth. Meal planning  At meals, imagine dividing your plate into fourths: Fill one-half of your plate with vegetables and green salads. Fill one-fourth of your plate with whole grains. Fill one-fourth of your plate with lean protein foods. Eat 2-4 cups of vegetables per day. One cup of vegetables equals 1 cup (91 g) broccoli or cauliflower florets, 2 medium carrots, 1 large bell pepper, 1 large sweet potato, 1 large tomato, 1 medium white potato, 2 cups (150 g) raw leafy greens. Eat 1-2 cups of fruit per day. One cup of fruit equals 1 small apple, 1 large banana, 1 cup (237 g) mixed fruit, 1 large orange,  cup (82 g) dried fruit, 1 cup (240 mL) 100% fruit juice. Eat more foods that contain soluble fiber. Examples include apples, broccoli, carrots, beans, peas, and barley. Aim to get 25-30 g of fiber per day. Increase your consumption of legumes, nuts, and seeds to 4-5 servings per week. One serving of dried beans or legumes equals  cup (90 g) cooked, 1 serving of nuts is  oz (12 almonds, 24 pistachios, or 7 walnut halves), and 1 serving of seeds equals  oz (8 g). Fats Choose healthy fats more often. Choose monounsaturated and polyunsaturated fats, such as olive and canola oils, avocado oil, flaxseeds, walnuts, almonds, and seeds. Eat  more omega-3 fats. Choose salmon, mackerel, sardines, tuna, flaxseed oil, and ground flaxseeds. Aim to eat fish at least 2 times each week. Check food labels carefully to identify foods with trans fats or high amounts of saturated fat. Limit saturated fats. These are found in animal products, such as meats, butter, and cream. Plant sources of saturated fats  include palm oil, palm kernel oil, and coconut oil. Avoid foods with partially hydrogenated oils in them. These contain trans fats. Examples are stick margarine, some tub margarines, cookies, crackers, and other baked goods. Avoid fried foods. General information Eat more home-cooked food and less restaurant, buffet, and fast food. Limit or avoid alcohol. Limit foods that are high in added sugar and simple starches such as foods made using white refined flour (white breads, pastries, sweets). Lose weight if you are overweight. Losing just 5-10% of your body weight can help your overall health and prevent diseases such as diabetes and heart disease. Monitor your sodium intake, especially if you have high blood pressure. Talk with your health care provider about your sodium intake. Try to incorporate more vegetarian meals weekly. What foods should I eat? Fruits All fresh, canned (in natural juice), or frozen fruits. Vegetables Fresh or frozen vegetables (raw, steamed, roasted, or grilled). Green salads. Grains Most grains. Choose whole wheat and whole grains most of the time. Rice and pasta, including brown rice and pastas made with whole wheat. Meats and other proteins Lean, well-trimmed beef, veal, pork, and lamb. Chicken and [place] without skin. All fish and shellfish. Wild duck, rabbit, pheasant, and venison. Egg whites or low-cholesterol egg substitutes. Dried beans, peas, lentils, and tofu. Seeds and most nuts. Dairy Low-fat or nonfat cheeses, including ricotta and mozzarella. Skim or 1% milk (liquid, powdered, or evaporated). Buttermilk made with low-fat milk. Nonfat or low-fat yogurt. Fats and oils Non-hydrogenated (trans-free) margarines. Vegetable oils, including soybean, sesame, sunflower, olive, avocado, peanut, safflower, corn, canola, and cottonseed. Salad dressings or mayonnaise made with a vegetable oil. Beverages Water (mineral or sparkling). Coffee and tea. Unsweetened ice  tea. Diet beverages. Sweets and desserts Sherbet, gelatin, and fruit ice. Small amounts of dark chocolate. Limit all sweets and desserts. Seasonings and condiments All seasonings and condiments. The items listed above may not be a complete list of foods and beverages you can eat. Contact a dietitian for more options. What foods should I avoid? Fruits Canned fruit in heavy syrup. Fruit in cream or butter sauce. Fried fruit. Limit coconut. Vegetables Vegetables cooked in cheese, cream, or butter sauce. Fried vegetables. Grains Breads made with saturated or trans fats, oils, or whole milk. Croissants. Sweet rolls. Donuts. High-fat crackers, such as cheese crackers and chips. Meats and other proteins Fatty meats, such as hot dogs, ribs, sausage, bacon, rib-eye roast or steak. High-fat deli meats, such as salami and bologna. Caviar. Domestic duck and goose. Organ meats, such as liver. Dairy Cream, sour cream, cream cheese, and creamed cottage cheese. Whole-milk cheeses. Whole or 2% milk (liquid, evaporated, or condensed). Whole buttermilk. Cream sauce or high-fat cheese sauce. Whole-milk yogurt. Fats and oils Meat fat, or shortening. Cocoa butter, hydrogenated oils, palm oil, coconut oil, palm kernel oil. Solid fats and shortenings, including bacon fat, salt pork, lard, and butter. Nondairy cream substitutes. Salad dressings with cheese or sour cream. Beverages Regular sodas and any drinks with added sugar. Sweets and desserts Frosting. Pudding. Cookies. Cakes. Pies. Milk chocolate or white chocolate. Buttered syrups. Full-fat ice cream or ice cream drinks. The items listed above may  not be a complete list of foods and beverages to avoid. Contact a dietitian for more information. Summary Heart-healthy meal planning includes limiting unhealthy fats, increasing healthy fats, limiting salt (sodium) intake and making other diet and lifestyle changes. Lose weight if you are overweight. Losing just  5-10% of your body weight can help your overall health and prevent diseases such as diabetes and heart disease. Focus on eating a balance of foods, including fruits and vegetables, low-fat or nonfat dairy, lean protein, nuts and legumes, whole grains, and heart-healthy oils and fats. This information is not intended to replace advice given to you by your health care provider. Make sure you discuss any questions you have with your health care provider. Document Revised: 05/13/2021 Document Reviewed: 05/13/2021 Elsevier Patient Education  2024 [employer].

## 2023-10-12 ENCOUNTER — Ambulatory Visit: Payer: Self-pay | Admitting: Nurse Practitioner

## 2023-10-12 ENCOUNTER — Encounter: Payer: Self-pay | Admitting: Nurse Practitioner

## 2023-10-12 VITALS — BP 119/81 | HR 99 | Temp 99.1°F | Ht 63.4 in | Wt 112.8 lb

## 2023-10-12 DIAGNOSIS — E782 Mixed hyperlipidemia: Secondary | ICD-10-CM | POA: Diagnosis not present

## 2023-10-12 DIAGNOSIS — F5104 Psychophysiologic insomnia: Secondary | ICD-10-CM

## 2023-10-12 DIAGNOSIS — R7303 Prediabetes: Secondary | ICD-10-CM

## 2023-10-12 DIAGNOSIS — E890 Postprocedural hypothyroidism: Secondary | ICD-10-CM

## 2023-10-12 DIAGNOSIS — D6869 Other thrombophilia: Secondary | ICD-10-CM

## 2023-10-12 DIAGNOSIS — G894 Chronic pain syndrome: Secondary | ICD-10-CM

## 2023-10-12 DIAGNOSIS — I10 Essential (primary) hypertension: Secondary | ICD-10-CM | POA: Diagnosis not present

## 2023-10-12 DIAGNOSIS — F321 Major depressive disorder, single episode, moderate: Secondary | ICD-10-CM | POA: Diagnosis not present

## 2023-10-12 DIAGNOSIS — Z86718 Personal history of other venous thrombosis and embolism: Secondary | ICD-10-CM

## 2023-10-12 LAB — BAYER DCA HB A1C WAIVED: HB A1C (BAYER DCA - WAIVED): 5.6 % (ref 4.8–5.6)

## 2023-10-12 NOTE — Assessment & Plan Note (Signed)
 A1c today is 5.6%.  Continue current diet and exercise focus.  Urine ALB 30 last check, allergic reaction in ACE in past, could consider ARB trial in future.

## 2023-10-12 NOTE — Assessment & Plan Note (Signed)
 Chronic and stable at present without medication.  Will continue diet focus.  Recommend she monitor BP at least a few mornings a week at home and document.  DASH diet at home.  Continue current medication regimen and adjust as needed.  Labs today: CMP.  Return in 6 months.

## 2023-10-12 NOTE — Assessment & Plan Note (Signed)
In 2005.  Chronic, ongoing with lifelong need for anticoagulation.  Continue Eliquis and send refills as needed.  Monitor CBC regularly and kidney function.   

## 2023-10-12 NOTE — Assessment & Plan Note (Signed)
Chronic, ongoing, followed by Dr. David Marks at [facility] in [place].  Continue this collaboration and medication as ordered by him, she is aware PCP does not perform chronic pain management and refills must come from pain management provider.   ?

## 2023-10-12 NOTE — Assessment & Plan Note (Signed)
Chronic, ongoing with history of childhood trauma.  Denies SI/HI.  Followed by Dr. Holland Falling at [facility] in [facility] for both psychiatry and pain management.  Continue this collaboration and medication as ordered by him, she is aware PCP does not perform chronic pain management and refills must come from pain management provider for all current psychiatric medications and pain medication.  Agrees with this plan of care.

## 2023-10-12 NOTE — Assessment & Plan Note (Signed)
 Ongoing on labs. Continue to monitor labs and focus on diet changes at home.  May benefit from medication in future -- dependent on family history and overall CAD risk. The 10-year ASCVD risk score (Arnett DK, et al., 2019) is: 2.6%   Values used to calculate the score:     Age: 58 years     Clincally relevant sex: Female     Is Non-Hispanic African American: No     Diabetic: No     Tobacco smoker: No     Systolic Blood Pressure: 119 mmHg     Is BP treated: Yes     HDL Cholesterol: 65 mg/dL     Total Cholesterol: 185 mg/dL

## 2023-10-12 NOTE — Assessment & Plan Note (Signed)
 Chronic, stable with history of right lobectomy 08/07/2019.  At this time will monitor thyroid  levels in office and adjust dose of Levothyroxine  as needed.  Labs: TSH and Free T4 due to ongoing issues with appetite.

## 2023-10-12 NOTE — Progress Notes (Signed)
 BP 119/81   Pulse 99   Temp 99.1 F (37.3 C) (Oral)   Ht 5' 3.4 (1.61 m)   Wt 112 lb 12.8 oz (51.2 kg)   SpO2 97%   BMI 19.73 kg/m    Subjective:    Patient ID: Tracey Perez, female    DOB: 1966/03/16, 58 y.o.   MRN: [id number]  HPI: Tracey Perez is a 58 y.o. female  Chief Complaint  Patient presents with   Depression   Hypertension   Hypothyroidism   Pain   HYPERTENSION Has Hydralazine  to take as needed for BP >130/80. 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  Less stress.   Hypertension status: controlled  Satisfied with current treatment? yes Duration of hypertension: chronic BP monitoring frequency:  not checking BP range:  BP medication side effects:  no Medication compliance: good compliance Aspirin: no Recurrent headaches: no Visual changes: no Palpitations: no Dyspnea: no Chest pain: no Lower extremity edema: no Dizzy/lightheaded: no  The 10-year ASCVD risk score (Arnett DK, et al., 2019) is: 2.6%   Values used to calculate the score:     Age: 24 years     Clincally relevant sex: Female     Is Non-Hispanic African American: No     Diabetic: No     Tobacco smoker: No     Systolic Blood Pressure: 119 mmHg     Is BP treated: Yes     HDL Cholesterol: 65 mg/dL     Total Cholesterol: 185 mg/dL  HYPOTHYROIDISM Takes Levothyroxine  75 MCG.  Surgery on thyroid  08/08/2019, removed 1/2 of thyroid  due to nodule.   Thyroid  control status:stable Satisfied with current treatment? yes Medication side effects: no Medication compliance: good compliance Etiology of hypothyroidism:  Recent dose adjustment:no Fatigue: at baseline Cold intolerance: yes Heat intolerance: no Weight gain: no Weight loss: no appetite Constipation: no Diarrhea/loose stools: no Palpitations: no Lower extremity edema: no Anxiety/depressed mood: no   Impaired Fasting Glucose HbA1C:  Lab Results  Component Value Date   HGBA1C 5.5 04/10/2023  Duration of elevated blood  sugar:  Polydipsia: no Polyuria: no Weight change: no Visual disturbance: no Glucose Monitoring: no    Accucheck frequency: TID    Fasting glucose:     Post prandial:  Diabetic Education: Not Completed Family history of diabetes: yes   HISTORY OF PE (BILATERAL) & DVT Last pulmonary visit was 07/12/20, to return as needed only. Eliquis  for PE & DVT history -- DVT in 2002 and PE >2 years ago.  Satisfied with current treatment?: yes Dyspnea frequency: none Cough frequency: none Rescue inhaler frequency:  none Limitation of activity: no Productive cough:  Last Spirometry:  Pneumovax: Not up to Date Influenza: Up to Date    DEPRESSION & CHRONIC PAIN Taking Effexor  XR, Mirtazapine , Ambien  for sleep.  Follows with Dr. Alm Norfolk -- pain management and psych ([facility]) -- Gen[facility]ters.  He also does talk therapy.  He fills her Oxycodone , Gabapentin , Tizanidine  + mental health medications.   Mood status: stable Satisfied with current treatment?: yes Symptom severity: moderate  Duration of current treatment : chronic Side effects: no Medication compliance: good compliance Psychotherapy/counseling: yes current Previous psychiatric medications: multiple Depressed mood: yes Anxious mood: yes Anhedonia: no Significant weight loss or gain: no Insomnia: yes hard to fall asleep -- Ambien  Fatigue: yes Feelings of worthlessness or guilt: no Impaired concentration/indecisiveness: no Suicidal ideations: no Hopelessness: no Crying spells: no    10/12/2023   11:16  AM 04/10/2023    8:29 AM 10/01/2022    9:36 AM 03/24/2022    8:31 AM 12/20/2021    8:13 AM  Depression screen PHQ 2/9  Decreased Interest 2 2 2 2 2   Down, Depressed, Hopeless 2 1 1 1 2   PHQ - 2 Score 4 3 3 3 4   Altered sleeping 2 1 2 1 2   Tired, decreased energy 2 2 2 2 2   Change in appetite 3 1 0 1 2  Feeling bad or failure about yourself  1 1 2 1 1   Trouble concentrating 0 1 1 0 0  Moving slowly or fidgety/restless 0  2 0 0 0  Suicidal thoughts 0 1 0 0 0  PHQ-9 Score 12 12 10 8 11   Difficult doing work/chores Somewhat difficult Somewhat difficult Somewhat difficult Somewhat difficult Somewhat difficult       10/12/2023   11:16 AM 04/10/2023    8:29 AM 10/01/2022    9:36 AM 03/24/2022    8:32 AM  GAD 7 : Generalized Anxiety Score  Nervous, Anxious, on Edge 1 2 2 2   Control/stop worrying 1 1 2  0  Worry too much - different things 0 1 1 0  Trouble relaxing 1 2 1 1   Restless 0 1 0 0  Easily annoyed or irritable 0 2 1 2   Afraid - awful might happen 0 1 0 0  Total GAD 7 Score 3 10 7 5   Anxiety Difficulty Somewhat difficult Somewhat difficult Somewhat difficult Somewhat difficult   Relevant past medical, surgical, family and social history reviewed and updated as indicated. Interim medical history since our last visit reviewed. Allergies and medications reviewed and updated.  Review of Systems  Constitutional:  Negative for activity change, appetite change, diaphoresis, fatigue and unexpected weight change.  Respiratory:  Negative for cough, chest tightness, shortness of breath and wheezing.   Cardiovascular:  Negative for chest pain, palpitations and leg swelling.  Gastrointestinal: Negative.   Musculoskeletal:  Positive for arthralgias.  Neurological: Negative.   Psychiatric/Behavioral:  Positive for sleep disturbance. Negative for decreased concentration, self-injury and suicidal ideas. The patient is nervous/anxious.    Per HPI unless specifically indicated above     Objective:    BP 119/81   Pulse 99   Temp 99.1 F (37.3 C) (Oral)   Ht 5' 3.4 (1.61 m)   Wt 112 lb 12.8 oz (51.2 kg)   SpO2 97%   BMI 19.73 kg/m   Wt Readings from Last 3 Encounters:  10/12/23 112 lb 12.8 oz (51.2 kg)  04/10/23 118 lb (53.5 kg)  10/01/22 119 lb 12.8 oz (54.3 kg)    Physical Exam Vitals and nursing note reviewed.  Constitutional:      General: She is awake. She is not in acute distress.     Appearance: She is well-developed and well-groomed. She is not ill-appearing or toxic-appearing.  HENT:     Head: Normocephalic.     Right Ear: Hearing normal.     Left Ear: Hearing normal.   Eyes:     General: Lids are normal.        Right eye: No discharge.        Left eye: No discharge.     Conjunctiva/sclera: Conjunctivae normal.     Pupils: Pupils are equal, round, and reactive to light.   Neck:     Thyroid : No thyromegaly.     Vascular: No carotid bruit.   Cardiovascular:     Rate and Rhythm:  Normal rate and regular rhythm.     Heart sounds: Normal heart sounds. No murmur heard.    No gallop.  Pulmonary:     Effort: Pulmonary effort is normal. No accessory muscle usage or respiratory distress.     Breath sounds: Normal breath sounds.  Abdominal:     General: Bowel sounds are normal. There is no distension.     Palpations: Abdomen is soft.     Tenderness: There is no abdominal tenderness.   Musculoskeletal:     Cervical back: Normal range of motion and neck supple.     Right lower leg: No edema.     Left lower leg: No edema.  Lymphadenopathy:     Cervical: No cervical adenopathy.   Skin:    General: Skin is warm and dry.   Neurological:     Mental Status: She is alert and oriented to person, place, and time.   Psychiatric:        Attention and Perception: Attention normal.        Mood and Affect: Mood normal.        Speech: Speech normal.        Behavior: Behavior normal. Behavior is cooperative.        Thought Content: Thought content normal.    Results for orders placed or performed in visit on 06/04/23  TSH   Collection Time: 06/04/23  9:08 AM  Result Value Ref Range   TSH 1.300 0.450 - 4.500 uIU/mL  Lipid Panel w/o Chol/HDL Ratio   Collection Time: 06/04/23  9:08 AM  Result Value Ref Range   Cholesterol, Total 185 100 - 199 mg/dL   Triglycerides 83 0 - 149 mg/dL   HDL 65 >60 mg/dL   VLDL Cholesterol Cal 15 5 - 40 mg/dL   LDL Chol Calc (NIH) 894  (H) 0 - 99 mg/dL      Assessment & Plan:   Problem List Items Addressed This Visit       Cardiovascular and Mediastinum   Essential hypertension   Chronic and stable at present without medication.  Will continue diet focus.  Recommend she monitor BP at least a few mornings a week at home and document.  DASH diet at home.  Continue current medication regimen and adjust as needed.  Labs today: CMP.  Return in 6 months.       Relevant Orders   Comprehensive metabolic panel with GFR     Endocrine   Postsurgical hypothyroidism   Chronic, stable with history of right lobectomy 08/07/2019.  At this time will monitor thyroid  levels in office and adjust dose of Levothyroxine  as needed.  Labs: TSH and Free T4 due to ongoing issues with appetite.      Relevant Medications   levothyroxine  (SYNTHROID ) 75 MCG tablet   Other Relevant Orders   T4, free   TSH     Hematopoietic and Hemostatic   Other thrombophilia (HCC)   On lifelong Eliquis  due to history of bilateral PE and history of DVT.  Monitor CBC regularly and monitor for increased bruising or bleeding.  Return to hematology as needed.        Other   Prediabetes   A1c today is 5.6%.  Continue current diet and exercise focus.  Urine ALB 30 last check, allergic reaction in ACE in past, could consider ARB trial in future.      Relevant Orders   Bayer DCA Hb A1c Waived   Mixed hyperlipidemia   Ongoing on labs.  Continue to monitor labs and focus on diet changes at home.  May benefit from medication in future -- dependent on family history and overall CAD risk. The 10-year ASCVD risk score (Arnett DK, et al., 2019) is: 2.6%   Values used to calculate the score:     Age: 63 years     Clincally relevant sex: Female     Is Non-Hispanic African American: No     Diabetic: No     Tobacco smoker: No     Systolic Blood Pressure: 119 mmHg     Is BP treated: Yes     HDL Cholesterol: 65 mg/dL     Total Cholesterol: 185 mg/dL        Relevant Orders   Comprehensive metabolic panel with GFR   Lipid Panel w/o Chol/HDL Ratio   Insomnia   Chronic, ongoing, continues on Ambien  which is written for by Dr. Alm Norfolk at [facility] in [place] -- both psychiatry and pain management provider.  Continue this collaboration and current medications as prescribed by him.      History of DVT (deep vein thrombosis)   In 2005.  Chronic, ongoing with lifelong need for anticoagulation.  Continue Eliquis  and send refills as needed.  Monitor CBC regularly and kidney function.        Depression - Primary   Chronic, ongoing with history of childhood trauma.  Denies SI/HI.  Followed by Dr. Alm Norfolk at [facility] in [facility] for both psychiatry and pain management.  Continue this collaboration and medication as ordered by him, she is aware PCP does not perform chronic pain management and refills must come from pain management provider for all current psychiatric medications and pain medication.  Agrees with this plan of care.        Chronic pain syndrome   Chronic, ongoing, followed by Dr. Alm Norfolk at [facility] in [place].  Continue this collaboration and medication as ordered by him, she is aware PCP does not perform chronic pain management and refills must come from pain management provider.          Follow up plan: Return in about 6 months (around 04/12/2024) for HTN, H/O DVT, MOOD, THYROID .

## 2023-10-12 NOTE — Assessment & Plan Note (Signed)
Chronic, ongoing, continues on Ambien which is written for by Dr. Holland Falling at [facility] in [facility] -- both psychiatry and pain management provider.  Continue this collaboration and current medications as prescribed by him.

## 2023-10-12 NOTE — Assessment & Plan Note (Signed)
On lifelong Eliquis due to history of bilateral PE and history of DVT.  Monitor CBC regularly and monitor for increased bruising or bleeding.  Return to hematology as needed. 

## 2023-10-14 ENCOUNTER — Ambulatory Visit: Payer: Self-pay | Admitting: Nurse Practitioner

## 2023-10-14 DIAGNOSIS — E890 Postprocedural hypothyroidism: Secondary | ICD-10-CM

## 2023-10-14 DIAGNOSIS — E876 Hypokalemia: Secondary | ICD-10-CM

## 2023-10-14 LAB — LIPID PANEL W/O CHOL/HDL RATIO
Cholesterol, Total: 211 mg/dL — ABNORMAL HIGH (ref 100–199)
HDL: 64 mg/dL (ref >39)
LDL Chol Calc (NIH): 132 mg/dL — ABNORMAL HIGH (ref 0–99)
Triglycerides: 83 mg/dL (ref 0–149)
VLDL Cholesterol Cal: 15 mg/dL (ref 5–40)

## 2023-10-14 LAB — COMPREHENSIVE METABOLIC PANEL WITH GFR
ALT: 27 IU/L (ref 0–32)
AST: 46 IU/L — ABNORMAL HIGH (ref 0–40)
Albumin: 4.4 g/dL (ref 3.8–4.9)
Alkaline Phosphatase: 112 IU/L (ref 44–121)
BUN/Creatinine Ratio: 12 (ref 9–23)
BUN: 12 mg/dL (ref 6–24)
Bilirubin Total: 0.3 mg/dL (ref 0.0–1.2)
CO2: 25 mmol/L (ref 20–29)
Calcium: 9.7 mg/dL (ref 8.7–10.2)
Chloride: 95 mmol/L — ABNORMAL LOW (ref 96–106)
Creatinine, Ser: 0.98 mg/dL (ref 0.57–1.00)
Globulin, Total: 1.9 g/dL (ref 1.5–4.5)
Glucose: 110 mg/dL — ABNORMAL HIGH (ref 70–99)
Potassium: 2.5 mmol/L — ABNORMAL LOW (ref 3.5–5.2)
Sodium: 139 mmol/L (ref 134–144)
Total Protein: 6.3 g/dL (ref 6.0–8.5)
eGFR: 67 mL/min/{1.73_m2} (ref >59)

## 2023-10-14 LAB — TSH: TSH: 0.238 u[IU]/mL — ABNORMAL LOW (ref 0.450–4.500)

## 2023-10-14 LAB — T4, FREE: Free T4: 2.04 ng/dL — ABNORMAL HIGH (ref 0.82–1.77)

## 2023-10-14 MED ORDER — LEVOTHYROXINE SODIUM 50 MCG PO TABS: 50.0000 ug | ORAL_TABLET | Freq: Every day | ORAL | 3 refills | Status: AC

## 2023-10-14 MED ORDER — POTASSIUM CHLORIDE CRYS ER 20 MEQ PO TBCR: 20.0000 meq | EXTENDED_RELEASE_TABLET | Freq: Every day | ORAL | 0 refills | Status: DC

## 2023-10-14 NOTE — Progress Notes (Signed)
 Contacted via MyChart -- lab only visit on Monday 10/19/23 + a 6 week lab only visit too  Good morning Henya, your labs have returned: - Potassium is quite low at 2.5 -- I am sending in potassium supplement to take daily for next 5 days and please ensure to increase potassium rich foods in diet (bananas, mangoes, dried fruit). Low potassium is concerning because it can affect heart rhythm.  I want to recheck level on Monday. If remains low I may put you on a daily supplement to take. - - Kidney function, creatinine and eGFR, remains normal. Liver function shows very mild elevation in AST, but normal ALT.  We will monitor this closely. - Lipid panel continues to show elevations, overall risk score on calculation is low however with you history you may benefit statin in future. - Thyroid  level shows TSH too low and Free T4 too high -- this means your thyroid  is too busy (hyperactive).  We need to stop 75 MCG Levothyroxine  and start a lower dose of 50 MCG.  Then we will recheck labs outpatient in 6 weeks.  Hyperactive can cause more weight loss.  Any questions? Please let me know you received this message.  Thank you:) Keep being amazing!!  Thank you for allowing me to participate in your care.  I appreciate you. Kindest regards, Saray Capasso

## 2023-10-26 NOTE — Progress Notes (Signed)
 Called patient, left message for patient to call back and schedule lab appts

## 2023-10-30 ENCOUNTER — Other Ambulatory Visit: Payer: Self-pay | Admitting: Nurse Practitioner

## 2023-10-30 DIAGNOSIS — E890 Postprocedural hypothyroidism: Secondary | ICD-10-CM

## 2023-11-02 NOTE — Telephone Encounter (Signed)
 Requested Prescriptions  Pending Prescriptions Disp Refills   levothyroxine  (SYNTHROID ) 75 MCG tablet [Pharmacy Med Name: LEVOTHYROXINE  0.075MG  ( ) TABS] 90 tablet 4    Sig: TAKE 1 TABLET BY MOUTH EVERY DAY BEFORE BREAKFAST     Endocrinology:  Hypothyroid Agents Failed - 11/02/2023  7:56 AM      Failed - TSH in normal range and within 360 days    TSH  Date Value Ref Range Status  10/12/2023 0.238 (L) 0.450 - 4.500 uIU/mL Final         Passed - Valid encounter within last 12 months    Recent Outpatient Visits           3 weeks ago Current moderate episode of major depressive disorder without prior episode (HCC)   [place] [facility] [place], Melanie T, NP   4 months ago Essential hypertension   [place] [facility] [place], Melanie DASEN, NP

## 2023-12-09 ENCOUNTER — Other Ambulatory Visit

## 2023-12-09 DIAGNOSIS — E890 Postprocedural hypothyroidism: Secondary | ICD-10-CM

## 2023-12-09 DIAGNOSIS — E876 Hypokalemia: Secondary | ICD-10-CM

## 2023-12-10 ENCOUNTER — Ambulatory Visit: Payer: Self-pay | Admitting: Nurse Practitioner

## 2023-12-10 DIAGNOSIS — E890 Postprocedural hypothyroidism: Secondary | ICD-10-CM

## 2023-12-10 LAB — BASIC METABOLIC PANEL WITH GFR
BUN/Creatinine Ratio: 15 (ref 9–23)
BUN: 13 mg/dL (ref 6–24)
CO2: 25 mmol/L (ref 20–29)
Calcium: 9.5 mg/dL (ref 8.7–10.2)
Chloride: 104 mmol/L (ref 96–106)
Creatinine, Ser: 0.85 mg/dL (ref 0.57–1.00)
Glucose: 69 mg/dL — ABNORMAL LOW (ref 70–99)
Potassium: 3.9 mmol/L (ref 3.5–5.2)
Sodium: 142 mmol/L (ref 134–144)
eGFR: 79 mL/min/1.73 (ref >59)

## 2023-12-10 LAB — T4, FREE: Free T4: 1.08 ng/dL (ref 0.82–1.77)

## 2023-12-10 LAB — TSH: TSH: 4.71 u[IU]/mL — ABNORMAL HIGH (ref 0.450–4.500)

## 2023-12-11 NOTE — Progress Notes (Signed)
 Called patient and left a message for her to call back to get scheduled for labs in 3 weeks.

## 2023-12-14 NOTE — Progress Notes (Signed)
 Called patient and left a message for her to call back to get scheduled for labs in 4 weeks.

## 2023-12-16 NOTE — Progress Notes (Signed)
 Scheduled

## 2024-01-01 ENCOUNTER — Other Ambulatory Visit: Payer: Self-pay | Admitting: Nurse Practitioner

## 2024-01-01 NOTE — Telephone Encounter (Signed)
 Labs in date.  Requested Prescriptions  Pending Prescriptions Disp Refills   ELIQUIS  5 MG TABS tablet [Pharmacy Med Name: ELIQUIS  5MG  TABLETS] 180 tablet 1    Sig: TAKE 1 TABLET(5 MG) BY MOUTH TWICE DAILY     Hematology:  Anticoagulants - apixaban  Failed - 01/01/2024  3:41 PM      Failed - AST in normal range and within 360 days    AST  Date Value Ref Range Status  10/12/2023 46 (H) 0 - 40 IU/L Final         Passed - PLT in normal range and within 360 days    Platelets  Date Value Ref Range Status  04/10/2023 306 150 - 450 x10E3/uL Final         Passed - HGB in normal range and within 360 days    Hemoglobin  Date Value Ref Range Status  04/10/2023 14.3 11.1 - 15.9 g/dL Final         Passed - HCT in normal range and within 360 days    Hematocrit  Date Value Ref Range Status  04/10/2023 43.6 34.0 - 46.6 % Final         Passed - Cr in normal range and within 360 days    Creatinine, Ser  Date Value Ref Range Status  12/09/2023 0.85 0.57 - 1.00 mg/dL Final         Passed - ALT in normal range and within 360 days    ALT  Date Value Ref Range Status  10/12/2023 27 0 - 32 IU/L Final         Passed - Valid encounter within last 12 months    Recent Outpatient Visits           2 months ago Current moderate episode of major depressive disorder without prior episode (HCC)   [place] [facility] [place], Melanie DASEN, NP   6 months ago Essential hypertension   [place] [facility] [place], Melanie DASEN, NP

## 2024-01-02 ENCOUNTER — Other Ambulatory Visit: Payer: Self-pay | Admitting: Nurse Practitioner

## 2024-01-05 NOTE — Telephone Encounter (Signed)
 Requested medications are due for refill today.  yes  Requested medications are on the active medications list.  yes  Last refill. 10/07/2023 #90 0 rf  Future visit scheduled.   yes  Notes to clinic.  Missing labs.    Requested Prescriptions  Pending Prescriptions Disp Refills   hydrALAZINE  (APRESOLINE ) 25 MG tablet [Pharmacy Med Name: HYDRALAZINE   25MG  TABLETS(ORANGE)] 90 tablet 0    Sig: TAKE 1 TABLET BY MOUTH DAILY AS NEEDED FOR BLOOD PRESSURE OVER 130/80     Cardiovascular:  Vasodilators Failed - 01/05/2024  8:39 AM      Failed - ANA Screen, Ifa, Serum in normal range and within 360 days    No results found for: ANA, ANATITER, LABANTI       Passed - HCT in normal range and within 360 days    Hematocrit  Date Value Ref Range Status  04/10/2023 43.6 34.0 - 46.6 % Final         Passed - HGB in normal range and within 360 days    Hemoglobin  Date Value Ref Range Status  04/10/2023 14.3 11.1 - 15.9 g/dL Final         Passed - RBC in normal range and within 360 days    RBC  Date Value Ref Range Status  04/10/2023 4.70 3.77 - 5.28 x10E6/uL Final  01/10/2022 4.46 3.87 - 5.11 MIL/uL Final         Passed - WBC in normal range and within 360 days    WBC  Date Value Ref Range Status  04/10/2023 4.7 3.4 - 10.8 x10E3/uL Final  01/10/2022 6.2 4.0 - 10.5 K/uL Final         Passed - PLT in normal range and within 360 days    Platelets  Date Value Ref Range Status  04/10/2023 306 150 - 450 x10E3/uL Final         Passed - Last BP in normal range    BP Readings from Last 1 Encounters:  10/12/23 119/81         Passed - Valid encounter within last 12 months    Recent Outpatient Visits           2 months ago Current moderate episode of major depressive disorder without prior episode (HCC)   [place] [facility] [place], Melanie T, NP   6 months ago Essential hypertension   [place] [facility] [place], Melanie DASEN, NP

## 2024-01-05 NOTE — Telephone Encounter (Signed)
 Spoke with patient, she confirms she is no longer taking this.

## 2024-01-12 ENCOUNTER — Other Ambulatory Visit

## 2024-01-12 DIAGNOSIS — E890 Postprocedural hypothyroidism: Secondary | ICD-10-CM

## 2024-01-13 ENCOUNTER — Ambulatory Visit: Payer: Self-pay | Admitting: Nurse Practitioner

## 2024-01-13 LAB — TSH: TSH: 1.17 u[IU]/mL (ref 0.450–4.500)

## 2024-01-13 LAB — T4, FREE: Free T4: 0.97 ng/dL (ref 0.82–1.77)

## 2024-01-13 NOTE — Progress Notes (Signed)
 Contacted via MyChart  Thyroid  levels improved and normal, continue current medication dosing.:)

## 2024-01-15 ENCOUNTER — Encounter: Payer: Self-pay | Admitting: Nurse Practitioner

## 2024-01-16 ENCOUNTER — Emergency Department

## 2024-01-16 ENCOUNTER — Other Ambulatory Visit: Payer: Self-pay

## 2024-01-16 ENCOUNTER — Inpatient Hospital Stay
Admission: EM | Admit: 2024-01-16 | Discharge: 2024-01-18 | DRG: 193 | Disposition: A | Discharge status: Home / Self Care | Attending: Obstetrics and Gynecology | Admitting: Obstetrics and Gynecology

## 2024-01-16 DIAGNOSIS — Z87891 Personal history of nicotine dependence: Secondary | ICD-10-CM | POA: Diagnosis not present

## 2024-01-16 DIAGNOSIS — Z886 Allergy status to analgesic agent status: Secondary | ICD-10-CM | POA: Diagnosis not present

## 2024-01-16 DIAGNOSIS — Z79899 Other long term (current) drug therapy: Secondary | ICD-10-CM

## 2024-01-16 DIAGNOSIS — J9601 Acute respiratory failure with hypoxia: Secondary | ICD-10-CM | POA: Diagnosis present

## 2024-01-16 DIAGNOSIS — Z7901 Long term (current) use of anticoagulants: Secondary | ICD-10-CM | POA: Diagnosis not present

## 2024-01-16 DIAGNOSIS — J129 Viral pneumonia, unspecified: Secondary | ICD-10-CM | POA: Diagnosis present

## 2024-01-16 DIAGNOSIS — J189 Pneumonia, unspecified organism: Secondary | ICD-10-CM | POA: Diagnosis not present

## 2024-01-16 DIAGNOSIS — Z90710 Acquired absence of both cervix and uterus: Secondary | ICD-10-CM | POA: Diagnosis not present

## 2024-01-16 DIAGNOSIS — Z66 Do not resuscitate: Secondary | ICD-10-CM | POA: Diagnosis present

## 2024-01-16 DIAGNOSIS — Z885 Allergy status to narcotic agent status: Secondary | ICD-10-CM | POA: Diagnosis not present

## 2024-01-16 DIAGNOSIS — D649 Anemia, unspecified: Secondary | ICD-10-CM | POA: Diagnosis present

## 2024-01-16 DIAGNOSIS — Z91048 Other nonmedicinal substance allergy status: Secondary | ICD-10-CM

## 2024-01-16 DIAGNOSIS — F329 Major depressive disorder, single episode, unspecified: Secondary | ICD-10-CM | POA: Diagnosis present

## 2024-01-16 DIAGNOSIS — Z86711 Personal history of pulmonary embolism: Secondary | ICD-10-CM | POA: Diagnosis present

## 2024-01-16 DIAGNOSIS — B9789 Other viral agents as the cause of diseases classified elsewhere: Secondary | ICD-10-CM | POA: Diagnosis present

## 2024-01-16 DIAGNOSIS — E782 Mixed hyperlipidemia: Secondary | ICD-10-CM | POA: Diagnosis present

## 2024-01-16 DIAGNOSIS — Z8616 Personal history of COVID-19: Secondary | ICD-10-CM

## 2024-01-16 DIAGNOSIS — G894 Chronic pain syndrome: Secondary | ICD-10-CM | POA: Diagnosis present

## 2024-01-16 DIAGNOSIS — R0902 Hypoxemia: Secondary | ICD-10-CM

## 2024-01-16 DIAGNOSIS — Z818 Family history of other mental and behavioral disorders: Secondary | ICD-10-CM | POA: Diagnosis not present

## 2024-01-16 DIAGNOSIS — Z8249 Family history of ischemic heart disease and other diseases of the circulatory system: Secondary | ICD-10-CM | POA: Diagnosis not present

## 2024-01-16 DIAGNOSIS — E876 Hypokalemia: Secondary | ICD-10-CM | POA: Diagnosis present

## 2024-01-16 DIAGNOSIS — R0602 Shortness of breath: Secondary | ICD-10-CM

## 2024-01-16 DIAGNOSIS — I10 Essential (primary) hypertension: Secondary | ICD-10-CM | POA: Diagnosis present

## 2024-01-16 DIAGNOSIS — Z888 Allergy status to other drugs, medicaments and biological substances status: Secondary | ICD-10-CM

## 2024-01-16 DIAGNOSIS — Z1152 Encounter for screening for COVID-19: Secondary | ICD-10-CM | POA: Diagnosis not present

## 2024-01-16 DIAGNOSIS — E890 Postprocedural hypothyroidism: Secondary | ICD-10-CM | POA: Diagnosis present

## 2024-01-16 DIAGNOSIS — R079 Chest pain, unspecified: Principal | ICD-10-CM

## 2024-01-16 DIAGNOSIS — K219 Gastro-esophageal reflux disease without esophagitis: Secondary | ICD-10-CM | POA: Diagnosis present

## 2024-01-16 DIAGNOSIS — Z882 Allergy status to sulfonamides status: Secondary | ICD-10-CM

## 2024-01-16 DIAGNOSIS — Z8349 Family history of other endocrine, nutritional and metabolic diseases: Secondary | ICD-10-CM

## 2024-01-16 DIAGNOSIS — Z86718 Personal history of other venous thrombosis and embolism: Secondary | ICD-10-CM | POA: Diagnosis not present

## 2024-01-16 DIAGNOSIS — Z79890 Hormone replacement therapy (postmenopausal): Secondary | ICD-10-CM

## 2024-01-16 LAB — BASIC METABOLIC PANEL WITH GFR
Anion gap: 11 (ref 5–15)
Anion gap: 9 (ref 5–15)
BUN: 11 mg/dL (ref 6–20)
BUN: 10 mg/dL (ref 6–20)
CO2: 30 mmol/L (ref 22–32)
CO2: 29 mmol/L (ref 22–32)
Calcium: 8.6 mg/dL — ABNORMAL LOW (ref 8.9–10.3)
Calcium: 8.3 mg/dL — ABNORMAL LOW (ref 8.9–10.3)
Chloride: 97 mmol/L — ABNORMAL LOW (ref 98–111)
Chloride: 98 mmol/L (ref 98–111)
Creatinine, Ser: 0.88 mg/dL (ref 0.44–1.00)
Creatinine, Ser: 0.57 mg/dL (ref 0.44–1.00)
GFR, Estimated: >60 mL/min (ref >60)
GFR, Estimated: >60 mL/min (ref >60)
Glucose, Bld: 115 mg/dL — ABNORMAL HIGH (ref 70–99)
Glucose, Bld: 98 mg/dL (ref 70–99)
Potassium: 2.4 mmol/L — CL (ref 3.5–5.1)
Potassium: 3.6 mmol/L (ref 3.5–5.1)
Sodium: 138 mmol/L (ref 135–145)
Sodium: 136 mmol/L (ref 135–145)

## 2024-01-16 LAB — HEPATIC FUNCTION PANEL
ALT: 14 U/L (ref 0–44)
AST: 35 U/L (ref 15–41)
Albumin: 3 g/dL — ABNORMAL LOW (ref 3.5–5.0)
Alkaline Phosphatase: 75 U/L (ref 38–126)
Bilirubin, Direct: <0.1 mg/dL (ref 0.0–0.2)
Total Bilirubin: 0.3 mg/dL (ref 0.0–1.2)
Total Protein: 6.2 g/dL — ABNORMAL LOW (ref 6.5–8.1)

## 2024-01-16 LAB — BRAIN NATRIURETIC PEPTIDE: B Natriuretic Peptide: 41.5 pg/mL (ref 0.0–100.0)

## 2024-01-16 LAB — CBC
HCT: 31.6 % — ABNORMAL LOW (ref 36.0–46.0)
Hemoglobin: 11.2 g/dL — ABNORMAL LOW (ref 12.0–15.0)
MCH: 31.4 pg (ref 26.0–34.0)
MCHC: 35.4 g/dL (ref 30.0–36.0)
MCV: 88.5 fL (ref 80.0–100.0)
Platelets: 198 K/uL (ref 150–400)
RBC: 3.57 MIL/uL — ABNORMAL LOW (ref 3.87–5.11)
RDW: 12.9 % (ref 11.5–15.5)
WBC: 7.7 K/uL (ref 4.0–10.5)
nRBC: 0 % (ref 0.0–0.2)

## 2024-01-16 LAB — RESP PANEL BY RT-PCR (RSV, FLU A&B, COVID)  RVPGX2
Influenza A by PCR: NEGATIVE
Influenza B by PCR: NEGATIVE
Resp Syncytial Virus by PCR: NEGATIVE
SARS Coronavirus 2 by RT PCR: NEGATIVE

## 2024-01-16 LAB — MAGNESIUM: Magnesium: 1.8 mg/dL (ref 1.7–2.4)

## 2024-01-16 LAB — TROPONIN I (HIGH SENSITIVITY): Troponin I (High Sensitivity): 5 ng/L (ref <18)

## 2024-01-16 LAB — LACTIC ACID, PLASMA
Lactic Acid, Venous: 0.6 mmol/L (ref 0.5–1.9)
Lactic Acid, Venous: 0.5 mmol/L (ref 0.5–1.9)

## 2024-01-16 LAB — HIV ANTIBODY (ROUTINE TESTING W REFLEX): HIV Screen 4th Generation wRfx: NONREACTIVE

## 2024-01-16 LAB — PROCALCITONIN: Procalcitonin: 0.15 ng/mL

## 2024-01-16 LAB — PROTIME-INR
INR: 1.4 — ABNORMAL HIGH (ref 0.8–1.2)
Prothrombin Time: 17.4 s — ABNORMAL HIGH (ref 11.4–15.2)

## 2024-01-16 MED ORDER — SODIUM CHLORIDE 0.9 % IV SOLN
2.0000 g | INTRAVENOUS | Status: DC
  Administered 2024-01-17: 2 g via INTRAVENOUS
  Filled 2024-01-16: qty 20

## 2024-01-16 MED ORDER — LEVOTHYROXINE SODIUM 50 MCG PO TABS
50.0000 ug | ORAL_TABLET | Freq: Every day | ORAL | Status: DC
  Administered 2024-01-17 – 2024-01-18 (×2): 50 ug via ORAL
  Filled 2024-01-16 (×2): qty 1

## 2024-01-16 MED ORDER — ENOXAPARIN SODIUM 40 MG/0.4ML IJ SOSY: 40.0000 mg | PREFILLED_SYRINGE | INTRAMUSCULAR | Status: DC

## 2024-01-16 MED ORDER — SODIUM CHLORIDE 0.9 % IV SOLN
2.0000 g | Freq: Once | INTRAVENOUS | Status: AC
  Administered 2024-01-16: 2 g via INTRAVENOUS
  Filled 2024-01-16: qty 20

## 2024-01-16 MED ORDER — LACTATED RINGERS IV SOLN: INTRAVENOUS | Status: DC

## 2024-01-16 MED ORDER — METHYLPREDNISOLONE SODIUM SUCC 40 MG IJ SOLR
40.0000 mg | Freq: Two times a day (BID) | INTRAMUSCULAR | Status: DC
Start: 2024-01-16 — End: 2024-01-18
  Administered 2024-01-16 – 2024-01-17 (×2): 40 mg via INTRAVENOUS
  Filled 2024-01-16 (×2): qty 1

## 2024-01-16 MED ORDER — ACETAMINOPHEN 650 MG RE SUPP: 650.0000 mg | Freq: Four times a day (QID) | RECTAL | Status: DC | PRN

## 2024-01-16 MED ORDER — POTASSIUM CHLORIDE 10 MEQ/100ML IV SOLN
10.0000 meq | INTRAVENOUS | Status: AC
  Administered 2024-01-16 (×3): 10 meq via INTRAVENOUS
  Filled 2024-01-16 (×3): qty 100

## 2024-01-16 MED ORDER — GABAPENTIN 400 MG PO CAPS
800.0000 mg | ORAL_CAPSULE | Freq: Four times a day (QID) | ORAL | Status: DC
  Administered 2024-01-16 – 2024-01-18 (×7): 800 mg via ORAL
  Filled 2024-01-16 (×7): qty 2

## 2024-01-16 MED ORDER — IOHEXOL 350 MG/ML SOLN
75.0000 mL | Freq: Once | INTRAVENOUS | Status: AC | PRN
  Administered 2024-01-16: 75 mL via INTRAVENOUS

## 2024-01-16 MED ORDER — ZOLPIDEM TARTRATE 5 MG PO TABS
5.0000 mg | ORAL_TABLET | Freq: Every day | ORAL | Status: DC
  Administered 2024-01-16 – 2024-01-17 (×2): 5 mg via ORAL
  Filled 2024-01-16 (×2): qty 1

## 2024-01-16 MED ORDER — SODIUM CHLORIDE 0.9 % IV SOLN
500.0000 mg | INTRAVENOUS | Status: DC
  Administered 2024-01-17: 500 mg via INTRAVENOUS
  Filled 2024-01-16: qty 5

## 2024-01-16 MED ORDER — POLYETHYLENE GLYCOL 3350 17 G PO PACK: 17.0000 g | PACK | Freq: Every day | ORAL | Status: DC | PRN

## 2024-01-16 MED ORDER — MIRTAZAPINE 15 MG PO TABS
30.0000 mg | ORAL_TABLET | Freq: Every day | ORAL | Status: DC
  Administered 2024-01-16 – 2024-01-17 (×2): 30 mg via ORAL
  Filled 2024-01-16 (×2): qty 2

## 2024-01-16 MED ORDER — ONDANSETRON HCL 4 MG/2ML IJ SOLN: 4.0000 mg | Freq: Four times a day (QID) | INTRAMUSCULAR | Status: DC | PRN

## 2024-01-16 MED ORDER — IPRATROPIUM-ALBUTEROL 0.5-2.5 (3) MG/3ML IN SOLN: 3.0000 mL | Freq: Four times a day (QID) | RESPIRATORY_TRACT | Status: DC | PRN

## 2024-01-16 MED ORDER — SODIUM CHLORIDE 0.9 % IV SOLN
500.0000 mg | Freq: Once | INTRAVENOUS | Status: AC
  Administered 2024-01-16: 500 mg via INTRAVENOUS
  Filled 2024-01-16: qty 5

## 2024-01-16 MED ORDER — APIXABAN 5 MG PO TABS
5.0000 mg | ORAL_TABLET | Freq: Two times a day (BID) | ORAL | Status: DC
  Administered 2024-01-16 – 2024-01-18 (×4): 5 mg via ORAL
  Filled 2024-01-16 (×4): qty 1

## 2024-01-16 MED ORDER — GUAIFENESIN-DM 100-10 MG/5ML PO SYRP
10.0000 mL | ORAL_SOLUTION | ORAL | Status: DC | PRN
  Administered 2024-01-16 – 2024-01-18 (×6): 10 mL via ORAL
  Filled 2024-01-16 (×6): qty 10

## 2024-01-16 MED ORDER — POTASSIUM CHLORIDE 20 MEQ PO PACK
40.0000 meq | PACK | Freq: Once | ORAL | Status: AC
  Administered 2024-01-16: 40 meq via ORAL
  Filled 2024-01-16: qty 2

## 2024-01-16 MED ORDER — OXYCODONE HCL 5 MG PO TABS
30.0000 mg | ORAL_TABLET | Freq: Four times a day (QID) | ORAL | Status: DC | PRN
  Administered 2024-01-16 – 2024-01-18 (×6): 30 mg via ORAL
  Filled 2024-01-16 (×6): qty 6

## 2024-01-16 MED ORDER — ACETAMINOPHEN 325 MG PO TABS: 650.0000 mg | ORAL_TABLET | Freq: Four times a day (QID) | ORAL | Status: DC | PRN

## 2024-01-16 MED ORDER — ONDANSETRON HCL 4 MG PO TABS: 4.0000 mg | ORAL_TABLET | Freq: Four times a day (QID) | ORAL | Status: DC | PRN

## 2024-01-16 NOTE — Consult Note (Signed)
 CODE SEPSIS - PHARMACY COMMUNICATION  **Broad Spectrum Antibiotics should be administered within 1 hour of Sepsis diagnosis**  Time Code Sepsis Called/Page Received: 1046  Antibiotics Ordered: azithromycin and ceftriaxone  Time of 1st antibiotic administration: 1120  Additional action taken by pharmacy: none  If necessary, Name of Provider/Nurse Contacted: n/a    Achilles Neville Rodriguez-Guzman PharmD, BCPS 01/16/2024 11:36 AM

## 2024-01-16 NOTE — ED Provider Notes (Addendum)
 [facility] Provider Note    Event Date/Time   First MD Initiated Contact with Patient 01/16/24 [phone]     (approximate)   History   Chest Pain and Shortness of Breath   HPI  Tracey Perez is a 58 y.o. female past medical history significant for hypertension, pulmonary embolism and DVT on Eliquis , who presents to the emergency department with chest pain and shortness of breath.  States that she has not been feeling well for the past 4 days.  Complaining of cough and shortness of breath.  Chest pain that is worse with deep inspiration.  Denies any blood in her sputum.  No fever.  Denies nausea, vomiting or diarrhea.  Denies any significant swelling to her legs but states that sometimes her left leg swells.  Denies any falls or trauma.  Endorses a history of tobacco use but no recent tobacco use no prior diagnosis of COPD.  Does not wear oxygen at home.  No recent travel.  Endorses compliance with her Eliquis .  COVID and influenza testing done on Thursday that were negative     Physical Exam   Triage Vital Signs: ED Triage Vitals  Encounter Vitals Group     BP 01/16/24 0925 134/80     Girls Systolic BP Percentile --      Girls Diastolic BP Percentile --      Boys Systolic BP Percentile --      Boys Diastolic BP Percentile --      Pulse Rate 01/16/24 0925 88     Resp 01/16/24 0925 20     Temp 01/16/24 0925 98.3 F (36.8 C)     Temp Source 01/16/24 0925 Oral     SpO2 01/16/24 0925 (!) 82 %     Weight 01/16/24 0926 124 lb (56.2 kg)     Height 01/16/24 0926 5' 3 (1.6 m)     Head Circumference --      Peak Flow --      Pain Score 01/16/24 0926 9     Pain Loc --      Pain Education --      Exclude from Growth Chart --     Most recent vital signs: Vitals:   01/16/24 0925 01/16/24 0942  BP: 134/80 138/71  Pulse: 88 85  Resp: 20   Temp: 98.3 F (36.8 C)   SpO2: (!) 82% 100%    Physical Exam Constitutional:      Appearance: She is  well-developed. She is ill-appearing.  HENT:     Head: Atraumatic.  Eyes:     Conjunctiva/sclera: Conjunctivae normal.  Cardiovascular:     Rate and Rhythm: Regular rhythm.  Pulmonary:     Effort: Accessory muscle usage and respiratory distress present.     Comments: 82% on room air, placed on 3 L nasal cannula with improvement to 100% Abdominal:     General: There is no distension.     Palpations: Abdomen is soft.     Tenderness: There is no abdominal tenderness.  Musculoskeletal:        General: Normal range of motion.     Cervical back: Normal range of motion.     Right lower leg: No edema.     Left lower leg: No edema.     Comments: No unilateral leg swelling  Skin:    General: Skin is warm.     Capillary Refill: Capillary refill takes less than 2 seconds.  Neurological:  General: No focal deficit present.     Mental Status: She is alert. Mental status is at baseline.     IMPRESSION / MDM / ASSESSMENT AND PLAN / ED COURSE  I reviewed the triage vital signs and the nursing notes.  Differential diagnosis including ACS, anemia, pulmonary embolism, pneumonia, viral illness including COVID/influenza, COPD \  On arrival afebrile, hemodynamically stable but hypoxic to 82% requiring 3L of oxygen  EKG  I, Clotilda Punter, the attending physician, personally viewed and interpreted this ECG.  Initial EKG with signs of a strain pattern.  This has been changed from her last EKG but does to look to be similar to an EKG done in 2020.  Repeat EKG done when she got in the room given her ongoing symptoms with no significant change compared to the initial.  Diffuse ST depression.  No significant ST elevation.  No signs of dysrhythmia.  No tachycardic or bradycardic dysrhythmias while on cardiac telemetry.  RADIOLOGY I independently reviewed imaging, my interpretation of imaging: Chest x-ray concerning for multifocal pneumonia  CTA concerning for multifocal pneumonia.  Read currently  pending  LABS (all labs ordered are listed, but only abnormal results are displayed) Labs interpreted as -    Labs Reviewed  BASIC METABOLIC PANEL WITH GFR - Abnormal; Notable for the following components:      Result Value   Potassium 2.4 (*)    Chloride 97 (*)    Glucose, Bld 115 (*)    Calcium 8.6 (*)    All other components within normal limits  CBC - Abnormal; Notable for the following components:   RBC 3.57 (*)    Hemoglobin 11.2 (*)    HCT 31.6 (*)    All other components within normal limits  HEPATIC FUNCTION PANEL - Abnormal; Notable for the following components:   Total Protein 6.2 (*)    Albumin 3.0 (*)    All other components within normal limits  PROTIME-INR - Abnormal; Notable for the following components:   Prothrombin Time 17.4 (*)    INR 1.4 (*)    All other components within normal limits  CULTURE, BLOOD (ROUTINE X 2)  CULTURE, BLOOD (ROUTINE X 2)  BRAIN NATRIURETIC PEPTIDE  MAGNESIUM  LACTIC ACID, PLASMA  LACTIC ACID, PLASMA  TROPONIN I (HIGH SENSITIVITY)     MDM  No significant leukocytosis.  Anemia with a hemoglobin of 11.2 which appears to be decreased when compared to her baseline of 12-14.  Normal platelets.  No signs or symptoms consistent with a GI bleed.  Potassium is low at 2.4.  Given p.o. replacement added on magnesium level.  Initial troponin is negative at 5, have low suspicion for ACS  CTA obtained and read currently pending.  Hospitalist will follow-up read.  On my evaluation no obvious pulmonary embolism but does have findings concerning for multifocal pneumonia.  Blood cultures obtained and ordered a lactic acid.  Started on antibiotics to cover for community-acquired pneumonia.  Consulted hospitalist for admission for acute hypoxic respiratory failure in the setting of multifocal pneumonia     PROCEDURES:  Critical Care performed: yes  .Critical Care  Performed by: Punter Clotilda, MD Authorized by: Punter Clotilda, MD    Critical care provider statement:    Critical care time (minutes):  30   Critical care time was exclusive of:  Separately billable procedures and treating other patients   Critical care was necessary to treat or prevent imminent or life-threatening deterioration of the following conditions:  Respiratory  failure   Critical care was time spent personally by me on the following activities:  Development of treatment plan with patient or surrogate, discussions with consultants, evaluation of patient's response to treatment, examination of patient, ordering and review of laboratory studies, ordering and review of radiographic studies, ordering and performing treatments and interventions, pulse oximetry, re-evaluation of patient's condition and review of old charts   Care discussed with: admitting provider     Patient's presentation is most consistent with acute presentation with potential threat to life or bodily function.   MEDICATIONS ORDERED IN ED: Medications  lactated ringers  infusion (has no administration in time range)  cefTRIAXone (ROCEPHIN) 2 g in sodium chloride  0.9 % 100 mL IVPB (has no administration in time range)  azithromycin (ZITHROMAX) 500 mg in sodium chloride  0.9 % 250 mL IVPB (has no administration in time range)  potassium chloride  (KLOR-CON ) packet 40 mEq (40 mEq Oral Given 01/16/24 1016)  iohexol  (OMNIPAQUE ) 350 MG/ML injection 75 mL (75 mLs Intravenous Contrast Given 01/16/24 1041)    FINAL CLINICAL IMPRESSION(S) / ED DIAGNOSES   Final diagnoses:  Chest pain, unspecified type  Shortness of breath  Hypoxia  Hypokalemia  Multifocal pneumonia     Rx / DC Orders   ED Discharge Orders     None        Note:  This document was prepared using Dragon voice recognition software and may include unintentional dictation errors.   Suzanne Kirsch, MD 01/16/24 1105    Suzanne Kirsch, MD 01/16/24 1105

## 2024-01-16 NOTE — ED Triage Notes (Signed)
 Pt c/o generalized chest pain, cough, and SOB x4 days.  Pain score 9/10.  Pt reports taking Mucinex w/o relief.  Hx of PEs and takes Eliquis .  Pt found to have O2 sat of 82%.

## 2024-01-16 NOTE — Sepsis Progress Note (Signed)
 Sepsis protocol is being followed by [facility].

## 2024-01-16 NOTE — H&P (Signed)
 History and Physical    Tracey Perez FMW:[id number] DOB: 03/28/1966 DOA: 01/16/2024  DOS: the patient was seen and examined on 01/16/2024  PCP: Valerio Melanie DASEN, NP   Patient coming from: Home  I have personally briefly reviewed patient's old medical records in [facility]  Chief Complaint: Chest pain and shortness of breath  HPI: Tracey Perez is a pleasant 58 y.o. female with medical history significant for HTN, PE/DVT on Eliquis , history of smoking who came into ED for evaluation of chest pain, cough and shortness of breath for 4 days.  Patient stated that her pain was retrosternal, sharp, 9/10, constant, nonradiating, not associated with nausea or vomiting.  She had cough which was dry not bringing up phlegm or sputum no blood.  Patient was taking Mucinex without any relief.  She was found to have oxygen saturation 82% on room air. Patient denies any fever, chills, leg swelling, hemoptysis, hematemesis, melena, diarrhea.  ED Course: Upon arrival to the ED, patient is found to have negative COVID and flu, chest x-ray showed multifocal pneumonia, hypoxemic around 82% on room air, requiring 3 L of nasal cannula to maintain saturation more than 90%, potassium was 2.4, CT chest PE studies negative for PE but showed bilateral ground glass appearance likely pneumonia.  Patient was given ceftriaxone azithromycin and cultures.  Hospitalist service was consulted for evaluation for admission for COPD exacerbation and cold acquired pneumonia.  Review of Systems:  ROS  All other systems negative except as noted in the HPI.  Past Medical History:  Diagnosis Date   Allergy    Anemia    Ferritin anemia    Anxiety    Arthritis    Complication of anesthesia    Depression    DVT (deep vein thrombosis) in pregnancy    GERD (gastroesophageal reflux disease)    Headache    chronic migraines    History of 2019 novel coronavirus disease (COVID-19) 10/12/2020   Repeat test on 10/15/2020  as patient did not believe result; result (+)   History of hiatal hernia    Hypertension    Hypothyroidism    PONV (postoperative nausea and vomiting)    Pulmonary embolism (HCC)    hx of last one 10/20 hospitalized for one day at [facility] per patient followed by Dr Melanee    Sleep apnea    does not uses cpap     Past Surgical History:  Procedure Laterality Date   ABDOMINAL HYSTERECTOMY     CESAREAN SECTION     COLONOSCOPY     ESOPHAGOGASTRODUODENOSCOPY (EGD) WITH PROPOFOL  N/A 09/04/2020   Procedure: ESOPHAGOGASTRODUODENOSCOPY (EGD) WITH PROPOFOL ;  Surgeon: Therisa Bi, MD;  Location: [facility] ENDOSCOPY;  Service: Gastroenterology;  Laterality: N/A;   INSERTION OF MESH  10/29/2020   Procedure: INSERTION OF MESH;  Surgeon: Jordis Laneta FALCON, MD;  Location: [facility] ORS;  Service: General;;   laparoscopic surgery      THYROID  LOBECTOMY Right 08/08/2019   Procedure: RIGHT THYROID  LOBECTOMY;  Surgeon: Eletha Boas, MD;  Location: [facility] ORS;  Service: General;  Laterality: Right;   TONSILLECTOMY     TOTAL ABDOMINAL HYSTERECTOMY W/ BILATERAL SALPINGOOPHORECTOMY     WISDOM TOOTH EXTRACTION     XI ROBOTIC ASSISTED HIATAL HERNIA REPAIR N/A 10/29/2020   Procedure: XI ROBOTIC ASSISTED HIATAL HERNIA REPAIR;  Surgeon: Jordis Laneta FALCON, MD;  Location: [facility] ORS;  Service: General;  Laterality: N/A;     reports that she quit smoking about 12 years ago. Her smoking  use included cigarettes. She started smoking about 37 years ago. She has a 25 pack-year smoking history. She has never been exposed to tobacco smoke. She has never used smokeless tobacco. She reports that she does not currently use alcohol. She reports that she does not use drugs.  Allergies  Allergen Reactions   Morphine And Codeine Hives   Nsaids Other (See Comments)    Affects platelet count   Sulfa Antibiotics Shortness Of Breath   Tape Other (See Comments)    bruises skin   Verapamil Swelling   Lisinopril  Other (See Comments)    Burning throat,  metallic taste   Other Itching    Pt states she is allergic to the plastic that IV bags are made of. Reaction causes itching relieved by Benadryl .    Acetazolamide Other (See Comments)    Acid reflux    Seroquel [Quetiapine Fumarate] Other (See Comments)    Muscle cramps    Family History  Problem Relation Age of Onset   Non-Hodgkin's lymphoma Mother    Hypertension Father    Depression Father    Alcohol abuse Father    Migraines Father    Multiple sclerosis Sister    Migraines Sister    Migraines Brother    Thyroid  disease Brother    Migraines Brother    Thyroid  disease Brother    Thyroid  disease Brother    Thyroid  disease Brother    Breast cancer Maternal Aunt 60    Prior to Admission medications   Medication Sig Start Date End Date Taking? Authorizing Provider  Blood Pressure Monitoring (BLOOD PRESSURE KIT) KIT 1 kit by Does not apply route 2 (two) times daily. 02/16/19   Iloabachie, Chioma E, NP  diphenhydrAMINE  (BENADRYL ) 25 mg capsule Take 50 mg by mouth 4 (four) times daily as needed for allergies or sleep.    [provider]  ELIQUIS  5 MG TABS tablet TAKE 1 TABLET(5 MG) BY MOUTH TWICE DAILY 01/01/24   Cannady, Jolene T, NP  gabapentin  (NEURONTIN ) 800 MG tablet Take 800 mg by mouth 4 (four) times daily. 08/11/21   [provider]  hydrALAZINE  (APRESOLINE ) 25 MG tablet TAKE 1 TABLET BY MOUTH DAILY AS NEEDED FOR BLOOD PRESSURE OVER 130/80 10/07/23   Cannady, Jolene T, NP  levothyroxine  (SYNTHROID ) 50 MCG tablet Take 1 tablet (50 mcg total) by mouth daily. 10/14/23   Cannady, Jolene T, NP  mirtazapine  (REMERON ) 30 MG tablet Take 30 mg by mouth at bedtime.    [provider]  ondansetron  (ZOFRAN -ODT) 4 MG disintegrating tablet Take 1 tablet (4 mg total) by mouth every 8 (eight) hours as needed for nausea or vomiting. 01/10/22   Lang Dover, MD  oxycodone  (ROXICODONE ) 30 MG immediate release tablet Take 1 tablet by mouth every 4 (four) hours as  needed. 12/23/21   [provider]  potassium chloride  SA (KLOR-CON  M) 20 MEQ tablet Take 1 tablet (20 mEq total) by mouth daily for 5 days. 10/14/23 10/19/23  Cannady, Jolene T, NP  tiZANidine  (ZANAFLEX ) 4 MG tablet SMARTSIG:3-4 Tablet(s) By Mouth Every Night 04/15/21   [provider]  venlafaxine  XR (EFFEXOR -XR) 150 MG 24 hr capsule Take 300 mg by mouth in the morning.    [provider]  zolpidem  (AMBIEN ) 10 MG tablet Take 10 mg by mouth at bedtime.    [provider]    Physical Exam: Vitals:   01/16/24 0942 01/16/24 1030 01/16/24 1100 01/16/24 1200  BP: 138/71 126/83 126/82   Pulse: 85  73 78 72  Resp:  18 19 13   Temp:      TempSrc:      SpO2: 100% 98% 94% 100%  Weight:      Height:        Physical Exam   Constitutional: Alert, awake, calm, comfortable HEENT: Neck supple Respiratory: Bilateral decreased air entry on both lung fields.-Scattered wheezing, no rales no rhonchi. Cardiovascular: Regular rate and rhythm, no murmurs / rubs / gallops. No extremity edema. 2+ pedal pulses. No carotid bruits.  Abdomen: Soft, no tenderness, Bowel sounds positive.  Musculoskeletal: no clubbing / cyanosis. Good ROM, no contractures. Normal muscle tone.  Skin: no rashes, lesions, ulcers. Neurologic: CN 2-12 grossly intact. Sensation intact, No focal deficit identified Psychiatric: Alert and oriented x 3. Normal mood.    Labs on Admission: I have personally reviewed following labs and imaging studies  CBC: Recent Labs  Lab 01/16/24 0930  WBC 7.7  HGB 11.2*  HCT 31.6*  MCV 88.5  PLT 198   Basic Metabolic Panel: Recent Labs  Lab 01/16/24 0930  NA 138  K 2.4*  CL 97*  CO2 30  GLUCOSE 115*  BUN 11  CREATININE 0.88  CALCIUM 8.6*  MG 1.8   GFR: Estimated Creatinine Clearance: 56.9 mL/min (by C-G formula based on SCr of 0.88 mg/dL). Liver Function Tests: Recent Labs  Lab 01/16/24 0959  AST 35  ALT 14  ALKPHOS 75  BILITOT 0.3  PROT 6.2*   ALBUMIN 3.0*   No results for input(s): LIPASE, AMYLASE in the last 168 hours. No results for input(s): AMMONIA in the last 168 hours. Coagulation Profile: Recent Labs  Lab 01/16/24 0959  INR 1.4*   Cardiac Enzymes: Recent Labs  Lab 01/16/24 0930  TROPONINIHS 5   BNP (last 3 results) Recent Labs    01/16/24 0959  BNP 41.5   HbA1C: No results for input(s): HGBA1C in the last 72 hours. CBG: No results for input(s): GLUCAP in the last 168 hours. Lipid Profile: No results for input(s): CHOL, HDL, LDLCALC, TRIG, CHOLHDL, LDLDIRECT in the last 72 hours. Thyroid  Function Tests: No results for input(s): TSH, T4TOTAL, FREET4, T3FREE, THYROIDAB in the last 72 hours. Anemia Panel: No results for input(s): VITAMINB12, FOLATE, FERRITIN, TIBC, IRON, RETICCTPCT in the last 72 hours. Urine analysis: No results found for: COLORURINE, APPEARANCEUR, LABSPEC, PHURINE, GLUCOSEU, HGBUR, BILIRUBINUR, KETONESUR, PROTEINUR, UROBILINOGEN, NITRITE, LEUKOCYTESUR  Radiological Exams on Admission: I have personally reviewed images CT Angio Chest Pulmonary Embolism (PE) W or WO Contrast Result Date: 01/16/2024 CLINICAL DATA:  Pulmonary embolism (PE) suspected, high prob EXAM: CT ANGIOGRAPHY CHEST WITH CONTRAST TECHNIQUE: Multidetector CT imaging of the chest was performed using the standard protocol during bolus administration of intravenous contrast. Multiplanar CT image reconstructions and MIPs were obtained to evaluate the vascular anatomy. RADIATION DOSE REDUCTION: This exam was performed according to the departmental dose-optimization program which includes automated exposure control, adjustment of the mA and/or kV according to patient size and/or use of iterative reconstruction technique. CONTRAST:  75mL OMNIPAQUE  IOHEXOL  350 MG/ML SOLN COMPARISON:  None Available. FINDINGS: Cardiovascular: No filling defects within the pulmonary  arteries to suggest acute pulmonary embolism. Mediastinum/Nodes: No axillary or supraclavicular adenopathy. No mediastinal or hilar adenopathy. No pericardial fluid. Esophagus normal. Lungs/Pleura: Patchy ground-glass opacities in the LEFT and RIGHT upper lobe. Ground-glass opacities in the lower lobes additionally to lesser extent. There is ground-glass densities in the upper lobes on CT from 2022. Upper Abdomen: Limited view of the liver, kidneys, pancreas  are unremarkable. Normal adrenal glands. Musculoskeletal: Insert No aggressive osseous lesion. Review of the MIP images confirms the above findings. IMPRESSION: 1. No evidence acute pulmonary embolism. 2. Bilateral ground-glass opacities. Findings suggest pulmonary edema versus atypical infection. Electronically Signed   By: Jackquline Boxer M.D.   On: 01/16/2024 11:26   DG Chest Portable 1 View Result Date: 01/16/2024 EXAM: 1 VIEW(S) XRAY OF THE CHEST 01/16/2024 09:49:02 AM COMPARISON: CT chest 04/30/2020. CLINICAL HISTORY: chest pain and SOB. Generalized chest pain, cough, and SOB x4 days. Pain score 9/10. Pt reports taking Mucinex w/o relief. Hx of PEs and takes Eliquis . Pt found to have O2 sat of 82%. FINDINGS: LUNGS AND PLEURA: Bilateral multifocal patchy opacities are identified within both lungs predominantly within the mid and lower lung zones. No pulmonary edema. No pleural effusion. No pneumothorax. HEART AND MEDIASTINUM: No acute abnormality of the cardiac and mediastinal silhouettes. BONES AND SOFT TISSUES: No acute osseous abnormality. IMPRESSION: 1. Bilateral multifocal patchy opacities within the mid and lower lungs, compatible with pneumonia. Recommend follow-up imaging to ensure resolution and to exclude underlying malignancy. . Electronically signed by: Waddell Calk MD 01/16/2024 10:34 AM EDT RP Workstation: [id number]    EKG: My personal interpretation of EKG shows: Sinus rhythm    Assessment/Plan Principal Problem:   CAP  (community acquired pneumonia) Active Problems:   History of pulmonary embolism   Chronic pain syndrome   History of DVT (deep vein thrombosis)   Essential hypertension   Mixed hyperlipidemia    Assessment and Plan: 58 year old female with multiple medical problems including but not limited to history of DVT/PE on Eliquis , HTN, HLD, chronic pain syndrome on high-dose oxycodone  at home presented to ED complaining of chest pain cough and shortness of breath.  1.:  Community acquired pneumonia - She will be admitted to hospital as inpatient - She will be given oxygen to maintain saturation more than 90% - She was started on ceftriaxone and azithromycin in the emergency room. - Will continue ceftriaxone and azithromycin - Blood cultures, urine Legionella and strep antigen  2.  COPD exacerbation requiring 3 L of oxygen - Her room air saturation was 82% - She will be given oxygen to maintain saturation more than 90% - DuoNebs, Solu-Medrol, antibiotics as mentioned above  3.  HLD - Resume home statin  4.  History of DVT/PE - Continue Eliquis  at home dose  5.  HTN - Continue monitor blood pressure  6.  Hypothyroidism - Continue levothyroxine  at home dose  7.  Chronic pain syndrome - Continue oxycodone  30 mg every 6 hours as needed - Continue gabapentin   8.  Hypokalemia - Replaced in the ED - Will continue to monitor and replace as needed    DVT prophylaxis: Eliquis  Code Status: DNR/DNI Family Communication: None Disposition Plan: Home Consults called: None Admission status: Inpatient, Telemetry bed   Nena Rebel, MD [facility] 01/16/2024, 12:54 PM

## 2024-01-16 NOTE — ED Notes (Addendum)
 Pt placed on 4L [place].  O2 sat increased to 93%.  Blue top sent to lab.

## 2024-01-17 ENCOUNTER — Other Ambulatory Visit: Payer: Self-pay

## 2024-01-17 DIAGNOSIS — J189 Pneumonia, unspecified organism: Secondary | ICD-10-CM | POA: Diagnosis not present

## 2024-01-17 LAB — RESPIRATORY PANEL BY PCR

## 2024-01-17 LAB — EXPECTORATED SPUTUM ASSESSMENT W GRAM STAIN, RFLX TO RESP C

## 2024-01-17 LAB — PROTIME-INR
INR: 1.2 (ref 0.8–1.2)
Prothrombin Time: 15.8 s — ABNORMAL HIGH (ref 11.4–15.2)

## 2024-01-17 LAB — CBC
HCT: 30.6 % — ABNORMAL LOW (ref 36.0–46.0)
Hemoglobin: 10.7 g/dL — ABNORMAL LOW (ref 12.0–15.0)
MCH: 31.3 pg (ref 26.0–34.0)
MCHC: 35 g/dL (ref 30.0–36.0)
MCV: 89.5 fL (ref 80.0–100.0)
Platelets: 195 K/uL (ref 150–400)
RBC: 3.42 MIL/uL — ABNORMAL LOW (ref 3.87–5.11)
RDW: 13 % (ref 11.5–15.5)
WBC: 5.5 K/uL (ref 4.0–10.5)
nRBC: 0 % (ref 0.0–0.2)

## 2024-01-17 LAB — BASIC METABOLIC PANEL WITH GFR
Anion gap: 9 (ref 5–15)
BUN: 7 mg/dL (ref 6–20)
CO2: 28 mmol/L (ref 22–32)
Calcium: 8.6 mg/dL — ABNORMAL LOW (ref 8.9–10.3)
Chloride: 104 mmol/L (ref 98–111)
Creatinine, Ser: 0.64 mg/dL (ref 0.44–1.00)
GFR, Estimated: >60 mL/min (ref >60)
Glucose, Bld: 98 mg/dL (ref 70–99)
Potassium: 3.2 mmol/L — ABNORMAL LOW (ref 3.5–5.1)
Sodium: 141 mmol/L (ref 135–145)

## 2024-01-17 LAB — STREP PNEUMONIAE URINARY ANTIGEN: Strep Pneumo Urinary Antigen: NEGATIVE

## 2024-01-17 LAB — MRSA NEXT GEN BY PCR, NASAL: MRSA by PCR Next Gen: NOT DETECTED

## 2024-01-17 LAB — CULTURE, BLOOD (ROUTINE X 2)

## 2024-01-17 MED ORDER — POTASSIUM CHLORIDE CRYS ER 20 MEQ PO TBCR
60.0000 meq | EXTENDED_RELEASE_TABLET | Freq: Once | ORAL | Status: AC
  Administered 2024-01-17: 60 meq via ORAL
  Filled 2024-01-17: qty 3

## 2024-01-17 MED ORDER — POTASSIUM CHLORIDE 20 MEQ PO PACK
60.0000 meq | PACK | Freq: Once | ORAL | Status: DC
  Filled 2024-01-17: qty 3

## 2024-01-17 MED ORDER — VENLAFAXINE HCL ER 150 MG PO CP24
300.0000 mg | ORAL_CAPSULE | Freq: Every morning | ORAL | Status: DC
  Administered 2024-01-17 – 2024-01-18 (×2): 300 mg via ORAL
  Filled 2024-01-17 (×2): qty 4
  Filled 2024-01-17 (×2): qty 2

## 2024-01-17 NOTE — Plan of Care (Signed)

## 2024-01-17 NOTE — Progress Notes (Signed)
 PROGRESS NOTE    Tracey Perez  FMW:[id number] DOB: [birth year] DOA: 01/16/2024 PCP: Valerio Melanie DASEN, NP     Brief Narrative:   From admission h and p  Tracey Perez is a pleasant 58 y.o. female with medical history significant for HTN, PE/DVT on Eliquis , history of smoking who came into ED for evaluation of chest pain, cough and shortness of breath for 4 days.  Patient stated that her pain was retrosternal, sharp, 9/10, constant, nonradiating, not associated with nausea or vomiting.  She had cough which was dry not bringing up phlegm or sputum no blood.  Patient was taking Mucinex without any relief.  She was found to have oxygen saturation 82% on room air. Patient denies any fever, chills, leg swelling, hemoptysis, hematemesis, melena, diarrhea.   Assessment & Plan:   Principal Problem:   CAP (community acquired pneumonia) Active Problems:   History of pulmonary embolism   Chronic pain syndrome   History of DVT (deep vein thrombosis)   Essential hypertension   Postsurgical hypothyroidism   Mixed hyperlipidemia  # CAP Several days cough, infiltrate on CT, no PE, no chf. Procal only mildly elevated, no leukocytosis or fever, not clear this is bacterial. Covid/flu/rsv neg. Not producing sputum - sputum for culture if able to produce - f/u urine antigens and 20-agent rvp - continue ceftriaxone/azithromycin  # Acute hypoxic respiratory failure 2/2 above process, 82 on arrival, currently normal on 2 liters - wean as able  # Hypokalemia Chronic, no vomiting/diarrhea. Doesn't appear to be on meds that would cause this. Mg wnl. With hx htn, hyperaldo? - renin/aldo - replete as needed  # Hx dvt/pe No PE on CTA - cont home apixaban   # Chronic pain - home venlafaxine , gabapentin , home oxy  # MDD - home mirtazapine    DVT prophylaxis: home apixaban  Code Status: full Family Communication: none at bedside  Level of care: Telemetry Medical Status is:  Inpatient    Consultants:  none  Procedures: none  Antimicrobials:  See above    Subjective: Reports cough/breathing stable  Objective: Vitals:   01/16/24 2028 01/17/24 0039 01/17/24 0524 01/17/24 0847  BP: 125/75 106/70 129/78 137/78  Pulse: 84 72 79 91  Resp: 18 16  19   Temp: 98.6 F (37 C) 98 F (36.7 C) 98.1 F (36.7 C) 98.8 F (37.1 C)  TempSrc:    Oral  SpO2: 91% 94% 94% 92%  Weight: 56.9 kg     Height: 5' 3 (1.6 m)       Intake/Output Summary (Last 24 hours) at 01/17/2024 1008 Last data filed at 01/17/2024 0600 Gross per 24 hour  Intake 2526.38 ml  Output --  Net 2526.38 ml   Filed Weights   01/16/24 0926 01/16/24 2028  Weight: 56.2 kg 56.9 kg    Examination:  General exam: Appears calm and comfortable  Respiratory system: scattered rhonchi, normal wob Cardiovascular system: S1 & S2 heard, RRR.   Gastrointestinal system: Abdomen is nondistended, soft and nontender.   Central nervous system: Alert and oriented. No focal neurological deficits. Extremities: Symmetric 5 x 5 power. Skin: No rashes, lesions or ulcers Psychiatry: Judgement and insight appear normal. Mood & affect appropriate.     Data Reviewed: I have personally reviewed following labs and imaging studies  CBC: Recent Labs  Lab 01/16/24 0930 01/17/24 0251  WBC 7.7 5.5  HGB 11.2* 10.7*  HCT 31.6* 30.6*  MCV 88.5 89.5  PLT 198 195   Basic Metabolic Panel: Recent Labs  Lab 01/16/24 0930 01/16/24 1608 01/17/24 0251  NA 138 136 141  K 2.4* 3.6 3.2*  CL 97* 98 104  CO2 30 29 28   GLUCOSE 115* 98 98  BUN 11 10 7   CREATININE 0.88 0.57 0.64  CALCIUM 8.6* 8.3* 8.6*  MG 1.8  --   --    GFR: Estimated Creatinine Clearance: 62.6 mL/min (by C-G formula based on SCr of 0.64 mg/dL). Liver Function Tests: Recent Labs  Lab 01/16/24 0959  AST 35  ALT 14  ALKPHOS 75  BILITOT 0.3  PROT 6.2*  ALBUMIN 3.0*   No results for input(s): LIPASE, AMYLASE in the last 168  hours. No results for input(s): AMMONIA in the last 168 hours. Coagulation Profile: Recent Labs  Lab 01/16/24 0959 01/17/24 0251  INR 1.4* 1.2   Cardiac Enzymes: No results for input(s): CKTOTAL, CKMB, CKMBINDEX, TROPONINI in the last 168 hours. BNP (last 3 results) No results for input(s): PROBNP in the last 8760 hours. HbA1C: No results for input(s): HGBA1C in the last 72 hours. CBG: No results for input(s): GLUCAP in the last 168 hours. Lipid Profile: No results for input(s): CHOL, HDL, LDLCALC, TRIG, CHOLHDL, LDLDIRECT in the last 72 hours. Thyroid  Function Tests: No results for input(s): TSH, T4TOTAL, FREET4, T3FREE, THYROIDAB in the last 72 hours. Anemia Panel: No results for input(s): VITAMINB12, FOLATE, FERRITIN, TIBC, IRON, RETICCTPCT in the last 72 hours. Urine analysis: No results found for: COLORURINE, APPEARANCEUR, LABSPEC, PHURINE, GLUCOSEU, HGBUR, BILIRUBINUR, KETONESUR, PROTEINUR, UROBILINOGEN, NITRITE, LEUKOCYTESUR Sepsis Labs: @LABRCNTIP (procalcitonin:4,lacticidven:4)  ) Recent Results (from the past 240 hours)  Resp panel by RT-PCR (RSV, Flu A&B, Covid) Anterior Nasal Swab     Status: None   Collection Time: 01/16/24 11:14 AM   Specimen: Anterior Nasal Swab  Result Value Ref Range Status   SARS Coronavirus 2 by RT PCR NEGATIVE NEGATIVE Final    Comment: (NOTE) SARS-CoV-2 target nucleic acids are NOT DETECTED.  The SARS-CoV-2 RNA is generally detectable in upper respiratory specimens during the acute phase of infection. The lowest concentration of SARS-CoV-2 viral copies this assay can detect is 138 copies/mL. A negative result does not preclude SARS-Cov-2 infection and should not be used as the sole basis for treatment or other patient management decisions. A negative result may occur with  improper specimen collection/handling, submission of specimen other than nasopharyngeal  swab, presence of viral mutation(s) within the areas targeted by this assay, and inadequate number of viral copies(<138 copies/mL). A negative result must be combined with clinical observations, patient history, and epidemiological information. The expected result is Negative.  Fact Sheet for Patients:  BloggerCourse.com  Fact Sheet for Healthcare Providers:  SeriousBroker.it  This test is no t yet approved or cleared by the [place]  FDA and  has been authorized for detection and/or diagnosis of SARS-CoV-2 by FDA under an Emergency Use Authorization (EUA). This EUA will remain  in effect (meaning this test can be used) for the duration of the COVID-19 declaration under Section 564(b)(1) of the Act, 21 U.S.C.section 360bbb-3(b)(1), unless the authorization is terminated  or revoked sooner.       Influenza A by PCR NEGATIVE NEGATIVE Final   Influenza B by PCR NEGATIVE NEGATIVE Final    Comment: (NOTE) The Xpert Xpress SARS-CoV-2/FLU/RSV plus assay is intended as an aid in the diagnosis of influenza from Nasopharyngeal swab specimens and should not be used as a sole basis for treatment. Nasal washings and aspirates are unacceptable for Xpert Xpress SARS-CoV-2/FLU/RSV testing.  Fact  Sheet for Patients: BloggerCourse.com  Fact Sheet for Healthcare Providers: SeriousBroker.it  This test is not yet approved or cleared by the [place]  FDA and has been authorized for detection and/or diagnosis of SARS-CoV-2 by FDA under an Emergency Use Authorization (EUA). This EUA will remain in effect (meaning this test can be used) for the duration of the COVID-19 declaration under Section 564(b)(1) of the Act, 21 U.S.C. section 360bbb-3(b)(1), unless the authorization is terminated or revoked.     Resp Syncytial Virus by PCR NEGATIVE NEGATIVE Final    Comment: (NOTE) Fact Sheet for  Patients: BloggerCourse.com  Fact Sheet for Healthcare Providers: SeriousBroker.it  This test is not yet approved or cleared by the [place]  FDA and has been authorized for detection and/or diagnosis of SARS-CoV-2 by FDA under an Emergency Use Authorization (EUA). This EUA will remain in effect (meaning this test can be used) for the duration of the COVID-19 declaration under Section 564(b)(1) of the Act, 21 U.S.C. section 360bbb-3(b)(1), unless the authorization is terminated or revoked.  Performed at [facility], [address] [place], [place] [address]   Blood culture (routine x 2)     Status: None (Preliminary result)   Collection Time: 01/16/24 11:15 AM   Specimen: BLOOD LEFT ARM  Result Value Ref Range Status   Specimen Description   Final    BLOOD LEFT ARM Performed at [facility], [address] [place], [place] [address]    Special Requests   Final    BOTTLES DRAWN AEROBIC AND ANAEROBIC Blood Culture results may not be optimal due to an inadequate volume of blood received in culture bottles Performed at [facility], [address] [place], [place] [address]    Culture  Setup Time PENDING  Incomplete   Culture   Final    NO GROWTH < 24 HOURS Performed at [facility], [address] [place], [place] [address]    Report Status PENDING  Incomplete  Blood culture (routine x 2)     Status: None (Preliminary result)   Collection Time: 01/16/24  9:19 PM   Specimen: BLOOD  Result Value Ref Range Status   Specimen Description BLOOD BLOOD LEFT HAND  Final   Special Requests   Final    BOTTLES DRAWN AEROBIC ONLY Blood Culture results may not be optimal due to an inadequate volume of blood received in culture bottles   Culture   Final    NO GROWTH < 12 HOURS Performed at [facility], [address] [place], [place] [address]    Report Status PENDING  Incomplete          Radiology Studies: CT Angio Chest Pulmonary Embolism (PE) W or WO Contrast Result Date: 01/16/2024 CLINICAL DATA:  Pulmonary embolism (PE) suspected, high prob EXAM: CT ANGIOGRAPHY CHEST WITH CONTRAST TECHNIQUE: Multidetector CT imaging of the chest was performed using the standard protocol during bolus administration of intravenous contrast. Multiplanar CT image reconstructions and MIPs were obtained to evaluate the vascular anatomy. RADIATION DOSE REDUCTION: This exam was performed according to the departmental dose-optimization program which includes automated exposure control, adjustment of the mA and/or kV according to patient size and/or use of iterative reconstruction technique. CONTRAST:  75mL OMNIPAQUE  IOHEXOL  350 MG/ML SOLN COMPARISON:  None Available. FINDINGS: Cardiovascular: No filling defects within the pulmonary arteries to suggest acute pulmonary embolism. Mediastinum/Nodes: No axillary or supraclavicular adenopathy. No mediastinal or hilar adenopathy. No pericardial fluid. Esophagus normal. Lungs/Pleura: Patchy ground-glass opacities in  the LEFT and RIGHT upper lobe. Ground-glass opacities in the lower lobes additionally to lesser extent. There is ground-glass densities in the upper lobes on CT from 2022. Upper Abdomen: Limited view of the liver, kidneys, pancreas are unremarkable. Normal adrenal glands. Musculoskeletal: Insert No aggressive osseous lesion. Review of the MIP images confirms the above findings. IMPRESSION: 1. No evidence acute pulmonary embolism. 2. Bilateral ground-glass opacities. Findings suggest pulmonary edema versus atypical infection. Electronically Signed   By: Jackquline Boxer M.D.   On: 01/16/2024 11:26   DG Chest Portable 1 View Result Date: 01/16/2024 EXAM: 1 VIEW(S) XRAY OF THE CHEST 01/16/2024 09:49:02 AM COMPARISON: CT chest 04/30/2020. CLINICAL HISTORY: chest pain and SOB. Generalized chest pain, cough, and SOB x4 days. Pain score 9/10. Pt reports  taking Mucinex w/o relief. Hx of PEs and takes Eliquis . Pt found to have O2 sat of 82%. FINDINGS: LUNGS AND PLEURA: Bilateral multifocal patchy opacities are identified within both lungs predominantly within the mid and lower lung zones. No pulmonary edema. No pleural effusion. No pneumothorax. HEART AND MEDIASTINUM: No acute abnormality of the cardiac and mediastinal silhouettes. BONES AND SOFT TISSUES: No acute osseous abnormality. IMPRESSION: 1. Bilateral multifocal patchy opacities within the mid and lower lungs, compatible with pneumonia. Recommend follow-up imaging to ensure resolution and to exclude underlying malignancy. . Electronically signed by: Waddell Calk MD 01/16/2024 10:34 AM EDT RP Workstation: [id number]        Scheduled Meds:  apixaban   5 mg Oral BID   gabapentin   800 mg Oral QID   levothyroxine   50 mcg Oral Daily   methylPREDNISolone (SOLU-MEDROL) injection  40 mg Intravenous Q12H   mirtazapine   30 mg Oral QHS   zolpidem   5 mg Oral QHS   Continuous Infusions:  azithromycin     cefTRIAXone (ROCEPHIN)  IV 2 g (01/17/24 0856)     LOS: 1 day     Devaughn KATHEE Ban, MD [facility]   If 7PM-7AM, please contact night-coverage [email or web address] Password [email or web address] 01/17/2024, 10:08 AM

## 2024-01-18 DIAGNOSIS — J189 Pneumonia, unspecified organism: Secondary | ICD-10-CM | POA: Diagnosis not present

## 2024-01-18 LAB — BASIC METABOLIC PANEL WITH GFR
Anion gap: 14 (ref 5–15)
BUN: <5 mg/dL — ABNORMAL LOW (ref 6–20)
CO2: 27 mmol/L (ref 22–32)
Calcium: 8.6 mg/dL — ABNORMAL LOW (ref 8.9–10.3)
Chloride: 101 mmol/L (ref 98–111)
Creatinine, Ser: 0.57 mg/dL (ref 0.44–1.00)
GFR, Estimated: >60 mL/min (ref >60)
Glucose, Bld: 121 mg/dL — ABNORMAL HIGH (ref 70–99)
Potassium: 3 mmol/L — ABNORMAL LOW (ref 3.5–5.1)
Sodium: 142 mmol/L (ref 135–145)

## 2024-01-18 MED ORDER — POTASSIUM CHLORIDE CRYS ER 20 MEQ PO TBCR
60.0000 meq | EXTENDED_RELEASE_TABLET | Freq: Once | ORAL | Status: AC
  Administered 2024-01-18: 60 meq via ORAL
  Filled 2024-01-18: qty 3

## 2024-01-18 MED ORDER — AMOXICILLIN 500 MG PO CAPS
1000.0000 mg | ORAL_CAPSULE | Freq: Three times a day (TID) | ORAL | 0 refills | Status: AC
Start: 2024-01-18 — End: 2024-01-23

## 2024-01-18 NOTE — Discharge Summary (Signed)
 Tracey Perez:[id number] DOB: Feb 05, 1966 DOA: 01/16/2024  PCP: Valerio Melanie DASEN, NP  Admit date: 01/16/2024 Discharge date: 01/18/2024  Time spent: 35 minutes  Recommendations for Outpatient Follow-up:  Pcp f/u 1 week  F/u hyperaldosteronism labs (pending at time of d/c)    Discharge Diagnoses:  Principal Problem:   CAP (community acquired pneumonia) Active Problems:   History of pulmonary embolism   Chronic pain syndrome   History of DVT (deep vein thrombosis)   Essential hypertension   Postsurgical hypothyroidism   Mixed hyperlipidemia   Discharge Condition: improving  Diet recommendation: regular  Filed Weights   01/16/24 0926 01/16/24 2028  Weight: 56.2 kg 56.9 kg    History of present illness:  From admission h and p Tracey Perez is a pleasant 58 y.o. female with medical history significant for HTN, PE/DVT on Eliquis , history of smoking who came into ED for evaluation of chest pain, cough and shortness of breath for 4 days.  Patient stated that her pain was retrosternal, sharp, 9/10, constant, nonradiating, not associated with nausea or vomiting.  She had cough which was dry not bringing up phlegm or sputum no blood.  Patient was taking Mucinex without any relief.  She was found to have oxygen saturation 82% on room air. Patient denies any fever, chills, leg swelling, hemoptysis, hematemesis, melena, diarrhea.  Hospital Course:   # CAP # Rhinovirus Several days cough, infiltrate on CT, no PE, no chf. Procal only mildly elevated, no leukocytosis or fever, not clear this is bacterial. Covid/flu/rsv neg, positive for rhinovirus - improving and given above think most likely this is a viral pneumonia, but given prevalence of rhinovirus we can't always assume that a positive result means it's the cause, so shared decision to prescribe amoxicillin but patient will fill only if she fails to continue to improve   # Acute hypoxic respiratory failure 2/2 above  process, 82 on arrival, now weaned to room air and ambulated without desaturation  # Hypokalemia Chronic, no vomiting/diarrhea. Doesn't appear to be on meds that would cause this. Mg wnl. With hx htn, hyperaldo? - renin/aldo pending   # Hx dvt/pe No PE on CTA - cont home apixaban    # Chronic pain - home venlafaxine , gabapentin , home oxy   # MDD - home mirtazapine   Procedures: none   Consultations: none  Discharge Exam: Vitals:   01/18/24 1012 01/18/24 1128  BP:  131/86  Pulse:  90  Resp:  (!) 22  Temp:  98.4 F (36.9 C)  SpO2: 95% 95%    General: NAD Cardiovascular: RRR Respiratory: scattered rhonchi, normal wob  Discharge Instructions   Discharge Instructions     Diet - low sodium heart healthy   Complete by: As directed    Increase activity slowly   Complete by: As directed       Allergies as of 01/18/2024       Reactions   Morphine And Codeine Hives   Nsaids Other (See Comments)   Affects platelet count   Sulfa Antibiotics Shortness Of Breath   Tape Other (See Comments)   bruises skin   Verapamil Swelling   Lisinopril  Other (See Comments)   Burning throat, metallic taste   Other Itching   Pt states she is allergic to the plastic that IV bags are made of. Reaction causes itching relieved by Benadryl .    Acetazolamide Other (See Comments)   Acid reflux   Seroquel [quetiapine Fumarate] Other (See Comments)   Muscle cramps  Medication List     STOP taking these medications    ondansetron  4 MG disintegrating tablet Commonly known as: ZOFRAN -ODT   potassium chloride  SA 20 MEQ tablet Commonly known as: KLOR-CON  M   tiZANidine  4 MG tablet Commonly known as: ZANAFLEX        TAKE these medications    amoxicillin 500 MG capsule Commonly known as: AMOXIL Take 2 capsules (1,000 mg total) by mouth 3 (three) times daily for 5 days.   Blood Pressure Kit Kit 1 kit by Does not apply route 2 (two) times daily.   diphenhydrAMINE  25  mg capsule Commonly known as: BENADRYL  Take 50 mg by mouth 4 (four) times daily as needed for allergies or sleep.   Eliquis  5 MG Tabs tablet Generic drug: apixaban  TAKE 1 TABLET(5 MG) BY MOUTH TWICE DAILY   gabapentin  800 MG tablet Commonly known as: NEURONTIN  Take 800 mg by mouth 4 (four) times daily.   hydrALAZINE  25 MG tablet Commonly known as: APRESOLINE  TAKE 1 TABLET BY MOUTH DAILY AS NEEDED FOR BLOOD PRESSURE OVER 130/80   levothyroxine  50 MCG tablet Commonly known as: SYNTHROID  Take 1 tablet (50 mcg total) by mouth daily.   mirtazapine  30 MG tablet Commonly known as: REMERON  Take 30 mg by mouth at bedtime.   oxycodone  30 MG immediate release tablet Commonly known as: ROXICODONE  Take 1 tablet by mouth every 4 (four) hours as needed.   venlafaxine  XR 150 MG 24 hr capsule Commonly known as: EFFEXOR -XR Take 300 mg by mouth in the morning.   zolpidem  10 MG tablet Commonly known as: AMBIEN  Take 10 mg by mouth at bedtime.       Allergies  Allergen Reactions   Morphine And Codeine Hives   Nsaids Other (See Comments)    Affects platelet count   Sulfa Antibiotics Shortness Of Breath   Tape Other (See Comments)    bruises skin   Verapamil Swelling   Lisinopril  Other (See Comments)    Burning throat, metallic taste   Other Itching    Pt states she is allergic to the plastic that IV bags are made of. Reaction causes itching relieved by Benadryl .    Acetazolamide Other (See Comments)    Acid reflux    Seroquel [Quetiapine Fumarate] Other (See Comments)    Muscle cramps    Follow-up Information     Valerio Melanie DASEN, NP Follow up.   Specialty: Nurse Practitioner Why: hospital follow up Contact information: [address] [place] [address] [phone]                  The results of significant diagnostics from this hospitalization (including imaging, microbiology, ancillary and laboratory) are listed below for reference.    Significant  Diagnostic Studies: CT Angio Chest Pulmonary Embolism (PE) W or WO Contrast Result Date: 01/16/2024 CLINICAL DATA:  Pulmonary embolism (PE) suspected, high prob EXAM: CT ANGIOGRAPHY CHEST WITH CONTRAST TECHNIQUE: Multidetector CT imaging of the chest was performed using the standard protocol during bolus administration of intravenous contrast. Multiplanar CT image reconstructions and MIPs were obtained to evaluate the vascular anatomy. RADIATION DOSE REDUCTION: This exam was performed according to the departmental dose-optimization program which includes automated exposure control, adjustment of the mA and/or kV according to patient size and/or use of iterative reconstruction technique. CONTRAST:  75mL OMNIPAQUE  IOHEXOL  350 MG/ML SOLN COMPARISON:  None Available. FINDINGS: Cardiovascular: No filling defects within the pulmonary arteries to suggest acute pulmonary embolism. Mediastinum/Nodes: No axillary or supraclavicular adenopathy. No  mediastinal or hilar adenopathy. No pericardial fluid. Esophagus normal. Lungs/Pleura: Patchy ground-glass opacities in the LEFT and RIGHT upper lobe. Ground-glass opacities in the lower lobes additionally to lesser extent. There is ground-glass densities in the upper lobes on CT from 2022. Upper Abdomen: Limited view of the liver, kidneys, pancreas are unremarkable. Normal adrenal glands. Musculoskeletal: Insert No aggressive osseous lesion. Review of the MIP images confirms the above findings. IMPRESSION: 1. No evidence acute pulmonary embolism. 2. Bilateral ground-glass opacities. Findings suggest pulmonary edema versus atypical infection. Electronically Signed   By: Jackquline Boxer M.D.   On: 01/16/2024 11:26   DG Chest Portable 1 View Result Date: 01/16/2024 EXAM: 1 VIEW(S) XRAY OF THE CHEST 01/16/2024 09:49:02 AM COMPARISON: CT chest 04/30/2020. CLINICAL HISTORY: chest pain and SOB. Generalized chest pain, cough, and SOB x4 days. Pain score 9/10. Pt reports taking Mucinex  w/o relief. Hx of PEs and takes Eliquis . Pt found to have O2 sat of 82%. FINDINGS: LUNGS AND PLEURA: Bilateral multifocal patchy opacities are identified within both lungs predominantly within the mid and lower lung zones. No pulmonary edema. No pleural effusion. No pneumothorax. HEART AND MEDIASTINUM: No acute abnormality of the cardiac and mediastinal silhouettes. BONES AND SOFT TISSUES: No acute osseous abnormality. IMPRESSION: 1. Bilateral multifocal patchy opacities within the mid and lower lungs, compatible with pneumonia. Recommend follow-up imaging to ensure resolution and to exclude underlying malignancy. . Electronically signed by: Waddell Calk MD 01/16/2024 10:34 AM EDT RP Workstation: [id number]    Microbiology: Recent Results (from the past 240 hours)  Resp panel by RT-PCR (RSV, Flu A&B, Covid) Anterior Nasal Swab     Status: None   Collection Time: 01/16/24 11:14 AM   Specimen: Anterior Nasal Swab  Result Value Ref Range Status   SARS Coronavirus 2 by RT PCR NEGATIVE NEGATIVE Final    Comment: (NOTE) SARS-CoV-2 target nucleic acids are NOT DETECTED.  The SARS-CoV-2 RNA is generally detectable in upper respiratory specimens during the acute phase of infection. The lowest concentration of SARS-CoV-2 viral copies this assay can detect is 138 copies/mL. A negative result does not preclude SARS-Cov-2 infection and should not be used as the sole basis for treatment or other patient management decisions. A negative result may occur with  improper specimen collection/handling, submission of specimen other than nasopharyngeal swab, presence of viral mutation(s) within the areas targeted by this assay, and inadequate number of viral copies(<138 copies/mL). A negative result must be combined with clinical observations, patient history, and epidemiological information. The expected result is Negative.  Fact Sheet for Patients:  BloggerCourse.com  Fact Sheet for  Healthcare Providers:  SeriousBroker.it  This test is no t yet approved or cleared by the [place]  FDA and  has been authorized for detection and/or diagnosis of SARS-CoV-2 by FDA under an Emergency Use Authorization (EUA). This EUA will remain  in effect (meaning this test can be used) for the duration of the COVID-19 declaration under Section 564(b)(1) of the Act, 21 U.S.C.section 360bbb-3(b)(1), unless the authorization is terminated  or revoked sooner.       Influenza A by PCR NEGATIVE NEGATIVE Final   Influenza B by PCR NEGATIVE NEGATIVE Final    Comment: (NOTE) The Xpert Xpress SARS-CoV-2/FLU/RSV plus assay is intended as an aid in the diagnosis of influenza from Nasopharyngeal swab specimens and should not be used as a sole basis for treatment. Nasal washings and aspirates are unacceptable for Xpert Xpress SARS-CoV-2/FLU/RSV testing.  Fact Sheet for Patients: BloggerCourse.com  Fact Sheet  for Healthcare Providers: SeriousBroker.it  This test is not yet approved or cleared by the [place]  FDA and has been authorized for detection and/or diagnosis of SARS-CoV-2 by FDA under an Emergency Use Authorization (EUA). This EUA will remain in effect (meaning this test can be used) for the duration of the COVID-19 declaration under Section 564(b)(1) of the Act, 21 U.S.C. section 360bbb-3(b)(1), unless the authorization is terminated or revoked.     Resp Syncytial Virus by PCR NEGATIVE NEGATIVE Final    Comment: (NOTE) Fact Sheet for Patients: BloggerCourse.com  Fact Sheet for Healthcare Providers: SeriousBroker.it  This test is not yet approved or cleared by the [place]  FDA and has been authorized for detection and/or diagnosis of SARS-CoV-2 by FDA under an Emergency Use Authorization (EUA). This EUA will remain in effect (meaning this  test can be used) for the duration of the COVID-19 declaration under Section 564(b)(1) of the Act, 21 U.S.C. section 360bbb-3(b)(1), unless the authorization is terminated or revoked.  Performed at [facility], [address] [place], [place] [address]   Blood culture (routine x 2)     Status: None (Preliminary result)   Collection Time: 01/16/24 11:15 AM   Specimen: BLOOD LEFT ARM  Result Value Ref Range Status   Specimen Description BLOOD LEFT ARM  Final   Special Requests   Final    BOTTLES DRAWN AEROBIC AND ANAEROBIC Blood Culture results may not be optimal due to an inadequate volume of blood received in culture bottles   Culture   Final    NO GROWTH 2 DAYS Performed at [facility], [address] [place], [place] [address]    Report Status PENDING  Incomplete  MRSA Next Gen by PCR, Nasal     Status: None   Collection Time: 01/16/24 11:49 AM   Specimen: Nasal Mucosa; Nasal Swab  Result Value Ref Range Status   MRSA by PCR Next Gen NOT DETECTED NOT DETECTED Final    Comment: (NOTE) The GeneXpert MRSA Assay (FDA approved for NASAL specimens only), is one component of a comprehensive MRSA colonization surveillance program. It is not intended to diagnose MRSA infection nor to guide or monitor treatment for MRSA infections. Test performance is not FDA approved in patients less than 27 years old. Performed at [facility], [address] [place], [place] [address]   Blood culture (routine x 2)     Status: None (Preliminary result)   Collection Time: 01/16/24  9:19 PM   Specimen: BLOOD  Result Value Ref Range Status   Specimen Description BLOOD BLOOD LEFT HAND  Final   Special Requests   Final    BOTTLES DRAWN AEROBIC ONLY Blood Culture results may not be optimal due to an inadequate volume of blood received in culture bottles   Culture   Final    NO GROWTH 2 DAYS Performed at [facility], [address], [place], [place]  [address]    Report Status PENDING  Incomplete  Respiratory (~20 pathogens) panel by PCR     Status: Abnormal   Collection Time: 01/17/24 11:49 AM   Specimen: Nasopharyngeal Swab; Respiratory  Result Value Ref Range Status   Adenovirus NOT DETECTED NOT DETECTED Final   Coronavirus 229E NOT DETECTED NOT DETECTED Final    Comment: (NOTE) The Coronavirus on the Respiratory Panel, DOES NOT test for the novel  Coronavirus (2019 nCoV)    Coronavirus HKU1 NOT DETECTED NOT DETECTED Final   Coronavirus NL63 NOT DETECTED NOT  DETECTED Final   Coronavirus OC43 NOT DETECTED NOT DETECTED Final   Metapneumovirus NOT DETECTED NOT DETECTED Final   Rhinovirus / Enterovirus DETECTED (A) NOT DETECTED Final   Influenza A NOT DETECTED NOT DETECTED Final   Influenza B NOT DETECTED NOT DETECTED Final   Parainfluenza Virus 1 NOT DETECTED NOT DETECTED Final   Parainfluenza Virus 2 NOT DETECTED NOT DETECTED Final   Parainfluenza Virus 3 NOT DETECTED NOT DETECTED Final   Parainfluenza Virus 4 NOT DETECTED NOT DETECTED Final   Respiratory Syncytial Virus NOT DETECTED NOT DETECTED Final   Bordetella pertussis NOT DETECTED NOT DETECTED Final   Bordetella Parapertussis NOT DETECTED NOT DETECTED Final   Chlamydophila pneumoniae NOT DETECTED NOT DETECTED Final   Mycoplasma pneumoniae NOT DETECTED NOT DETECTED Final    Comment: Performed at [facility], [address], [place], [place] [address]  Expectorated Sputum Assessment w Gram Stain, Rflx to Resp Cult     Status: None   Collection Time: 01/17/24  3:12 PM   Specimen: Sputum  Result Value Ref Range Status   Specimen Description SPUTUM  Final   Special Requests NONE  Final   Sputum evaluation   Final    THIS SPECIMEN IS ACCEPTABLE FOR SPUTUM CULTURE Performed at [facility], [address], [place], [place] [address]    Report Status 01/17/2024 FINAL  Final  Culture, Respiratory w Gram Stain     Status: None (Preliminary result)   Collection  Time: 01/17/24  3:12 PM   Specimen: SPU  Result Value Ref Range Status   Specimen Description   Final    SPUTUM Performed at [facility], [address] [place], [place] [address]    Special Requests   Final    NONE Reflexed from [id number] Performed at [facility], [address] [place], [place] [address]    Gram Stain   Final    NO WBC SEEN NO ORGANISMS SEEN Performed at [facility], [address] [place], [place] [address]    Culture PENDING  Incomplete   Report Status PENDING  Incomplete     Labs: Basic Metabolic Panel: Recent Labs  Lab 01/16/24 0930 01/16/24 1608 01/17/24 0251 01/18/24 0903  NA 138 136 141 142  K 2.4* 3.6 3.2* 3.0*  CL 97* 98 104 101  CO2 30 29 28 27   GLUCOSE 115* 98 98 121*  BUN 11 10 7  <5*  CREATININE 0.88 0.57 0.64 0.57  CALCIUM 8.6* 8.3* 8.6* 8.6*  MG 1.8  --   --   --    Liver Function Tests: Recent Labs  Lab 01/16/24 0959  AST 35  ALT 14  ALKPHOS 75  BILITOT 0.3  PROT 6.2*  ALBUMIN 3.0*   No results for input(s): LIPASE, AMYLASE in the last 168 hours. No results for input(s): AMMONIA in the last 168 hours. CBC: Recent Labs  Lab 01/16/24 0930 01/17/24 0251  WBC 7.7 5.5  HGB 11.2* 10.7*  HCT 31.6* 30.6*  MCV 88.5 89.5  PLT 198 195   Cardiac Enzymes: No results for input(s): CKTOTAL, CKMB, CKMBINDEX, TROPONINI in the last 168 hours. BNP: BNP (last 3 results) Recent Labs    01/16/24 0959  BNP 41.5    ProBNP (last 3 results) No results for input(s): PROBNP in the last 8760 hours.  CBG: No results for input(s): GLUCAP in the last 168 hours.     Signed:  Devaughn KATHEE Ban MD.  [facility] 01/18/2024,  12:18 PM

## 2024-01-18 NOTE — Progress Notes (Signed)
 SATURATION QUALIFICATIONS: (This note is used to comply with regulatory documentation for home oxygen)  Patient Saturations on Room Air at Rest = 98 %  Patient Saturations on Room Air while Ambulating = 95%  Patient Saturations on n/a Liters of oxygen while Ambulating = n/a%  Please briefly explain why patient needs home oxygen: no need

## 2024-01-18 NOTE — TOC CM/SW Note (Signed)
 Transition of Care [facility]) - Inpatient Brief Assessment   Patient Details  Name: Tracey Perez MRN: [id number] Date of Birth: 1966/04/16  Transition of Care [facility]) CM/SW Contact:    Lauraine JAYSON Carpen, LCSW Phone Number: 01/18/2024, 12:21 PM   Clinical Narrative: Patient has orders to discharge home today. CSW reviewed chart. No TOC needs identified. CSW signing off.  Transition of Care Asessment: Insurance and Status: Insurance coverage has been reviewed Patient has primary care physician: Yes Home environment has been reviewed: Single family home Prior level of function:: Not documented Prior/Current Home Services: No current home services Social Drivers of Health Review: SDOH reviewed no interventions necessary Readmission risk has been reviewed: Yes Transition of care needs: no transition of care needs at this time

## 2024-01-19 ENCOUNTER — Telehealth: Payer: Self-pay | Admitting: Nurse Practitioner

## 2024-01-19 LAB — CULTURE, RESPIRATORY W GRAM STAIN
Culture: NORMAL
Gram Stain: NONE SEEN

## 2024-01-19 NOTE — Telephone Encounter (Signed)
 Copied from CRM #[id number]. Topic: Clinical - Red Word Triage >> Jan 19, 2024  1:06 PM Donna BRAVO wrote: Red Word that prompted transfer to Nurse Triage: Patient mentioned Red word symptoms, still having breathing problems  patient calling to schedule hospital follow up appt, Discharged 01/18/24 pneumonia. Patient is still having problems breathing, and  refusing to speaking with nurse triage     ----------------------------------------------------------------------- From previous Reason for Contact - Scheduling Inquiry for Clinic: Reason for CRM:

## 2024-01-20 LAB — LEGIONELLA PNEUMOPHILA SEROGP 1 UR AG: L. pneumophila Serogp 1 Ur Ag: NEGATIVE

## 2024-01-21 ENCOUNTER — Telehealth: Payer: Self-pay | Admitting: Nurse Practitioner

## 2024-01-21 LAB — CULTURE, BLOOD (ROUTINE X 2)
Culture  Setup Time: NO GROWTH
Culture: NO GROWTH
Report Status: NO GROWTH

## 2024-01-21 LAB — ALDOSTERONE + RENIN ACTIVITY W/ RATIO
ALDO / PRA Ratio: UNDETERMINED
Aldosterone: <1 ng/dL (ref 0.0–30.0)
PRA LC/MS/MS: <0.167 ng/mL/h — ABNORMAL LOW (ref 0.167–5.380)

## 2024-01-21 NOTE — Telephone Encounter (Signed)
 Copied from CRM [id number]. Topic: General - Other >> Jan 21, 2024 12:22 PM Emylou G wrote: Reason for CRM: Patient is looking for Doctors note.. work won't let her back til next week.. Can we do doctors note from 10/2-Monday when she comes in for the visit

## 2024-01-23 NOTE — Patient Instructions (Incomplete)

## 2024-01-25 ENCOUNTER — Inpatient Hospital Stay: Admitting: Nurse Practitioner

## 2024-01-25 DIAGNOSIS — E876 Hypokalemia: Secondary | ICD-10-CM

## 2024-01-25 DIAGNOSIS — J189 Pneumonia, unspecified organism: Secondary | ICD-10-CM

## 2024-01-25 NOTE — Telephone Encounter (Signed)
 Ok for E2C2 to review.  Please review below information from provider.

## 2024-01-25 NOTE — Telephone Encounter (Signed)
 Copied from CRM #[id number]. Topic: General - Other >> Jan 25, 2024 10:13 AM Leonette SQUIBB wrote: Reason for CRM: Pt called asking if she can get a return to work note.  [phone]

## 2024-01-26 ENCOUNTER — Encounter: Payer: Self-pay | Admitting: Nurse Practitioner

## 2024-01-27 ENCOUNTER — Encounter: Payer: Self-pay | Admitting: Nurse Practitioner

## 2024-01-27 ENCOUNTER — Inpatient Hospital Stay: Admitting: Nurse Practitioner

## 2024-01-27 MED ORDER — BENZONATATE 100 MG PO CAPS: 100.0000 mg | ORAL_CAPSULE | Freq: Three times a day (TID) | ORAL | 0 refills | Status: DC | PRN

## 2024-01-28 ENCOUNTER — Encounter: Payer: Self-pay | Admitting: Nurse Practitioner

## 2024-01-28 MED ORDER — NYSTATIN 100000 UNIT/ML MT SUSP: 5.0000 mL | Freq: Four times a day (QID) | OROMUCOSAL | 0 refills | Status: DC

## 2024-01-30 NOTE — Patient Instructions (Signed)
 Be Involved in Caring For Your Health:  Taking Medications When medications are taken as directed, they can greatly improve your health. But if they are not taken as prescribed, they may not work. In some cases, not taking them correctly can be harmful. To help ensure your treatment remains effective and safe, understand your medications and how to take them. Bring your medications to each visit for review by your provider.  Your lab results, notes, and after visit summary will be available on My Chart. We strongly encourage you to use this feature. If lab results are abnormal the clinic will contact you with the appropriate steps. If the clinic does not contact you assume the results are satisfactory. You can always view your results on My Chart. If you have questions regarding your health or results, please contact the clinic during office hours. You can also ask questions on My Chart.  We at [facility] are grateful that you chose [place] to provide your care. We strive to provide evidence-based and compassionate care and are always looking for feedback. If you get a survey from the clinic please complete this so we can hear your opinions.  Community-Acquired Pneumonia, Adult Pneumonia is an infection of the lungs. It causes irritation and swelling in the airways of the lungs. Mucus and fluid may also build up inside the airways. This may cause coughing and trouble breathing. One type of pneumonia can happen while you are in a hospital. A different type can happen when you are not in a hospital (community-acquired pneumonia). What are the causes?  This condition is caused by germs (viruses, bacteria, or fungi). Some types of germs can spread from person to person. Pneumonia is not thought to spread from person to person. What increases the risk? You have a long-term (chronic) disease, such as: Disease of the lungs. This may be chronic obstructive pulmonary disease (COPD) or asthma. Heart  failure. Cystic fibrosis. Diabetes. Kidney disease. Sickle cell disease. HIV. You have other health problems, such as: Your body's defense system (immune system) is weak. A condition that may cause you to breathe in fluids from your mouth and nose. You had your spleen taken out. You do not take good care of your teeth and mouth (poor dental hygiene). You use or have used tobacco products. You go where the germs that cause this illness are common. You are older than 59 years of age. What are the signs or symptoms? A cough. A fever. Sweating or chills. Chest pain, often when you breathe deeply or cough. Breathing problems, such as: Fast breathing. Trouble breathing. Shortness of breath. Feeling tired (fatigued). Muscle aches. How is this treated? Treatment for this condition depends on many things, such as: The cause of your illness. Your medicines. Your other health problems. Most adults can be treated at home. Sometimes, treatment must happen in a hospital. Treatment may include medicines to kill germs. Medicines may depend on which germ caused your illness. Very bad pneumonia is rare. If you get it, you may: Have a machine to help you breathe. Have fluid taken away from around your lungs. Follow these instructions at home: Medicines Take over-the-counter and prescription medicines only as told by your doctor. Take cough medicine only if you are losing sleep. Cough medicine can keep your body from taking mucus away from your lungs. If you were prescribed antibiotics, take them as told by your doctor. Do not stop taking them even if you start to feel better. Lifestyle  Do not smoke or use any products that contain nicotine or tobacco. If you need help quitting, ask your doctor. Do not drink alcohol. Eat a healthy diet. This includes a lot of vegetables, fruits, whole grains, low-fat dairy products, and low-fat (lean) protein. General instructions  Rest a lot. Sleep  for at least 8 hours each night. Sleep with your head and neck raised. Put a few pillows under your head or sleep in a reclining chair. Return to your normal activities as told by your doctor. Ask your doctor what activities are safe for you. Drink enough fluid to keep your pee (urine) pale yellow. If your throat is sore, gargle with a mixture of salt and water 3-4 times a day or as needed. To make salt water, completely dissolve -1 tsp (3-6 g) of salt in 1 cup (237 mL) of warm water. Keep all follow-up visits. How is this prevented? Getting the pneumonia shot (vaccine). These shots have different types and schedules. Ask your doctor what works best for you. Think about getting this shot if: You are older than 59 years of age. You are 25-63 years of age and: You are being treated for cancer. You have long-term lung disease. You have other problems that affect your body's defense system. Ask your doctor if you have one of these. Getting your flu shot every year. Ask your doctor which type of shot is best for you. Going to the dentist as often as told. Washing your hands often with soap and water for at least 20 seconds. If you cannot use soap and water, use hand sanitizer. Contact a doctor if: You have a fever. You lose sleep because your cough medicine does not help. Get help right away if: You are short of breath and this gets worse. You have more chest pain. Your sickness gets worse. This is very serious if: You are an older adult. Your body's defense system is weak. You cough up blood. These symptoms may be an emergency. Get help right away. Call 911. Do not wait to see if the symptoms will go away. Do not drive yourself to the hospital. Summary Pneumonia is an infection of the lungs. Community-acquired pneumonia affects people who have not been in the hospital. Certain germs can cause this infection. This condition may be treated with medicines that kill germs. For very bad  pneumonia, you may need a hospital stay and treatment to help with breathing. This information is not intended to replace advice given to you by your health care provider. Make sure you discuss any questions you have with your health care provider. Document Revised: 06/05/2021 Document Reviewed: 06/05/2021 [employer]vier Patient Education  2024 [employer].

## 2024-02-01 ENCOUNTER — Ambulatory Visit: Admitting: Nurse Practitioner

## 2024-02-01 ENCOUNTER — Encounter: Payer: Self-pay | Admitting: Nurse Practitioner

## 2024-02-01 VITALS — BP 119/74 | HR 79 | Temp 98.6°F | Ht 63.3 in | Wt 119.6 lb

## 2024-02-01 DIAGNOSIS — Z86711 Personal history of pulmonary embolism: Secondary | ICD-10-CM | POA: Diagnosis not present

## 2024-02-01 DIAGNOSIS — J9601 Acute respiratory failure with hypoxia: Secondary | ICD-10-CM | POA: Insufficient documentation

## 2024-02-01 DIAGNOSIS — J189 Pneumonia, unspecified organism: Secondary | ICD-10-CM | POA: Diagnosis not present

## 2024-02-01 DIAGNOSIS — Z23 Encounter for immunization: Secondary | ICD-10-CM

## 2024-02-01 DIAGNOSIS — R918 Other nonspecific abnormal finding of lung field: Secondary | ICD-10-CM

## 2024-02-01 DIAGNOSIS — I10 Essential (primary) hypertension: Secondary | ICD-10-CM

## 2024-02-01 MED ORDER — ALBUTEROL SULFATE HFA 108 (90 BASE) MCG/ACT IN AERS
2.0000 | INHALATION_SPRAY | Freq: Four times a day (QID) | RESPIRATORY_TRACT | 2 refills | Status: AC | PRN
Start: 2024-02-01 — End: ?

## 2024-02-01 MED ORDER — PREDNISONE 20 MG PO TABS: 40.0000 mg | ORAL_TABLET | Freq: Every day | ORAL | 0 refills | Status: AC

## 2024-02-01 NOTE — Assessment & Plan Note (Signed)
Chronic, ongoing with lifelong need for anticoagulation.  Continue Eliquis and send refills as needed.  Monitor CBC regularly and kidney function.

## 2024-02-01 NOTE — Progress Notes (Signed)
 BP 119/74   Pulse 79   Temp 98.6 F (37 C) (Oral)   Ht 5' 3.3 (1.608 m)   Wt 119 lb 9.6 oz (54.3 kg)   SpO2 98%   BMI 20.99 kg/m    Subjective:    Patient ID: Tracey Perez, female    DOB: 01/18/1966, 58 y.o.   MRN: [id number]  HPI: ROSSELYN Perez is a 58 y.o. female  Chief Complaint  Patient presents with   Hospitalization Follow-up    Diagnosed with pneumonia 01/16/24   Transition of Care Hospital Follow up.  Was admitted to [facility] on 01/16/24 for pneumonia, had a cough for some days that became worse.  Was treated in hospital with abx therapy.  Discharged on 01/18/24.  Since coming home she is still tired and coughing a lot.  Was sent home with Amoxicillin 1000 MG TID, this led to thrush and she is currently using Nystatin which is helping. Has history of PE and continues on her Eliquis .  Has completed her abx therapy, only took 4-5 days worth.  History of present illness:  From admission h and p [name] is a pleasant 58 y.o. female with medical history significant for HTN, PE/DVT on Eliquis , history of smoking who came into ED for evaluation of chest pain, cough and shortness of breath for 4 days.  Patient stated that her pain was retrosternal, sharp, 9/10, constant, nonradiating, not associated with nausea or vomiting.  She had cough which was dry not bringing up phlegm or sputum no blood.  Patient was taking Mucinex without any relief.  She was found to have oxygen saturation 82% on room air. Patient denies any fever, chills, leg swelling, hemoptysis, hematemesis, melena, diarrhea.   Hospital Course:    # CAP # Rhinovirus Several days cough, infiltrate on CT, no PE, no chf. Procal only mildly elevated, no leukocytosis or fever, not clear this is bacterial. Covid/flu/rsv neg, positive for rhinovirus - improving and given above think most likely this is a viral pneumonia, but given prevalence of rhinovirus we can't always assume that a positive result means it's the  cause, so shared decision to prescribe amoxicillin but patient will fill only if she fails to continue to improve   # Acute hypoxic respiratory failure 2/2 above process, 82 on arrival, now weaned to room air and ambulated without desaturation   # Hypokalemia Chronic, no vomiting/diarrhea. Doesn't appear to be on meds that would cause this. Mg wnl. With hx htn, hyperaldo? - renin/aldo pending   # Hx dvt/pe No PE on CTA - cont home apixaban    # Chronic pain - home venlafaxine , gabapentin , home oxy   # MDD - home mirtazapine   Hospital/Facility: [facility] D/C Physician: Dr. Kandis D/C Date: 01/18/24  Records Requested: 02/01/24 Records Received: 02/01/24 Records Reviewed: 02/01/24  Diagnoses on Discharge: CAP  Date of interactive Contact within 48 hours of discharge:  Contact was through: none  Date of 7 day or 14 day face-to-face visit:    within 14 days  Outpatient Encounter Medications as of 02/01/2024  Medication Sig   albuterol (VENTOLIN HFA) 108 (90 Base) MCG/ACT inhaler Inhale 2 puffs into the lungs every 6 (six) hours as needed for wheezing or shortness of breath.   benzonatate  (TESSALON ) 100 MG capsule Take 1 capsule (100 mg total) by mouth 3 (three) times daily as needed for cough.   Blood Pressure Monitoring (BLOOD PRESSURE KIT) KIT 1 kit by Does not apply route 2 (two) times  daily.   diphenhydrAMINE  (BENADRYL ) 25 mg capsule Take 50 mg by mouth 4 (four) times daily as needed for allergies or sleep.   ELIQUIS  5 MG TABS tablet TAKE 1 TABLET(5 MG) BY MOUTH TWICE DAILY   gabapentin  (NEURONTIN ) 800 MG tablet Take 800 mg by mouth 4 (four) times daily.   hydrALAZINE  (APRESOLINE ) 25 MG tablet TAKE 1 TABLET BY MOUTH DAILY AS NEEDED FOR BLOOD PRESSURE OVER 130/80   levothyroxine  (SYNTHROID ) 50 MCG tablet Take 1 tablet (50 mcg total) by mouth daily.   mirtazapine  (REMERON ) 30 MG tablet Take 30 mg by mouth at bedtime.   nystatin (MYCOSTATIN) 100000 UNIT/ML suspension Take 5 mLs  (500,000 Units total) by mouth 4 (four) times daily.   oxycodone  (ROXICODONE ) 30 MG immediate release tablet Take 1 tablet by mouth every 4 (four) hours as needed.   predniSONE (DELTASONE) 20 MG tablet Take 2 tablets (40 mg total) by mouth daily with breakfast for 5 days.   venlafaxine  XR (EFFEXOR -XR) 150 MG 24 hr capsule Take 300 mg by mouth in the morning.   zolpidem  (AMBIEN ) 10 MG tablet Take 10 mg by mouth at bedtime.   No facility-administered encounter medications on file as of 02/01/2024.    Diagnostic Tests Reviewed/Disposition:     Latest Ref Rng & Units 01/17/2024    2:51 AM 01/16/2024    9:30 AM 04/10/2023    9:19 AM  CBC  WBC 4.0 - 10.5 K/uL 5.5  7.7  4.7   Hemoglobin 12.0 - 15.0 g/dL 89.2  88.7  85.6   Hematocrit 36.0 - 46.0 % 30.6  31.6  43.6   Platelets 150 - 400 K/uL 195  198  306     CMP     Component Value Date/Time   NA 142 01/18/2024 0903   NA 142 12/09/2023 0912   K 3.0 (L) 01/18/2024 0903   CL 101 01/18/2024 0903   CO2 27 01/18/2024 0903   GLUCOSE 121 (H) 01/18/2024 0903   BUN <5 (L) 01/18/2024 0903   BUN 13 12/09/2023 0912   CREATININE 0.57 01/18/2024 0903   CALCIUM 8.6 (L) 01/18/2024 0903   PROT 6.2 (L) 01/16/2024 0959   PROT 6.3 10/12/2023 1323   ALBUMIN 3.0 (L) 01/16/2024 0959   ALBUMIN 4.4 10/12/2023 1323   AST 35 01/16/2024 0959   ALT 14 01/16/2024 0959   ALKPHOS 75 01/16/2024 0959   BILITOT 0.3 01/16/2024 0959   BILITOT 0.3 10/12/2023 1323   EGFR 79 12/09/2023 0912   GFRNONAA >60 01/18/2024 0903   Consults: none  Discharge Instructions: Pcp f/u 1 week  F/u hyperaldosteronism labs (pending at time of d/c)  Disease/illness Education: Reviewed at length with patient today.  Home Health/Community Services Discussions/Referrals: none  Establishment or re-establishment of referral orders for community resources: none  Discussion with other health care providers: Reviewed all recent notes  Assessment and Support of treatment regimen  adherence: Reviewed at length with patient today.  Appointments Coordinated with: Reviewed at length with patient today.  Education for self-management, independent living, and ADLs:  Reviewed at length with patient today.  Relevant past medical, surgical, family and social history reviewed and updated as indicated. Interim medical history since our last visit reviewed. Allergies and medications reviewed and updated.  Review of Systems  Constitutional:  Positive for fatigue. Negative for activity change, appetite change, diaphoresis and fever.  HENT:  Negative for congestion, postnasal drip, rhinorrhea, sinus pressure, sinus pain and sore throat.   Respiratory:  Positive for  cough, chest tightness (just when coughing a lot) and wheezing (when coughing a lot). Negative for shortness of breath.   Cardiovascular:  Negative for chest pain, palpitations and leg swelling.  Gastrointestinal: Negative.   Endocrine: Negative.   Neurological: Negative.   Psychiatric/Behavioral: Negative.      Per HPI unless specifically indicated above     Objective:    BP 119/74   Pulse 79   Temp 98.6 F (37 C) (Oral)   Ht 5' 3.3 (1.608 m)   Wt 119 lb 9.6 oz (54.3 kg)   SpO2 98%   BMI 20.99 kg/m   Wt Readings from Last 3 Encounters:  02/01/24 119 lb 9.6 oz (54.3 kg)  01/16/24 125 lb 7.1 oz (56.9 kg)  10/12/23 112 lb 12.8 oz (51.2 kg)    Physical Exam Vitals and nursing note reviewed.  Constitutional:      General: She is awake. She is not in acute distress.    Appearance: She is well-developed and well-groomed. She is not ill-appearing or toxic-appearing.  HENT:     Head: Normocephalic.     Right Ear: Hearing and external ear normal.     Left Ear: Hearing and external ear normal.  Eyes:     General: Lids are normal.        Right eye: No discharge.        Left eye: No discharge.     Conjunctiva/sclera: Conjunctivae normal.     Pupils: Pupils are equal, round, and reactive to light.  Neck:      Thyroid : No thyromegaly.     Vascular: No carotid bruit.  Cardiovascular:     Rate and Rhythm: Normal rate and regular rhythm.     Heart sounds: Normal heart sounds. No murmur heard.    No gallop.  Pulmonary:     Effort: Pulmonary effort is normal. No accessory muscle usage or respiratory distress.     Breath sounds: Wheezing present. No decreased breath sounds or rales.     Comments: Intermittent expiratory wheezes noted. No SOB with talking. Abdominal:     General: Bowel sounds are normal. There is no distension.     Palpations: Abdomen is soft.     Tenderness: There is no abdominal tenderness.  Musculoskeletal:     Cervical back: Normal range of motion and neck supple.     Right lower leg: No edema.     Left lower leg: No edema.  Lymphadenopathy:     Cervical: No cervical adenopathy.  Skin:    General: Skin is warm and dry.  Neurological:     Mental Status: She is alert and oriented to person, place, and time.     Deep Tendon Reflexes: Reflexes are normal and symmetric.     Reflex Scores:      Brachioradialis reflexes are 2+ on the right side and 2+ on the left side.      Patellar reflexes are 2+ on the right side and 2+ on the left side. Psychiatric:        Attention and Perception: Attention normal.        Mood and Affect: Mood normal.        Speech: Speech normal.        Behavior: Behavior normal. Behavior is cooperative.        Thought Content: Thought content normal.    Results for orders placed or performed during the hospital encounter of 01/16/24  Basic metabolic panel   Collection Time: 01/16/24  9:30  AM  Result Value Ref Range   Sodium 138 135 - 145 mmol/L   Potassium 2.4 (LL) 3.5 - 5.1 mmol/L   Chloride 97 (L) 98 - 111 mmol/L   CO2 30 22 - 32 mmol/L   Glucose, Bld 115 (H) 70 - 99 mg/dL   BUN 11 6 - 20 mg/dL   Creatinine, Ser 9.11 0.44 - 1.00 mg/dL   Calcium 8.6 (L) 8.9 - 10.3 mg/dL   GFR, Estimated >39 >39 mL/min   Anion gap 11 5 - 15  CBC    Collection Time: 01/16/24  9:30 AM  Result Value Ref Range   WBC 7.7 4.0 - 10.5 K/uL   RBC 3.57 (L) 3.87 - 5.11 MIL/uL   Hemoglobin 11.2 (L) 12.0 - 15.0 g/dL   HCT 68.3 (L) 63.9 - 53.9 %   MCV 88.5 80.0 - 100.0 fL   MCH 31.4 26.0 - 34.0 pg   MCHC 35.4 30.0 - 36.0 g/dL   RDW 87.0 88.4 - 84.4 %   Platelets 198 150 - 400 K/uL   nRBC 0.0 0.0 - 0.2 %  Magnesium   Collection Time: 01/16/24  9:30 AM  Result Value Ref Range   Magnesium 1.8 1.7 - 2.4 mg/dL  Troponin I (High Sensitivity)   Collection Time: 01/16/24  9:30 AM  Result Value Ref Range   Troponin I (High Sensitivity) 5 <18 ng/L  Hepatic function panel   Collection Time: 01/16/24  9:59 AM  Result Value Ref Range   Total Protein 6.2 (L) 6.5 - 8.1 g/dL   Albumin 3.0 (L) 3.5 - 5.0 g/dL   AST 35 15 - 41 U/L   ALT 14 0 - 44 U/L   Alkaline Phosphatase 75 38 - 126 U/L   Total Bilirubin 0.3 0.0 - 1.2 mg/dL   Bilirubin, Direct <9.8 0.0 - 0.2 mg/dL   Indirect Bilirubin NOT CALCULATED 0.3 - 0.9 mg/dL  Brain natriuretic peptide   Collection Time: 01/16/24  9:59 AM  Result Value Ref Range   B Natriuretic Peptide 41.5 0.0 - 100.0 pg/mL  Protime-INR   Collection Time: 01/16/24  9:59 AM  Result Value Ref Range   Prothrombin Time 17.4 (H) 11.4 - 15.2 seconds   INR 1.4 (H) 0.8 - 1.2  Resp panel by RT-PCR (RSV, Flu A&B, Covid) Anterior Nasal Swab   Collection Time: 01/16/24 11:14 AM   Specimen: Anterior Nasal Swab  Result Value Ref Range   SARS Coronavirus 2 by RT PCR NEGATIVE NEGATIVE   Influenza A by PCR NEGATIVE NEGATIVE   Influenza B by PCR NEGATIVE NEGATIVE   Resp Syncytial Virus by PCR NEGATIVE NEGATIVE  Blood culture (routine x 2)   Collection Time: 01/16/24 11:15 AM   Specimen: BLOOD LEFT ARM  Result Value Ref Range   Specimen Description BLOOD LEFT ARM    Special Requests      BOTTLES DRAWN AEROBIC AND ANAEROBIC Blood Culture results may not be optimal due to an inadequate volume of blood received in culture bottles    Culture      NO GROWTH 5 DAYS Performed at [facility], [address] [place], [place] [address]    Report Status 01/21/2024 FINAL   Lactic acid, plasma   Collection Time: 01/16/24 11:15 AM  Result Value Ref Range   Lactic Acid, Venous 0.6 0.5 - 1.9 mmol/L  MRSA Next Gen by PCR, Nasal   Collection Time: 01/16/24 11:49 AM   Specimen: Nasal Mucosa; Nasal Swab  Result  Value Ref Range   MRSA by PCR Next Gen NOT DETECTED NOT DETECTED  Legionella Pneumophila Serogp 1 Ur Ag   Collection Time: 01/16/24 12:42 PM  Result Value Ref Range   L. pneumophila Serogp 1 Ur Ag Negative Negative   Source of Sample URINE, RANDOM   Strep pneumoniae urinary antigen   Collection Time: 01/16/24  2:01 PM  Result Value Ref Range   Strep Pneumo Urinary Antigen NEGATIVE NEGATIVE  Lactic acid, plasma   Collection Time: 01/16/24  4:08 PM  Result Value Ref Range   Lactic Acid, Venous 0.5 0.5 - 1.9 mmol/L  HIV Antibody (routine testing w rflx)   Collection Time: 01/16/24  4:08 PM  Result Value Ref Range   HIV Screen 4th Generation wRfx Non Reactive Non Reactive  Procalcitonin   Collection Time: 01/16/24  4:08 PM  Result Value Ref Range   Procalcitonin 0.15 ng/mL  Basic metabolic panel with GFR   Collection Time: 01/16/24  4:08 PM  Result Value Ref Range   Sodium 136 135 - 145 mmol/L   Potassium 3.6 3.5 - 5.1 mmol/L   Chloride 98 98 - 111 mmol/L   CO2 29 22 - 32 mmol/L   Glucose, Bld 98 70 - 99 mg/dL   BUN 10 6 - 20 mg/dL   Creatinine, Ser 9.42 0.44 - 1.00 mg/dL   Calcium 8.3 (L) 8.9 - 10.3 mg/dL   GFR, Estimated >39 >39 mL/min   Anion gap 9 5 - 15  Blood culture (routine x 2)   Collection Time: 01/16/24  9:19 PM   Specimen: BLOOD  Result Value Ref Range   Specimen Description BLOOD BLOOD LEFT HAND    Special Requests      BOTTLES DRAWN AEROBIC ONLY Blood Culture results may not be optimal due to an inadequate volume of blood received in culture bottles   Culture      NO GROWTH 5  DAYS Performed at [facility], [address] [place], [place] [address]    Report Status 01/21/2024 FINAL   Basic metabolic panel   Collection Time: 01/17/24  2:51 AM  Result Value Ref Range   Sodium 141 135 - 145 mmol/L   Potassium 3.2 (L) 3.5 - 5.1 mmol/L   Chloride 104 98 - 111 mmol/L   CO2 28 22 - 32 mmol/L   Glucose, Bld 98 70 - 99 mg/dL   BUN 7 6 - 20 mg/dL   Creatinine, Ser 9.35 0.44 - 1.00 mg/dL   Calcium 8.6 (L) 8.9 - 10.3 mg/dL   GFR, Estimated >39 >39 mL/min   Anion gap 9 5 - 15  CBC   Collection Time: 01/17/24  2:51 AM  Result Value Ref Range   WBC 5.5 4.0 - 10.5 K/uL   RBC 3.42 (L) 3.87 - 5.11 MIL/uL   Hemoglobin 10.7 (L) 12.0 - 15.0 g/dL   HCT 69.3 (L) 63.9 - 53.9 %   MCV 89.5 80.0 - 100.0 fL   MCH 31.3 26.0 - 34.0 pg   MCHC 35.0 30.0 - 36.0 g/dL   RDW 86.9 88.4 - 84.4 %   Platelets 195 150 - 400 K/uL   nRBC 0.0 0.0 - 0.2 %  Protime-INR   Collection Time: 01/17/24  2:51 AM  Result Value Ref Range   Prothrombin Time 15.8 (H) 11.4 - 15.2 seconds   INR 1.2 0.8 - 1.2  Respiratory (~20 pathogens) panel by PCR   Collection Time: 01/17/24 11:49 AM   Specimen: Nasopharyngeal  Swab; Respiratory  Result Value Ref Range   Adenovirus NOT DETECTED NOT DETECTED   Coronavirus 229E NOT DETECTED NOT DETECTED   Coronavirus HKU1 NOT DETECTED NOT DETECTED   Coronavirus NL63 NOT DETECTED NOT DETECTED   Coronavirus OC43 NOT DETECTED NOT DETECTED   Metapneumovirus NOT DETECTED NOT DETECTED   Rhinovirus / Enterovirus DETECTED (A) NOT DETECTED   Influenza A NOT DETECTED NOT DETECTED   Influenza B NOT DETECTED NOT DETECTED   Parainfluenza Virus 1 NOT DETECTED NOT DETECTED   Parainfluenza Virus 2 NOT DETECTED NOT DETECTED   Parainfluenza Virus 3 NOT DETECTED NOT DETECTED   Parainfluenza Virus 4 NOT DETECTED NOT DETECTED   Respiratory Syncytial Virus NOT DETECTED NOT DETECTED   Bordetella pertussis NOT DETECTED NOT DETECTED   Bordetella Parapertussis NOT DETECTED  NOT DETECTED   Chlamydophila pneumoniae NOT DETECTED NOT DETECTED   Mycoplasma pneumoniae NOT DETECTED NOT DETECTED  Expectorated Sputum Assessment w Gram Stain, Rflx to Resp Cult   Collection Time: 01/17/24  3:12 PM   Specimen: Sputum  Result Value Ref Range   Specimen Description SPUTUM    Special Requests NONE    Sputum evaluation      THIS SPECIMEN IS ACCEPTABLE FOR SPUTUM CULTURE Performed at [facility], [address] [place], [place] [address]    Report Status 01/17/2024 FINAL   Culture, Respiratory w Gram Stain   Collection Time: 01/17/24  3:12 PM   Specimen: SPU  Result Value Ref Range   Specimen Description      SPUTUM Performed at [facility], [address] [place], [place] [address]    Special Requests      NONE Reflexed from [id number] Performed at [facility], [address] [place], [place] [address]    Gram Stain NO WBC SEEN NO ORGANISMS SEEN     Culture      RARE Normal respiratory flora-no Staph aureus or Pseudomonas seen Performed at [facility], [address], [place], [place] [address]    Report Status 01/19/2024 FINAL   Aldosterone + renin activity w/ ratio   Collection Time: 01/18/24  9:03 AM  Result Value Ref Range   PRA LC/MS/MS <0.167 (L) 0.167 - 5.380 ng/mL/hr   ALDO / PRA Ratio UNABLE TO CALCULATE    Aldosterone <1.0 0.0 - 30.0 ng/dL  Basic metabolic panel with GFR   Collection Time: 01/18/24  9:03 AM  Result Value Ref Range   Sodium 142 135 - 145 mmol/L   Potassium 3.0 (L) 3.5 - 5.1 mmol/L   Chloride 101 98 - 111 mmol/L   CO2 27 22 - 32 mmol/L   Glucose, Bld 121 (H) 70 - 99 mg/dL   BUN <5 (L) 6 - 20 mg/dL   Creatinine, Ser 9.42 0.44 - 1.00 mg/dL   Calcium 8.6 (L) 8.9 - 10.3 mg/dL   GFR, Estimated >39 >39 mL/min   Anion gap 14 5 - 15      Assessment & Plan:   Problem List Items Addressed This Visit       Respiratory   CAP (community acquired pneumonia)   Acute, diagnosed on 01/16/24.  Treated and has completed all abx therapy.  Will plan on repeat CXR around 02/27/24. Some mild wheezing remains and cough. Start Prednisone 40 Mg daily for 5 days and Albuterol inhaler as needed.  Continue Tessalon  as needed.  If any worsening symptoms to alert PCP ASAP. Provide PCV20 and Flu in 2 weeks.  Relevant Medications   albuterol (VENTOLIN HFA) 108 (90 Base) MCG/ACT inhaler   Other Relevant Orders   Basic metabolic panel with GFR   CBC with Differential/Platelet   Acute hypoxic respiratory failure (HCC) - Primary   Refer to CAP plan of care. Labs today.      Relevant Orders   Basic metabolic panel with GFR   CBC with Differential/Platelet     Other   History of pulmonary embolism   Chronic, ongoing with lifelong need for anticoagulation.  Continue Eliquis  and send refills as needed.  Monitor CBC regularly and kidney function.          Follow up plan: Return in about 2 weeks (around 02/15/2024) for PNEUMONIA -- lung recheck.

## 2024-02-01 NOTE — Assessment & Plan Note (Addendum)
 Acute, diagnosed on 01/16/24. Treated and has completed all abx therapy.  Will plan on repeat CXR around 02/27/24. Some mild wheezing remains and cough. Start Prednisone 40 Mg daily for 5 days and Albuterol inhaler as needed.  Continue Tessalon  as needed.  If any worsening symptoms to alert PCP ASAP. Provide PCV20 and Flu in 2 weeks.

## 2024-02-01 NOTE — Assessment & Plan Note (Signed)
 Refer to CAP plan of care. Labs today.

## 2024-02-14 NOTE — Patient Instructions (Incomplete)
 Go for repeat chest x-ray around 02/27/24 to recheck pneumonia, as Tracey Perez  Be Involved in Caring For Your Health:  Taking Medications When medications are taken as directed, they can greatly improve your health. But if they are not taken as prescribed, they may not work. In some cases, not taking them correctly can be harmful. To help ensure your treatment remains effective and safe, understand your medications and how to take them. Bring your medications to each visit for review by your provider.  Your lab results, notes, and after visit summary will be available on My Chart. We strongly encourage you to use this feature. If lab results are abnormal the clinic will contact you with the appropriate steps. If the clinic does not contact you assume the results are satisfactory. You can always view your results on My Chart. If you have questions regarding your health or results, please contact the clinic during office hours. You can also ask questions on My Chart.  We at [facility] are grateful that you chose us  to provide your care. We strive to provide evidence-based and compassionate care and are always looking for feedback. If you get a survey from the clinic please complete this so we can hear your opinions.   Hypokalemia Hypokalemia means that the amount of potassium in the blood is lower than normal. Potassium is a mineral (electrolyte) that helps regulate the amount of fluid in the body. It also stimulates muscle tightening (contraction) and helps nerves work properly. Normally, most of the body's potassium is inside cells, and only a very small amount is in the blood. Because the amount in the blood is so small, minor changes to potassium levels in the blood can be life-threatening. What are the causes? This condition may be caused by: Antibiotic medicine. Diarrhea or vomiting. Taking too much of a medicine that helps you have a bowel movement (laxative) can  cause diarrhea and lead to hypokalemia. Chronic kidney disease (CKD). Medicines that help the body get rid of excess fluid (diuretics). Eating disorders, such as anorexia or bulimia. Low magnesium levels in the body. Sweating a lot. What are the signs or symptoms? Symptoms of this condition include: Weakness. Constipation. Fatigue. Muscle cramps. Mental confusion. Skipped heartbeats or irregular heartbeat (palpitations). Tingling or numbness. How is this diagnosed? This condition is diagnosed with a blood test. How is this treated? This condition may be treated by: Taking potassium supplements. Adjusting the medicines that you take. Eating more foods that contain a lot of potassium. If your potassium level is very low, you may need to get potassium through an IV and be monitored in the hospital. Follow these instructions at home: Eating and drinking  Eat a healthy diet. A healthy diet includes fresh fruits and vegetables, whole grains, healthy fats, and lean proteins. If told, eat more foods that contain a lot of potassium. These include: Nuts, such as peanuts and pistachios. Seeds, such as sunflower seeds and pumpkin seeds. Peas, lentils, and lima beans. Whole grain and bran cereals and breads. Fresh fruits and vegetables, such as apricots, avocado, bananas, cantaloupe, kiwi, oranges, tomatoes, asparagus, and potatoes. Juices, such as orange, tomato, and prune. Lean meats, including fish. Milk and milk products, such as yogurt. General instructions Take over-the-counter and prescription medicines only as told by your health care provider. This includes vitamins, natural food products, and supplements. Keep all follow-up visits. This is important. Contact a health care provider if: You have weakness that gets worse. You  feel your heart pounding or racing. You vomit. You have diarrhea. You have diabetes and you have trouble keeping your blood sugar in your target  range. Get help right away if: You have chest pain. You have shortness of breath. You have vomiting or diarrhea that lasts for more than 2 days. You faint. These symptoms may be an emergency. Get help right away. Call 911. Do not wait to see if the symptoms will go away. Do not drive yourself to the hospital. Summary Hypokalemia means that the amount of potassium in the blood is lower than normal. This condition is diagnosed with a blood test. Hypokalemia may be treated by taking potassium supplements, adjusting the medicines that you take, or eating more foods that are high in potassium. If your potassium level is very low, you may need to get potassium through an IV and be monitored in the hospital. This information is not intended to replace advice given to you by your health care provider. Make sure you discuss any questions you have with your health care provider. Document Revised: 12/20/2020 Document Reviewed: 12/20/2020 Elsevier Patient Education  2024 [employer].

## 2024-02-15 ENCOUNTER — Ambulatory Visit: Admitting: Nurse Practitioner

## 2024-02-15 ENCOUNTER — Encounter: Payer: Self-pay | Admitting: Nurse Practitioner

## 2024-02-15 VITALS — BP 121/75 | HR 88 | Temp 98.9°F | Resp 15 | Ht 63.31 in | Wt 125.2 lb

## 2024-02-15 DIAGNOSIS — Z23 Encounter for immunization: Secondary | ICD-10-CM

## 2024-02-15 DIAGNOSIS — J9601 Acute respiratory failure with hypoxia: Secondary | ICD-10-CM

## 2024-02-15 DIAGNOSIS — E876 Hypokalemia: Secondary | ICD-10-CM | POA: Diagnosis not present

## 2024-02-15 DIAGNOSIS — J189 Pneumonia, unspecified organism: Secondary | ICD-10-CM

## 2024-02-15 NOTE — Assessment & Plan Note (Signed)
 Acute, diagnosed on 01/16/24. Overall symptoms improved at this time.  Will plan on repeat CXR around 02/27/24, she is aware to obtain then. Continue Tessalon  as needed.  If any worsening symptoms to alert PCP ASAP. Provide PCV20 and Flu today since no further symptoms.

## 2024-02-15 NOTE — Progress Notes (Signed)
 BP 121/75 (BP Location: Left Arm, Patient Position: Sitting, Cuff Size: Normal)   Pulse 88   Temp 98.9 F (37.2 C) (Oral)   Resp 15   Ht 5' 3.31 (1.608 m)   Wt 125 lb 3.2 oz (56.8 kg)   SpO2 98%   BMI 21.96 kg/m    Subjective:    Patient ID: Tracey Perez, female    DOB: 1965/09/22, 58 y.o.   MRN: [id number]  HPI: Tracey Perez is a 58 y.o. female  Chief Complaint  Patient presents with   Pneumonia    Follow up. Feeling much better, much less pain with coughing and able to breathe.    PNEUMONIA Diagnosed on 01/16/24 and treated in hospital with abx therapy.  Provided Prednisone burst last visit to assist with remaining wheezing.  Due for repeat CXR 02/27/24. Overall is feeling 100% better at this time.  Mild hypokalemia on last labs. Recurrent headaches: no Visual changes: no Palpitations: no Dyspnea: no Chest pain: no Lower extremity edema: no Dizzy/lightheaded: no  Relevant past medical, surgical, family and social history reviewed and updated as indicated. Interim medical history since our last visit reviewed. Allergies and medications reviewed and updated.  Review of Systems  Constitutional:  Negative for activity change, appetite change, diaphoresis, fatigue and fever.  Respiratory:  Negative for cough, chest tightness, shortness of breath and wheezing.   Cardiovascular:  Negative for chest pain, palpitations and leg swelling.  Gastrointestinal: Negative.   Neurological: Negative.   Psychiatric/Behavioral: Negative.      Per HPI unless specifically indicated above     Objective:    BP 121/75 (BP Location: Left Arm, Patient Position: Sitting, Cuff Size: Normal)   Pulse 88   Temp 98.9 F (37.2 C) (Oral)   Resp 15   Ht 5' 3.31 (1.608 m)   Wt 125 lb 3.2 oz (56.8 kg)   SpO2 98%   BMI 21.96 kg/m   Wt Readings from Last 3 Encounters:  02/15/24 125 lb 3.2 oz (56.8 kg)  02/01/24 119 lb 9.6 oz (54.3 kg)  01/16/24 125 lb 7.1 oz (56.9 kg)    Physical  Exam Vitals and nursing note reviewed.  Constitutional:      General: She is awake. She is not in acute distress.    Appearance: She is well-developed and well-groomed. She is not ill-appearing or toxic-appearing.  HENT:     Head: Normocephalic.     Right Ear: Hearing, tympanic membrane, ear canal and external ear normal.     Left Ear: Hearing, tympanic membrane, ear canal and external ear normal.     Nose: Nose normal.     Right Sinus: No maxillary sinus tenderness or frontal sinus tenderness.     Left Sinus: No maxillary sinus tenderness or frontal sinus tenderness.     Mouth/Throat:     Mouth: Mucous membranes are moist.  Eyes:     General: Lids are normal.        Right eye: No discharge.        Left eye: No discharge.     Conjunctiva/sclera: Conjunctivae normal.     Pupils: Pupils are equal, round, and reactive to light.  Neck:     Thyroid : No thyromegaly.     Vascular: No carotid bruit.  Cardiovascular:     Rate and Rhythm: Normal rate and regular rhythm.     Heart sounds: Normal heart sounds. No murmur heard.    No gallop.  Pulmonary:  Effort: Pulmonary effort is normal. No accessory muscle usage or respiratory distress.     Breath sounds: Normal breath sounds. No decreased breath sounds, wheezing or rales.  Abdominal:     General: Bowel sounds are normal. There is no distension.     Palpations: Abdomen is soft.     Tenderness: There is no abdominal tenderness.  Musculoskeletal:     Cervical back: Normal range of motion and neck supple.     Right lower leg: No edema.     Left lower leg: No edema.  Lymphadenopathy:     Cervical: No cervical adenopathy.  Skin:    General: Skin is warm and dry.  Neurological:     Mental Status: She is alert and oriented to person, place, and time.     Deep Tendon Reflexes: Reflexes are normal and symmetric.     Reflex Scores:      Brachioradialis reflexes are 2+ on the right side and 2+ on the left side.      Patellar reflexes are  2+ on the right side and 2+ on the left side. Psychiatric:        Attention and Perception: Attention normal.        Mood and Affect: Mood normal.        Speech: Speech normal.        Behavior: Behavior normal. Behavior is cooperative.        Thought Content: Thought content normal.     Results for orders placed or performed during the hospital encounter of 01/16/24  Basic metabolic panel   Collection Time: 01/16/24  9:30 AM  Result Value Ref Range   Sodium 138 135 - 145 mmol/L   Potassium 2.4 (LL) 3.5 - 5.1 mmol/L   Chloride 97 (L) 98 - 111 mmol/L   CO2 30 22 - 32 mmol/L   Glucose, Bld 115 (H) 70 - 99 mg/dL   BUN 11 6 - 20 mg/dL   Creatinine, Ser 9.11 0.44 - 1.00 mg/dL   Calcium 8.6 (L) 8.9 - 10.3 mg/dL   GFR, Estimated >39 >39 mL/min   Anion gap 11 5 - 15  CBC   Collection Time: 01/16/24  9:30 AM  Result Value Ref Range   WBC 7.7 4.0 - 10.5 K/uL   RBC 3.57 (L) 3.87 - 5.11 MIL/uL   Hemoglobin 11.2 (L) 12.0 - 15.0 g/dL   HCT 68.3 (L) 63.9 - 53.9 %   MCV 88.5 80.0 - 100.0 fL   MCH 31.4 26.0 - 34.0 pg   MCHC 35.4 30.0 - 36.0 g/dL   RDW 87.0 88.4 - 84.4 %   Platelets 198 150 - 400 K/uL   nRBC 0.0 0.0 - 0.2 %  Magnesium   Collection Time: 01/16/24  9:30 AM  Result Value Ref Range   Magnesium 1.8 1.7 - 2.4 mg/dL  Troponin I (High Sensitivity)   Collection Time: 01/16/24  9:30 AM  Result Value Ref Range   Troponin I (High Sensitivity) 5 <18 ng/L  Hepatic function panel   Collection Time: 01/16/24  9:59 AM  Result Value Ref Range   Total Protein 6.2 (L) 6.5 - 8.1 g/dL   Albumin 3.0 (L) 3.5 - 5.0 g/dL   AST 35 15 - 41 U/L   ALT 14 0 - 44 U/L   Alkaline Phosphatase 75 38 - 126 U/L   Total Bilirubin 0.3 0.0 - 1.2 mg/dL   Bilirubin, Direct <9.8 0.0 - 0.2 mg/dL   Indirect Bilirubin NOT  CALCULATED 0.3 - 0.9 mg/dL  Brain natriuretic peptide   Collection Time: 01/16/24  9:59 AM  Result Value Ref Range   B Natriuretic Peptide 41.5 0.0 - 100.0 pg/mL  Protime-INR    Collection Time: 01/16/24  9:59 AM  Result Value Ref Range   Prothrombin Time 17.4 (H) 11.4 - 15.2 seconds   INR 1.4 (H) 0.8 - 1.2  Resp panel by RT-PCR (RSV, Flu A&B, Covid) Anterior Nasal Swab   Collection Time: 01/16/24 11:14 AM   Specimen: Anterior Nasal Swab  Result Value Ref Range   SARS Coronavirus 2 by RT PCR NEGATIVE NEGATIVE   Influenza A by PCR NEGATIVE NEGATIVE   Influenza B by PCR NEGATIVE NEGATIVE   Resp Syncytial Virus by PCR NEGATIVE NEGATIVE  Blood culture (routine x 2)   Collection Time: 01/16/24 11:15 AM   Specimen: BLOOD LEFT ARM  Result Value Ref Range   Specimen Description BLOOD LEFT ARM    Special Requests      BOTTLES DRAWN AEROBIC AND ANAEROBIC Blood Culture results may not be optimal due to an inadequate volume of blood received in culture bottles   Culture      NO GROWTH 5 DAYS Performed at [facility], [address] [place], [place] [address]    Report Status 01/21/2024 FINAL   Lactic acid, plasma   Collection Time: 01/16/24 11:15 AM  Result Value Ref Range   Lactic Acid, Venous 0.6 0.5 - 1.9 mmol/L  MRSA Next Gen by PCR, Nasal   Collection Time: 01/16/24 11:49 AM   Specimen: Nasal Mucosa; Nasal Swab  Result Value Ref Range   MRSA by PCR Next Gen NOT DETECTED NOT DETECTED  Legionella Pneumophila Serogp 1 Ur Ag   Collection Time: 01/16/24 12:42 PM  Result Value Ref Range   L. pneumophila Serogp 1 Ur Ag Negative Negative   Source of Sample URINE, RANDOM   Strep pneumoniae urinary antigen   Collection Time: 01/16/24  2:01 PM  Result Value Ref Range   Strep Pneumo Urinary Antigen NEGATIVE NEGATIVE  Lactic acid, plasma   Collection Time: 01/16/24  4:08 PM  Result Value Ref Range   Lactic Acid, Venous 0.5 0.5 - 1.9 mmol/L  HIV Antibody (routine testing w rflx)   Collection Time: 01/16/24  4:08 PM  Result Value Ref Range   HIV Screen 4th Generation wRfx Non Reactive Non Reactive  Procalcitonin   Collection Time: 01/16/24  4:08 PM   Result Value Ref Range   Procalcitonin 0.15 ng/mL  Basic metabolic panel with GFR   Collection Time: 01/16/24  4:08 PM  Result Value Ref Range   Sodium 136 135 - 145 mmol/L   Potassium 3.6 3.5 - 5.1 mmol/L   Chloride 98 98 - 111 mmol/L   CO2 29 22 - 32 mmol/L   Glucose, Bld 98 70 - 99 mg/dL   BUN 10 6 - 20 mg/dL   Creatinine, Ser 9.42 0.44 - 1.00 mg/dL   Calcium 8.3 (L) 8.9 - 10.3 mg/dL   GFR, Estimated >39 >39 mL/min   Anion gap 9 5 - 15  Blood culture (routine x 2)   Collection Time: 01/16/24  9:19 PM   Specimen: BLOOD  Result Value Ref Range   Specimen Description BLOOD BLOOD LEFT HAND    Special Requests      BOTTLES DRAWN AEROBIC ONLY Blood Culture results may not be optimal due to an inadequate volume of blood received in culture bottles   Culture  NO GROWTH 5 DAYS Performed at [facility], [address] [place], [place] [address]    Report Status 01/21/2024 FINAL   Basic metabolic panel   Collection Time: 01/17/24  2:51 AM  Result Value Ref Range   Sodium 141 135 - 145 mmol/L   Potassium 3.2 (L) 3.5 - 5.1 mmol/L   Chloride 104 98 - 111 mmol/L   CO2 28 22 - 32 mmol/L   Glucose, Bld 98 70 - 99 mg/dL   BUN 7 6 - 20 mg/dL   Creatinine, Ser 9.35 0.44 - 1.00 mg/dL   Calcium 8.6 (L) 8.9 - 10.3 mg/dL   GFR, Estimated >39 >39 mL/min   Anion gap 9 5 - 15  CBC   Collection Time: 01/17/24  2:51 AM  Result Value Ref Range   WBC 5.5 4.0 - 10.5 K/uL   RBC 3.42 (L) 3.87 - 5.11 MIL/uL   Hemoglobin 10.7 (L) 12.0 - 15.0 g/dL   HCT 69.3 (L) 63.9 - 53.9 %   MCV 89.5 80.0 - 100.0 fL   MCH 31.3 26.0 - 34.0 pg   MCHC 35.0 30.0 - 36.0 g/dL   RDW 86.9 88.4 - 84.4 %   Platelets 195 150 - 400 K/uL   nRBC 0.0 0.0 - 0.2 %  Protime-INR   Collection Time: 01/17/24  2:51 AM  Result Value Ref Range   Prothrombin Time 15.8 (H) 11.4 - 15.2 seconds   INR 1.2 0.8 - 1.2  Respiratory (~20 pathogens) panel by PCR   Collection Time: 01/17/24 11:49 AM   Specimen:  Nasopharyngeal Swab; Respiratory  Result Value Ref Range   Adenovirus NOT DETECTED NOT DETECTED   Coronavirus 229E NOT DETECTED NOT DETECTED   Coronavirus HKU1 NOT DETECTED NOT DETECTED   Coronavirus NL63 NOT DETECTED NOT DETECTED   Coronavirus OC43 NOT DETECTED NOT DETECTED   Metapneumovirus NOT DETECTED NOT DETECTED   Rhinovirus / Enterovirus DETECTED (A) NOT DETECTED   Influenza A NOT DETECTED NOT DETECTED   Influenza B NOT DETECTED NOT DETECTED   Parainfluenza Virus 1 NOT DETECTED NOT DETECTED   Parainfluenza Virus 2 NOT DETECTED NOT DETECTED   Parainfluenza Virus 3 NOT DETECTED NOT DETECTED   Parainfluenza Virus 4 NOT DETECTED NOT DETECTED   Respiratory Syncytial Virus NOT DETECTED NOT DETECTED   Bordetella pertussis NOT DETECTED NOT DETECTED   Bordetella Parapertussis NOT DETECTED NOT DETECTED   Chlamydophila pneumoniae NOT DETECTED NOT DETECTED   Mycoplasma pneumoniae NOT DETECTED NOT DETECTED  Expectorated Sputum Assessment w Gram Stain, Rflx to Resp Cult   Collection Time: 01/17/24  3:12 PM   Specimen: Sputum  Result Value Ref Range   Specimen Description SPUTUM    Special Requests NONE    Sputum evaluation      THIS SPECIMEN IS ACCEPTABLE FOR SPUTUM CULTURE Performed at [facility], [address] [place], [place] [address]    Report Status 01/17/2024 FINAL   Culture, Respiratory w Gram Stain   Collection Time: 01/17/24  3:12 PM   Specimen: SPU  Result Value Ref Range   Specimen Description      SPUTUM Performed at [facility], [address] [place], [place] [address]    Special Requests      NONE Reflexed from [id number] Performed at [facility], [address] [place], [place] [address]    Gram Stain NO WBC SEEN NO ORGANISMS SEEN     Culture      RARE Normal respiratory flora-no  Staph aureus or Pseudomonas seen Performed at [facility], [address] [place], [place] [address]    Report Status 01/19/2024 FINAL    Aldosterone + renin activity w/ ratio   Collection Time: 01/18/24  9:03 AM  Result Value Ref Range   PRA LC/MS/MS <0.167 (L) 0.167 - 5.380 ng/mL/hr   ALDO / PRA Ratio UNABLE TO CALCULATE    Aldosterone <1.0 0.0 - 30.0 ng/dL  Basic metabolic panel with GFR   Collection Time: 01/18/24  9:03 AM  Result Value Ref Range   Sodium 142 135 - 145 mmol/L   Potassium 3.0 (L) 3.5 - 5.1 mmol/L   Chloride 101 98 - 111 mmol/L   CO2 27 22 - 32 mmol/L   Glucose, Bld 121 (H) 70 - 99 mg/dL   BUN <5 (L) 6 - 20 mg/dL   Creatinine, Ser 9.42 0.44 - 1.00 mg/dL   Calcium 8.6 (L) 8.9 - 10.3 mg/dL   GFR, Estimated >39 >39 mL/min   Anion gap 14 5 - 15      Assessment & Plan:   Problem List Items Addressed This Visit       Respiratory   CAP (community acquired pneumonia)   Acute, diagnosed on 01/16/24. Overall symptoms improved at this time.  Will plan on repeat CXR around 02/27/24, she is aware to obtain then. Continue Tessalon  as needed.  If any worsening symptoms to alert PCP ASAP. Provide PCV20 and Flu today since no further symptoms.      Relevant Orders   CBC with Differential/Platelet   DG Chest 2 View   Acute hypoxic respiratory failure (HCC) - Primary   Refer to CAP plan of care. Labs today.      Relevant Orders   CBC with Differential/Platelet   DG Chest 2 View   Other Visit Diagnoses       Hypokalemia       Recheck K+ today and order supplement as needed.   Relevant Orders   Basic metabolic panel with GFR     Flu vaccine need       Flu vaccine in office today, educated patient.   Relevant Orders   Flu vaccine trivalent PF, 6mos and older(Flulaval,Afluria,Fluarix,Fluzone)     Pneumococcal vaccination given       PCV20 vaccine in office today, educated patient.   Relevant Orders   Pneumococcal conjugate vaccine 20-valent        Follow up plan: Return for as scheduled December 23rd.

## 2024-02-15 NOTE — Assessment & Plan Note (Signed)
 Refer to CAP plan of care. Labs today.

## 2024-02-16 ENCOUNTER — Ambulatory Visit: Payer: Self-pay | Admitting: Nurse Practitioner

## 2024-02-16 DIAGNOSIS — E876 Hypokalemia: Secondary | ICD-10-CM

## 2024-02-16 DIAGNOSIS — D649 Anemia, unspecified: Secondary | ICD-10-CM

## 2024-02-16 DIAGNOSIS — R7989 Other specified abnormal findings of blood chemistry: Secondary | ICD-10-CM

## 2024-02-16 LAB — BASIC METABOLIC PANEL WITH GFR
BUN/Creatinine Ratio: 10 (ref 9–23)
BUN: 8 mg/dL (ref 6–24)
CO2: 29 mmol/L (ref 20–29)
Calcium: 8.9 mg/dL (ref 8.7–10.2)
Chloride: 98 mmol/L (ref 96–106)
Creatinine, Ser: 0.83 mg/dL (ref 0.57–1.00)
Glucose: 91 mg/dL (ref 70–99)
Potassium: 2.9 mmol/L — ABNORMAL LOW (ref 3.5–5.2)
Sodium: 139 mmol/L (ref 134–144)
eGFR: 81 mL/min/1.73 (ref >59)

## 2024-02-16 LAB — CBC WITH DIFFERENTIAL/PLATELET
Basophils Absolute: 0 x10E3/uL (ref 0.0–0.2)
Basos: 0 %
EOS (ABSOLUTE): 0.2 x10E3/uL (ref 0.0–0.4)
Eos: 5 %
Hematocrit: 33.9 % — ABNORMAL LOW (ref 34.0–46.6)
Hemoglobin: 11 g/dL — ABNORMAL LOW (ref 11.1–15.9)
Immature Grans (Abs): 0 x10E3/uL (ref 0.0–0.1)
Immature Granulocytes: 0 %
Lymphocytes Absolute: 1.2 x10E3/uL (ref 0.7–3.1)
Lymphs: 27 %
MCH: 30.7 pg (ref 26.6–33.0)
MCHC: 32.4 g/dL (ref 31.5–35.7)
MCV: 95 fL (ref 79–97)
Monocytes Absolute: 0.4 x10E3/uL (ref 0.1–0.9)
Monocytes: 8 %
Neutrophils Absolute: 2.7 x10E3/uL (ref 1.4–7.0)
Neutrophils: 60 %
Platelets: 238 x10E3/uL (ref 150–450)
RBC: 3.58 x10E6/uL — ABNORMAL LOW (ref 3.77–5.28)
RDW: 13 % (ref 11.7–15.4)
WBC: 4.6 x10E3/uL (ref 3.4–10.8)

## 2024-02-16 MED ORDER — POTASSIUM CHLORIDE CRYS ER 20 MEQ PO TBCR: 20.0000 meq | EXTENDED_RELEASE_TABLET | Freq: Every day | ORAL | 0 refills | Status: DC

## 2024-02-16 NOTE — Progress Notes (Signed)
 Contacted via MyChart -- lab only visit in 1 week please  Good afternoon Tracey Perez, your labs have returned.  Potassium actually trended down more.  I have sent in supplement for you to start daily and take for one week.  We will recheck labs outpatient in one week for this.  CBC shows some trend up in hemoglobin and hematocrit but still a little low. We will recheck this then too.  Ensure an iron rich diet at home.  Any questions? Keep being amazing!!  Thank you for allowing me to participate in your care.  I appreciate you. Kindest regards, Gemma Ruan

## 2024-02-20 ENCOUNTER — Encounter: Payer: Self-pay | Admitting: Nurse Practitioner

## 2024-02-24 ENCOUNTER — Other Ambulatory Visit

## 2024-02-24 DIAGNOSIS — D649 Anemia, unspecified: Secondary | ICD-10-CM

## 2024-02-24 DIAGNOSIS — E876 Hypokalemia: Secondary | ICD-10-CM

## 2024-02-24 DIAGNOSIS — R7989 Other specified abnormal findings of blood chemistry: Secondary | ICD-10-CM

## 2024-02-24 DIAGNOSIS — J189 Pneumonia, unspecified organism: Secondary | ICD-10-CM

## 2024-02-24 DIAGNOSIS — J9601 Acute respiratory failure with hypoxia: Secondary | ICD-10-CM

## 2024-02-25 ENCOUNTER — Ambulatory Visit: Payer: Self-pay | Admitting: Nurse Practitioner

## 2024-02-25 LAB — FERRITIN: Ferritin: 125 ng/mL (ref 15–150)

## 2024-02-25 LAB — CBC WITH DIFFERENTIAL/PLATELET
Basophils Absolute: 0 x10E3/uL (ref 0.0–0.2)
Basos: 0 %
EOS (ABSOLUTE): 0.1 x10E3/uL (ref 0.0–0.4)
Eos: 3 %
Hematocrit: 38.1 % (ref 34.0–46.6)
Hemoglobin: 12.3 g/dL (ref 11.1–15.9)
Immature Grans (Abs): 0 x10E3/uL (ref 0.0–0.1)
Immature Granulocytes: 0 %
Lymphocytes Absolute: 1.2 x10E3/uL (ref 0.7–3.1)
Lymphs: 26 %
MCH: 30.3 pg (ref 26.6–33.0)
MCHC: 32.3 g/dL (ref 31.5–35.7)
MCV: 94 fL (ref 79–97)
Monocytes Absolute: 0.2 x10E3/uL (ref 0.1–0.9)
Monocytes: 3 %
Neutrophils Absolute: 3.2 x10E3/uL (ref 1.4–7.0)
Neutrophils: 68 %
Platelets: 274 x10E3/uL (ref 150–450)
RBC: 4.06 x10E6/uL (ref 3.77–5.28)
RDW: 13.1 % (ref 11.7–15.4)
WBC: 4.8 x10E3/uL (ref 3.4–10.8)

## 2024-02-25 LAB — IRON: Iron: 63 ug/dL (ref 27–159)

## 2024-02-25 LAB — POTASSIUM: Potassium: 4.1 mmol/L (ref 3.5–5.2)

## 2024-02-25 NOTE — Progress Notes (Signed)
 Contacted via MyChart  These labs look a lot better.  Continue all current medications.  Great news!!

## 2024-03-27 ENCOUNTER — Emergency Department

## 2024-03-27 ENCOUNTER — Other Ambulatory Visit: Payer: Self-pay

## 2024-03-27 ENCOUNTER — Encounter: Payer: Self-pay | Admitting: Oncology

## 2024-03-27 ENCOUNTER — Emergency Department
Admission: EM | Admit: 2024-03-27 | Discharge: 2024-03-27 | Disposition: A | Discharge status: Home / Self Care | Attending: Emergency Medicine | Admitting: Emergency Medicine

## 2024-03-27 DIAGNOSIS — J189 Pneumonia, unspecified organism: Secondary | ICD-10-CM

## 2024-03-27 DIAGNOSIS — R0902 Hypoxemia: Secondary | ICD-10-CM | POA: Diagnosis present

## 2024-03-27 DIAGNOSIS — J9601 Acute respiratory failure with hypoxia: Secondary | ICD-10-CM

## 2024-03-27 DIAGNOSIS — R051 Acute cough: Secondary | ICD-10-CM

## 2024-03-27 DIAGNOSIS — R0602 Shortness of breath: Secondary | ICD-10-CM

## 2024-03-27 HISTORY — DX: Pneumonia, unspecified organism: J18.9

## 2024-03-27 LAB — CBC
HCT: 35.2 % — ABNORMAL LOW (ref 36.0–46.0)
Hemoglobin: 11.6 g/dL — ABNORMAL LOW (ref 12.0–15.0)
MCH: 29.7 pg (ref 26.0–34.0)
MCHC: 33 g/dL (ref 30.0–36.0)
MCV: 90 fL (ref 80.0–100.0)
Platelets: 252 K/uL (ref 150–400)
RBC: 3.91 MIL/uL (ref 3.87–5.11)
RDW: 13.7 % (ref 11.5–15.5)
WBC: 8.8 K/uL (ref 4.0–10.5)
nRBC: 0 % (ref 0.0–0.2)

## 2024-03-27 LAB — BASIC METABOLIC PANEL WITH GFR
Anion gap: 9 (ref 5–15)
BUN: 10 mg/dL (ref 6–20)
CO2: 29 mmol/L (ref 22–32)
Calcium: 9.4 mg/dL (ref 8.9–10.3)
Chloride: 100 mmol/L (ref 98–111)
Creatinine, Ser: 0.81 mg/dL (ref 0.44–1.00)
GFR, Estimated: >60 mL/min (ref >60)
Glucose, Bld: 135 mg/dL — ABNORMAL HIGH (ref 70–99)
Potassium: 3.1 mmol/L — ABNORMAL LOW (ref 3.5–5.1)
Sodium: 137 mmol/L (ref 135–145)

## 2024-03-27 LAB — TROPONIN T, HIGH SENSITIVITY: Troponin T High Sensitivity: <15 ng/L (ref 0–19)

## 2024-03-27 LAB — LACTIC ACID, PLASMA: Lactic Acid, Venous: 1.1 mmol/L (ref 0.5–1.9)

## 2024-03-27 LAB — MAGNESIUM: Magnesium: 1.9 mg/dL (ref 1.7–2.4)

## 2024-03-27 LAB — RESP PANEL BY RT-PCR (RSV, FLU A&B, COVID)  RVPGX2
Influenza A by PCR: NEGATIVE
Influenza B by PCR: NEGATIVE
Resp Syncytial Virus by PCR: NEGATIVE
SARS Coronavirus 2 by RT PCR: NEGATIVE

## 2024-03-27 LAB — PHOSPHORUS: Phosphorus: 2.4 mg/dL — ABNORMAL LOW (ref 2.5–4.6)

## 2024-03-27 MED ORDER — POTASSIUM CHLORIDE CRYS ER 20 MEQ PO TBCR
40.0000 meq | EXTENDED_RELEASE_TABLET | Freq: Once | ORAL | Status: AC
  Administered 2024-03-27: 40 meq via ORAL
  Filled 2024-03-27: qty 2

## 2024-03-27 MED ORDER — AMOXICILLIN-POT CLAVULANATE 875-125 MG PO TABS
1.0000 | ORAL_TABLET | Freq: Two times a day (BID) | ORAL | 0 refills | Status: DC
  Filled 2024-03-27: qty 8, 4d supply, fill #0

## 2024-03-27 MED ORDER — SODIUM CHLORIDE 0.9 % IV SOLN
100.0000 mg | Freq: Once | INTRAVENOUS | Status: AC
  Administered 2024-03-27: 100 mg via INTRAVENOUS
  Filled 2024-03-27: qty 100

## 2024-03-27 MED ORDER — AZITHROMYCIN 250 MG PO TABS
ORAL_TABLET | ORAL | 0 refills | Status: DC
  Filled 2024-03-27: qty 6, 5d supply, fill #0

## 2024-03-27 MED ORDER — FLUCONAZOLE 100 MG PO TABS
100.0000 mg | ORAL_TABLET | Freq: Every day | ORAL | 0 refills | Status: AC
  Filled 2024-03-27: qty 3, 3d supply, fill #0

## 2024-03-27 MED ORDER — SODIUM CHLORIDE 0.9 % IV SOLN
1.0000 g | Freq: Once | INTRAVENOUS | Status: AC
  Administered 2024-03-27: 1 g via INTRAVENOUS
  Filled 2024-03-27: qty 10

## 2024-03-27 NOTE — ED Provider Notes (Addendum)
 [facility] Provider Note    Event Date/Time   First MD Initiated Contact with Patient 03/27/24 0815     (approximate)   History   Cough and PNA rule out   HPI  Tracey Perez is a 58 y.o. female with history of chronic pain on oxycodone , history of PE on Eliquis , hypertension, presenting with shortness of breath and cough.  States symptoms feel very similar to when she had her pneumonia in September.  Denies history of COPD or asthma, no smoking history, no history of CHF or cardiac issues.  No leg swelling or recent travel, no history of malignancies.  States that she has been compliant with her Eliquis .  No fever at home.  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  On independent chart review, she was admitted in September for pneumonia, hypoxia.  Was also diagnosed with rhinovirus.     Physical Exam   Triage Vital Signs: ED Triage Vitals  Encounter Vitals Group     BP 03/27/24 0809 137/71     Girls Systolic BP Percentile --      Girls Diastolic BP Percentile --      Boys Systolic BP Percentile --      Boys Diastolic BP Percentile --      Pulse Rate 03/27/24 0809 88     Resp 03/27/24 0809 20     Temp 03/27/24 0809 98.9 F (37.2 C)     Temp Source 03/27/24 0809 Oral     SpO2 03/27/24 0809 (!) 88 %     Weight 03/27/24 0812 120 lb (54.4 kg)     Height 03/27/24 0812 5' 4 (1.626 m)     Head Circumference --      Peak Flow --      Pain Score 03/27/24 0810 7     Pain Loc --      Pain Education --      Exclude from Growth Chart --     Most recent vital signs: Vitals:   03/27/24 1051 03/27/24 1100  BP:  132/81  Pulse: 77 75  Resp: 14 (!) 23  Temp:    SpO2: 97% 92%     General: Awake, no distress.  CV:  Good peripheral perfusion.  Resp:  Normal effort.  No tachypnea or respiratory distress, no wheezing, heard slight crackles on the right with the  cough Abd:  No distention.  Soft nontender Other:  No lower extremity edema   ED Results / Procedures / Treatments   Labs (all labs ordered are listed, but only abnormal results are displayed) Labs Reviewed  BASIC METABOLIC PANEL WITH GFR - Abnormal; Notable for the following components:      Result Value   Potassium 3.1 (*)    Glucose, Bld 135 (*)    All other components within normal limits  CBC - Abnormal; Notable for the following components:   Hemoglobin 11.6 (*)    HCT 35.2 (*)    All other components within normal limits  PHOSPHORUS - Abnormal; Notable for the following components:   Phosphorus 2.4 (*)    All other components within normal limits  RESP PANEL BY RT-PCR (RSV, FLU A&B, COVID)  [id number]  LACTIC ACID, PLASMA  MAGNESIUM  TROPONIN T, HIGH SENSITIVITY     EKG  EKG shows, sinus rhythm, rate 82, normal QS, normal QTc, no obvious ischemic ST  elevation, T wave flattening to lateral leads, not significantly changed compared to prior   RADIOLOGY On my independent interpretation, x-ray shows right-sided opacity   PROCEDURES:  Critical Care performed: Yes, see critical care procedure note(s)  .Critical Care  Performed by: Waymond Lorelle Cummins, MD Authorized by: Waymond Lorelle Cummins, MD   Critical care provider statement:    Critical care time (minutes):  40   Critical care was necessary to treat or prevent imminent or life-threatening deterioration of the following conditions:  Respiratory failure   Critical care was time spent personally by me on the following activities:  Development of treatment plan with patient or surrogate, discussions with consultants, evaluation of patient's response to treatment, examination of patient, ordering and review of laboratory studies, ordering and review of radiographic studies, ordering and performing treatments and interventions, pulse oximetry, re-evaluation of patient's condition and review of old charts    MEDICATIONS ORDERED IN  ED: Medications  doxycycline  (VIBRAMYCIN ) 100 mg in sodium chloride  0.9 % 250 mL IVPB (100 mg Intravenous New Bag/Given 03/27/24 0949)  cefTRIAXone  (ROCEPHIN ) 1 g in sodium chloride  0.9 % 100 mL IVPB (0 g Intravenous Stopped 03/27/24 0944)  potassium chloride  SA (KLOR-CON  M) CR tablet 40 mEq (40 mEq Oral Given 03/27/24 0947)     IMPRESSION / MDM / ASSESSMENT AND PLAN / ED COURSE  I reviewed the triage vital signs and the nursing notes.                              Differential diagnosis includes, but is not limited to, viral illness, pneumonia, bronchitis, no wheezing to suggest asthma or COPD, no leg swelling or evidence of volume overload at this time to suggest CHF, did consider ACS but patient has no chest pain, did consider PE but patient states that she has been compliant with her medications.  Symptoms seem more consistent with infection at this time.  Will get labs, EKG, chest x-ray, viral swab.  She was found to be satting 88% on room air, placed on 2 L nasal cannula.    Patient's presentation is most consistent with acute presentation with potential threat to life or bodily function.  Independent interpretation of labs and imaging below.  Chest x-ray consistent with pneumonia, will start her on IV ceftriaxone  and doxycycline .  Consult the hospitalist will admit the patient.  She is admitted.  Patient now stating that she does not want to stay for admission, states that she has dogs at home and has no close family or friends who is able to care for the dog.  Understands the risk of leaving including decompensation and death.  She was titrated off her O2, now satting in the 90s on room air.  Hospitalist went to evaluate the patient, okay on her and to discharge, she will put in orders for antibiotics and outpatient follow-up.  Patient does not want to stay and understands risk and since her oxygen saturation is improving compared to prior, I agree with discharge, she is instructed to come  back if she gets worse and to follow-up primary care this week for reassessment.   Clinical Course as of 03/27/24 1122  Sun Mar 27, 2024  0845 DG Chest 2 View IMPRESSION: 1. New patchy bilateral opacities, greatest in the right upper lung and left mid lung, suspicious for pneumonia.   [TT]  0908 Independent review of labs, troponins not elevated, no leukocytosis, potassium is mildly low, will  replete, will add on a mag Phos, creatinine is normal, lactic is not elevated. [TT]    Clinical Course User Index [TT] Waymond Lorelle Cummins, MD     FINAL CLINICAL IMPRESSION(S) / ED DIAGNOSES   Final diagnoses:  Acute cough  Community acquired pneumonia of right lung, unspecified part of lung  Shortness of breath  Acute hypoxemic respiratory failure (HCC)     Rx / DC Orders   ED Discharge Orders     None        Note:  This document was prepared using Dragon voice recognition software and may include unintentional dictation errors.    Waymond Lorelle Cummins, MD 03/27/24 9064    Waymond Lorelle Cummins, MD 03/27/24 [phone]

## 2024-03-27 NOTE — ED Triage Notes (Signed)
 Pt to ED for possible PNA. States SPO2 wqas 86%-89% last night. Had PNA in Sept this year. Has had cough and HA since Wednesday. States green sputum. Pt is 88% on RA. Placed on 2L. Pt takes oxycodone  30mg  6 times/day for chronic pain. Took this AM. Did not take antipyretic. Respirations are unlabored.

## 2024-03-27 NOTE — Consult Note (Incomplete)
 consult   Tracey Perez  female DOB: 01-Mar-1966  FMW:[id number]  PCP: Valerio Melanie DASEN, NP  Admit date: 03/27/2024 Discharge date: ***  Admitted From: *** Disposition:  {disposition:18248} Home Health: {yes/no:20286} CODE STATUS: {Palliative Code status:23503}  Discharge Instructions     Discharge instructions   Complete by: As directed    Since you do not want to be admitted, you are discharged to finish 4 more days of oral antibiotics Augmentin  and azithromycin .  I prescribed you 3 doses of Fluconazole  to prevent yeast infection.  If symptoms become worse, please return to the ED. [facility] Course:  For full details, please see H&P, progress notes, consult notes and ancillary notes.  Briefly,  ***  Unless noted above, medications under STOP list are ones pt was not taking PTA.  Discharge Diagnoses:  Principal Problem:   Hypoxia     Discharge Instructions:  Allergies as of 03/27/2024       Reactions   Morphine And Codeine Hives   Nsaids Other (See Comments)   Affects platelet count   Sulfa Antibiotics Shortness Of Breath   Tape Other (See Comments)   bruises skin   Verapamil Swelling   Lisinopril  Other (See Comments)   Burning throat, metallic taste   Other Itching   Pt states she is allergic to the plastic that IV bags are made of. Reaction causes itching relieved by Benadryl .    Acetazolamide Other (See Comments)   Acid reflux   Seroquel [quetiapine Fumarate] Other (See Comments)   Muscle cramps        Medication List     STOP taking these medications    nystatin  100000 UNIT/ML suspension Commonly known as: MYCOSTATIN    potassium chloride  SA 20 MEQ tablet Commonly known as: KLOR-CON  M       TAKE these medications    albuterol  108 (90 Base) MCG/ACT inhaler Commonly known as: VENTOLIN  HFA Inhale 2 puffs into the lungs every 6 (six) hours as needed for wheezing or shortness of breath.   amoxicillin -clavulanate 875-125  MG tablet Commonly known as: AUGMENTIN  Take 1 tablet by mouth 2 (two) times daily for 4 days. Start taking on: March 28, 2024   azithromycin  250 MG tablet Commonly known as: Zithromax  Z-Pak Take 2 tablets (500 mg) on  Day 1,  followed by 1 tablet (250 mg) once daily on Days 2 through 5.   benzonatate  100 MG capsule Commonly known as: TESSALON  Take 1 capsule (100 mg total) by mouth 3 (three) times daily as needed for cough.   Blood Pressure Kit Kit 1 kit by Does not apply route 2 (two) times daily.   diphenhydrAMINE  25 mg capsule Commonly known as: BENADRYL  Take 50 mg by mouth 4 (four) times daily as needed for allergies or sleep.   Eliquis  5 MG Tabs tablet Generic drug: apixaban  TAKE 1 TABLET(5 MG) BY MOUTH TWICE DAILY   fluconazole  100 MG tablet Commonly known as: Diflucan  Take 1 tablet (100 mg total) by mouth daily for 3 days.   gabapentin  800 MG tablet Commonly known as: NEURONTIN  Take 800 mg by mouth 4 (four) times daily.   hydrALAZINE  25 MG tablet Commonly known as: APRESOLINE  TAKE 1 TABLET BY MOUTH DAILY AS NEEDED FOR BLOOD PRESSURE OVER 130/80   levothyroxine  50 MCG tablet Commonly known as: SYNTHROID  Take 1 tablet (50 mcg total) by mouth daily.   mirtazapine  30 MG tablet Commonly known as: REMERON  Take 30 mg by mouth  at bedtime.   naloxone 2 MG/2ML injection Commonly known as: NARCAN Inject 0.4 mg into the muscle as needed.   oxycodone  30 MG immediate release tablet Commonly known as: ROXICODONE  Take 1 tablet by mouth every 4 (four) hours as needed.   venlafaxine  XR 150 MG 24 hr capsule Commonly known as: EFFEXOR -XR Take 300 mg by mouth in the morning.   zolpidem  10 MG tablet Commonly known as: AMBIEN  Take 10 mg by mouth at bedtime.         Follow-up Information     Valerio Moris T, NP Follow up in 1 week(s).   Specialty: Nurse Practitioner Contact information: [address] [place] [address] [phone]                  Allergies  Allergen Reactions   Morphine And Codeine Hives   Nsaids Other (See Comments)    Affects platelet count   Sulfa Antibiotics Shortness Of Breath   Tape Other (See Comments)    bruises skin   Verapamil Swelling   Lisinopril  Other (See Comments)    Burning throat, metallic taste   Other Itching    Pt states she is allergic to the plastic that IV bags are made of. Reaction causes itching relieved by Benadryl .    Acetazolamide Other (See Comments)    Acid reflux    Seroquel [Quetiapine Fumarate] Other (See Comments)    Muscle cramps     The results of significant diagnostics from this hospitalization (including imaging, microbiology, ancillary and laboratory) are listed below for reference.   Consultations:   Procedures/Studies: DG Chest 2 View Result Date: 03/27/2024 EXAM: 2 VIEW(S) XRAY OF THE CHEST 03/27/2024 08:32:00 AM COMPARISON: CT chest 01/16/2024. CLINICAL HISTORY: PNA rule out. FINDINGS: LUNGS AND PLEURA: Interval improvement bilaterally in the lower lung zone patchy opacities noted on 01/16/2024 . New patchy bilateral opacities, greatest in the right upper lung and left mid lung. No pleural effusion. No pneumothorax. HEART AND MEDIASTINUM: No acute abnormality of the cardiac and mediastinal silhouettes. BONES AND SOFT TISSUES: No acute osseous abnormality. IMPRESSION: 1. New patchy bilateral opacities, greatest in the right upper lung and left mid lung, suspicious for pneumonia. Electronically signed by: Waddell Calk MD 03/27/2024 08:43 AM EST RP Workstation: [id number]      Labs: BNP (last 3 results) Recent Labs    01/16/24 0959  BNP 41.5   Basic Metabolic Panel: Recent Labs  Lab 03/27/24 0814  NA 137  K 3.1*  CL 100  CO2 29  GLUCOSE 135*  BUN 10  CREATININE 0.81  CALCIUM 9.4  MG 1.9  PHOS 2.4*   Liver Function Tests: No results for input(s): AST, ALT, ALKPHOS, BILITOT, PROT, ALBUMIN in the last 168 hours. No results for  input(s): LIPASE, AMYLASE in the last 168 hours. No results for input(s): AMMONIA in the last 168 hours. CBC: Recent Labs  Lab 03/27/24 0814  WBC 8.8  HGB 11.6*  HCT 35.2*  MCV 90.0  PLT 252   Cardiac Enzymes: No results for input(s): CKTOTAL, CKMB, CKMBINDEX, TROPONINI in the last 168 hours. BNP: Invalid input(s): POCBNP CBG: No results for input(s): GLUCAP in the last 168 hours. D-Dimer No results for input(s): DDIMER in the last 72 hours. Hgb A1c No results for input(s): HGBA1C in the last 72 hours. Lipid Profile No results for input(s): CHOL, HDL, LDLCALC, TRIG, CHOLHDL, LDLDIRECT in the last 72 hours. Thyroid  function studies No results for input(s): TSH, T4TOTAL, T3FREE, THYROIDAB in the  last 72 hours.  Invalid input(s): FREET3 Anemia work up No results for input(s): VITAMINB12, FOLATE, FERRITIN, TIBC, IRON, RETICCTPCT in the last 72 hours. Urinalysis No results found for: COLORURINE, APPEARANCEUR, LABSPEC, PHURINE, GLUCOSEU, HGBUR, BILIRUBINUR, KETONESUR, PROTEINUR, UROBILINOGEN, NITRITE, LEUKOCYTESUR Sepsis Labs Recent Labs  Lab 03/27/24 0814  WBC 8.8   Microbiology Recent Results (from the past 240 hours)  Resp panel by RT-PCR (RSV, Flu A&B, Covid) Anterior Nasal Swab     Status: None   Collection Time: 03/27/24  8:15 AM   Specimen: Anterior Nasal Swab  Result Value Ref Range Status   SARS Coronavirus 2 by RT PCR NEGATIVE NEGATIVE Final    Comment: (NOTE) SARS-CoV-2 target nucleic acids are NOT DETECTED.  The SARS-CoV-2 RNA is generally detectable in upper respiratory specimens during the acute phase of infection. The lowest concentration of SARS-CoV-2 viral copies this assay can detect is 138 copies/mL. A negative result does not preclude SARS-Cov-2 infection and should not be used as the sole basis for treatment or other patient management decisions. A negative result may  occur with  improper specimen collection/handling, submission of specimen other than nasopharyngeal swab, presence of viral mutation(s) within the areas targeted by this assay, and inadequate number of viral copies(<138 copies/mL). A negative result must be combined with clinical observations, patient history, and epidemiological information. The expected result is Negative.  Fact Sheet for Patients:  bloggercourse.com  Fact Sheet for Healthcare Providers:  seriousbroker.it  This test is no t yet approved or cleared by the [place]  FDA and  has been authorized for detection and/or diagnosis of SARS-CoV-2 by FDA under an Emergency Use Authorization (EUA). This EUA will remain  in effect (meaning this test can be used) for the duration of the COVID-19 declaration under Section 564(b)(1) of the Act, 21 U.S.C.section 360bbb-3(b)(1), unless the authorization is terminated  or revoked sooner.       Influenza A by PCR NEGATIVE NEGATIVE Final   Influenza B by PCR NEGATIVE NEGATIVE Final    Comment: (NOTE) The Xpert Xpress SARS-CoV-2/FLU/RSV plus assay is intended as an aid in the diagnosis of influenza from Nasopharyngeal swab specimens and should not be used as a sole basis for treatment. Nasal washings and aspirates are unacceptable for Xpert Xpress SARS-CoV-2/FLU/RSV testing.  Fact Sheet for Patients: bloggercourse.com  Fact Sheet for Healthcare Providers: seriousbroker.it  This test is not yet approved or cleared by the [place]  FDA and has been authorized for detection and/or diagnosis of SARS-CoV-2 by FDA under an Emergency Use Authorization (EUA). This EUA will remain in effect (meaning this test can be used) for the duration of the COVID-19 declaration under Section 564(b)(1) of the Act, 21 U.S.C. section 360bbb-3(b)(1), unless the authorization is terminated  or revoked.     Resp Syncytial Virus by PCR NEGATIVE NEGATIVE Final    Comment: (NOTE) Fact Sheet for Patients: bloggercourse.com  Fact Sheet for Healthcare Providers: seriousbroker.it  This test is not yet approved or cleared by the [place]  FDA and has been authorized for detection and/or diagnosis of SARS-CoV-2 by FDA under an Emergency Use Authorization (EUA). This EUA will remain in effect (meaning this test can be used) for the duration of the COVID-19 declaration under Section 564(b)(1) of the Act, 21 U.S.C. section 360bbb-3(b)(1), unless the authorization is terminated or revoked.  Performed at [facility], [address] [place], [place] [address]      Total time spend on discharging this patient, including the last patient exam, discussing  the hospital stay, instructions for ongoing care as it relates to all pertinent caregivers, as well as preparing the medical discharge records, prescriptions, and/or referrals as applicable, is *** minutes.    Ellouise Haber, MD  [facility] 03/27/2024, 12:37 PM

## 2024-03-28 ENCOUNTER — Encounter: Payer: Self-pay | Admitting: Nurse Practitioner

## 2024-03-29 ENCOUNTER — Encounter: Payer: Self-pay | Admitting: Nurse Practitioner

## 2024-03-31 NOTE — Patient Instructions (Signed)
 Be Involved in Caring For Your Health:  Taking Medications When medications are taken as directed, they can greatly improve your health. But if they are not taken as prescribed, they may not work. In some cases, not taking them correctly can be harmful. To help ensure your treatment remains effective and safe, understand your medications and how to take them. Bring your medications to each visit for review by your provider.  Your lab results, notes, and after visit summary will be available on My Chart. We strongly encourage you to use this feature. If lab results are abnormal the clinic will contact you with the appropriate steps. If the clinic does not contact you assume the results are satisfactory. You can always view your results on My Chart. If you have questions regarding your health or results, please contact the clinic during office hours. You can also ask questions on My Chart.  We at [facility] are grateful that you chose [place] to provide your care. We strive to provide evidence-based and compassionate care and are always looking for feedback. If you get a survey from the clinic please complete this so we can hear your opinions.  Community-Acquired Pneumonia, Adult Pneumonia is an infection of the lungs. It causes irritation and swelling in the airways of the lungs. Mucus and fluid may also build up inside the airways. This may cause coughing and trouble breathing. One type of pneumonia can happen while you are in a hospital. A different type can happen when you are not in a hospital (community-acquired pneumonia). What are the causes?  This condition is caused by germs (viruses, bacteria, or fungi). Some types of germs can spread from person to person. Pneumonia is not thought to spread from person to person. What increases the risk? You have a long-term (chronic) disease, such as: Disease of the lungs. This may be chronic obstructive pulmonary disease (COPD) or asthma. Heart  failure. Cystic fibrosis. Diabetes. Kidney disease. Sickle cell disease. HIV. You have other health problems, such as: Your body's defense system (immune system) is weak. A condition that may cause you to breathe in fluids from your mouth and nose. You had your spleen taken out. You do not take good care of your teeth and mouth (poor dental hygiene). You use or have used tobacco products. You go where the germs that cause this illness are common. You are older than 59 years of age. What are the signs or symptoms? A cough. A fever. Sweating or chills. Chest pain, often when you breathe deeply or cough. Breathing problems, such as: Fast breathing. Trouble breathing. Shortness of breath. Feeling tired (fatigued). Muscle aches. How is this treated? Treatment for this condition depends on many things, such as: The cause of your illness. Your medicines. Your other health problems. Most adults can be treated at home. Sometimes, treatment must happen in a hospital. Treatment may include medicines to kill germs. Medicines may depend on which germ caused your illness. Very bad pneumonia is rare. If you get it, you may: Have a machine to help you breathe. Have fluid taken away from around your lungs. Follow these instructions at home: Medicines Take over-the-counter and prescription medicines only as told by your doctor. Take cough medicine only if you are losing sleep. Cough medicine can keep your body from taking mucus away from your lungs. If you were prescribed antibiotics, take them as told by your doctor. Do not stop taking them even if you start to feel better. Lifestyle  Do not smoke or use any products that contain nicotine or tobacco. If you need help quitting, ask your doctor. Do not drink alcohol. Eat a healthy diet. This includes a lot of vegetables, fruits, whole grains, low-fat dairy products, and low-fat (lean) protein. General instructions  Rest a lot. Sleep  for at least 8 hours each night. Sleep with your head and neck raised. Put a few pillows under your head or sleep in a reclining chair. Return to your normal activities as told by your doctor. Ask your doctor what activities are safe for you. Drink enough fluid to keep your pee (urine) pale yellow. If your throat is sore, gargle with a mixture of salt and water 3-4 times a day or as needed. To make salt water, completely dissolve -1 tsp (3-6 g) of salt in 1 cup (237 mL) of warm water. Keep all follow-up visits. How is this prevented? Getting the pneumonia shot (vaccine). These shots have different types and schedules. Ask your doctor what works best for you. Think about getting this shot if: You are older than 59 years of age. You are 25-63 years of age and: You are being treated for cancer. You have long-term lung disease. You have other problems that affect your body's defense system. Ask your doctor if you have one of these. Getting your flu shot every year. Ask your doctor which type of shot is best for you. Going to the dentist as often as told. Washing your hands often with soap and water for at least 20 seconds. If you cannot use soap and water, use hand sanitizer. Contact a doctor if: You have a fever. You lose sleep because your cough medicine does not help. Get help right away if: You are short of breath and this gets worse. You have more chest pain. Your sickness gets worse. This is very serious if: You are an older adult. Your body's defense system is weak. You cough up blood. These symptoms may be an emergency. Get help right away. Call 911. Do not wait to see if the symptoms will go away. Do not drive yourself to the hospital. Summary Pneumonia is an infection of the lungs. Community-acquired pneumonia affects people who have not been in the hospital. Certain germs can cause this infection. This condition may be treated with medicines that kill germs. For very bad  pneumonia, you may need a hospital stay and treatment to help with breathing. This information is not intended to replace advice given to you by your health care provider. Make sure you discuss any questions you have with your health care provider. Document Revised: 06/05/2021 Document Reviewed: 06/05/2021 [employer]vier Patient Education  2024 [employer].

## 2024-04-01 ENCOUNTER — Encounter: Payer: Self-pay | Admitting: Nurse Practitioner

## 2024-04-01 ENCOUNTER — Ambulatory Visit: Admitting: Nurse Practitioner

## 2024-04-01 DIAGNOSIS — J189 Pneumonia, unspecified organism: Secondary | ICD-10-CM

## 2024-04-01 MED ORDER — BENZONATATE 100 MG PO CAPS: 100.0000 mg | ORAL_CAPSULE | Freq: Two times a day (BID) | ORAL | 0 refills | Status: DC | PRN

## 2024-04-01 MED ORDER — IPRATROPIUM-ALBUTEROL 0.5-2.5 (3) MG/3ML IN SOLN
3.0000 mL | Freq: Once | RESPIRATORY_TRACT | Status: AC
  Administered 2024-04-01: 3 mL via RESPIRATORY_TRACT

## 2024-04-01 MED ORDER — AMOXICILLIN-POT CLAVULANATE 875-125 MG PO TABS: 1.0000 | ORAL_TABLET | Freq: Two times a day (BID) | ORAL | 0 refills | Status: DC

## 2024-04-01 NOTE — Progress Notes (Signed)
 BP 138/88   Pulse 99   Temp 99.8 F (37.7 C) (Oral)   Ht 5' 3.2 (1.605 m)   Wt 124 lb 3.2 oz (56.3 kg)   SpO2 100%   BMI 21.86 kg/m    Subjective:    Patient ID: Tracey Perez, female    DOB: [birth year], 58 y.o.   MRN: [id number]  HPI: Tracey Perez is a 58 y.o. female  Chief Complaint  Patient presents with   ER Follow Up    Patient diagnosed with pneumonia 03/27/24   ER FOLLOW UP Seen in ER on 03/27/24 and diagnosed with pneumonia. Was hospitalized on 01/16/24 for similar and had improved, gone back to work. Labs in ER noting phosphorous slightly low, H/H a little on low side, potassium low at 3.1. Imaging noting new patchy bilateral opacitie, greatest in right upper and left mid lobe. Was sent home with Augmentin  and Zpack + Diflucan . Respiratory panel was negative. She finished Augmentin  and Zpack today.  She reports she started having symptoms again beginning of last week, but on Wednesday cough got worse. Worked on Thursday and did Covid swab which was negative. On Saturday her O2 level was reducing and she felt light headed. Then on Sunday she felt worse. ER wanted to admit her, but she refused as no one to feed her dogs. Does not feel any better at present, is up coughing all night long. Time since discharge: 5 days Hospital/facility: [facility] Diagnosis: Pneumonia Procedures/tests: as above Consultants: none New medications: as above Discharge instructions:  follow-up with PCP Status: fluctuating     Latest Ref Rng & Units 03/27/2024    8:14 AM 02/24/2024    8:59 AM 02/15/2024    2:32 PM  CBC  WBC 4.0 - 10.5 K/uL 8.8  4.8  4.6   Hemoglobin 12.0 - 15.0 g/dL 88.3  87.6  88.9   Hematocrit 36.0 - 46.0 % 35.2  38.1  33.9   Platelets 150 - 400 K/uL 252  274  238        Latest Ref Rng & Units 03/27/2024    8:14 AM 02/24/2024    8:59 AM 02/15/2024    2:32 PM  BMP  Glucose 70 - 99 mg/dL 864   91   BUN 6 - 20 mg/dL 10   8   Creatinine 9.55 - 1.00 mg/dL 9.18   9.16    BUN/Creat Ratio 9 - 23   10   Sodium 135 - 145 mmol/L 137   139   Potassium 3.5 - 5.1 mmol/L 3.1  4.1  2.9   Chloride 98 - 111 mmol/L 100   98   CO2 22 - 32 mmol/L 29   29   Calcium 8.9 - 10.3 mg/dL 9.4   8.9     Relevant past medical, surgical, family and social history reviewed and updated as indicated. Interim medical history since our last visit reviewed. Allergies and medications reviewed and updated.  Review of Systems  Constitutional:  Positive for fatigue. Negative for activity change, appetite change, chills and fever.  HENT: Negative.    Respiratory:  Positive for cough, chest tightness, shortness of breath and wheezing.   Cardiovascular:  Negative for chest pain, palpitations and leg swelling.  Gastrointestinal: Negative.   Neurological: Negative.   Psychiatric/Behavioral: Negative.      Per HPI unless specifically indicated above     Objective:    BP 138/88   Pulse 99   Temp 99.8 F (37.7  C) (Oral)   Ht 5' 3.2 (1.605 m)   Wt 124 lb 3.2 oz (56.3 kg)   SpO2 100%   BMI 21.86 kg/m   Wt Readings from Last 3 Encounters:  04/01/24 124 lb 3.2 oz (56.3 kg)  03/27/24 120 lb (54.4 kg)  02/15/24 125 lb 3.2 oz (56.8 kg)    Physical Exam Vitals and nursing note reviewed.  Constitutional:      General: She is awake. She is not in acute distress.    Appearance: She is well-developed and well-groomed. She is ill-appearing. She is not toxic-appearing.  HENT:     Head: Normocephalic.     Right Ear: Hearing, tympanic membrane, ear canal and external ear normal.     Left Ear: Hearing, tympanic membrane, ear canal and external ear normal.     Nose: No rhinorrhea.     Right Sinus: No maxillary sinus tenderness or frontal sinus tenderness.     Left Sinus: No maxillary sinus tenderness or frontal sinus tenderness.     Mouth/Throat:     Mouth: Mucous membranes are moist.     Pharynx: No pharyngeal swelling or oropharyngeal exudate.  Eyes:     General: Lids are normal.         Right eye: No discharge.        Left eye: No discharge.     Conjunctiva/sclera: Conjunctivae normal.     Pupils: Pupils are equal, round, and reactive to light.  Neck:     Thyroid : No thyromegaly.     Vascular: No carotid bruit.  Cardiovascular:     Rate and Rhythm: Normal rate and regular rhythm.     Heart sounds: Normal heart sounds. No murmur heard.    No gallop.  Pulmonary:     Effort: Pulmonary effort is normal. No accessory muscle usage or respiratory distress.     Breath sounds: Wheezing and rales present. No decreased breath sounds.     Comments: Expiratory wheezes noted throughout. Rales to right upper lobe and both bases. Productive cough present. Abdominal:     General: Bowel sounds are normal.     Palpations: Abdomen is soft. There is no hepatomegaly or splenomegaly.  Musculoskeletal:     Cervical back: Normal range of motion and neck supple.     Right lower leg: No edema.     Left lower leg: No edema.  Lymphadenopathy:     Head:     Right side of head: No submental, submandibular, tonsillar, preauricular or posterior auricular adenopathy.     Left side of head: No submental, submandibular, tonsillar, preauricular or posterior auricular adenopathy.     Cervical: No cervical adenopathy.  Skin:    General: Skin is warm and dry.  Neurological:     Mental Status: She is alert and oriented to person, place, and time.  Psychiatric:        Attention and Perception: Attention normal.        Mood and Affect: Mood normal.        Speech: Speech normal.        Behavior: Behavior normal. Behavior is cooperative.        Thought Content: Thought content normal.     Results for orders placed or performed during the hospital encounter of 03/27/24  Basic metabolic panel   Collection Time: 03/27/24  8:14 AM  Result Value Ref Range   Sodium 137 135 - 145 mmol/L   Potassium 3.1 (L) 3.5 - 5.1 mmol/L   Chloride  100 98 - 111 mmol/L   CO2 29 22 - 32 mmol/L   Glucose, Bld 135  (H) 70 - 99 mg/dL   BUN 10 6 - 20 mg/dL   Creatinine, Ser 9.18 0.44 - 1.00 mg/dL   Calcium 9.4 8.9 - 89.6 mg/dL   GFR, Estimated >39 >39 mL/min   Anion gap 9 5 - 15  CBC   Collection Time: 03/27/24  8:14 AM  Result Value Ref Range   WBC 8.8 4.0 - 10.5 K/uL   RBC 3.91 3.87 - 5.11 MIL/uL   Hemoglobin 11.6 (L) 12.0 - 15.0 g/dL   HCT 64.7 (L) 63.9 - 53.9 %   MCV 90.0 80.0 - 100.0 fL   MCH 29.7 26.0 - 34.0 pg   MCHC 33.0 30.0 - 36.0 g/dL   RDW 86.2 88.4 - 84.4 %   Platelets 252 150 - 400 K/uL   nRBC 0.0 0.0 - 0.2 %  Magnesium   Collection Time: 03/27/24  8:14 AM  Result Value Ref Range   Magnesium 1.9 1.7 - 2.4 mg/dL  Phosphorus   Collection Time: 03/27/24  8:14 AM  Result Value Ref Range   Phosphorus 2.4 (L) 2.5 - 4.6 mg/dL  Troponin T, High Sensitivity   Collection Time: 03/27/24  8:14 AM  Result Value Ref Range   Troponin T High Sensitivity <15 0 - 19 ng/L  Resp panel by RT-PCR (RSV, Flu A&B, Covid) Anterior Nasal Swab   Collection Time: 03/27/24  8:15 AM   Specimen: Anterior Nasal Swab  Result Value Ref Range   SARS Coronavirus 2 by RT PCR NEGATIVE NEGATIVE   Influenza A by PCR NEGATIVE NEGATIVE   Influenza B by PCR NEGATIVE NEGATIVE   Resp Syncytial Virus by PCR NEGATIVE NEGATIVE  Lactic acid, plasma   Collection Time: 03/27/24  8:15 AM  Result Value Ref Range   Lactic Acid, Venous 1.1 0.5 - 1.9 mmol/L      Assessment & Plan:   Problem List Items Addressed This Visit       Respiratory   CAP (community acquired pneumonia) - Primary   Acute, diagnosed on 03/27/24. Recently had similar in September 2025. Will plan on repeat CXR in 6 weeks, around January 18th. Continue Tessalon  as needed, refills sent in. Will extend Augmentin  by 5 more days. Considered Levofloxacin, but had muscle cramps with Seroquel and concerned for similar with Levofloxacin.  Prednisone  40 MG daily for 5 days sent in. Recommend OTC Mucinex  and continue Albuterol  inhaler as needed. Duoneb in office  today, which offered some benefit.  If any worsening symptoms to alert PCP ASAP or go immediately to ER.  She was given strict ER precautions and is aware of when to return. She will continue to monitor O2 sats at home regularly.      Relevant Medications   benzonatate  (TESSALON ) 100 MG capsule   amoxicillin -clavulanate (AUGMENTIN ) 875-125 MG tablet   Other Relevant Orders   CBC with Differential/Platelet   Comprehensive metabolic panel with GFR   Phosphorus     Follow up plan: Return in about 1 week (around 04/08/2024) for Pneumonia -- cancel the visit on December 23rd.

## 2024-04-01 NOTE — Assessment & Plan Note (Signed)
 Acute, diagnosed on 03/27/24. Recently had similar in September 2025. Will plan on repeat CXR in 6 weeks, around January 18th. Continue Tessalon  as needed, refills sent in. Will extend Augmentin  by 5 more days. Considered Levofloxacin, but had muscle cramps with Seroquel and concerned for similar with Levofloxacin.  Prednisone  40 MG daily for 5 days sent in. Recommend OTC Mucinex  and continue Albuterol  inhaler as needed. Duoneb in office today, which offered some benefit.  If any worsening symptoms to alert PCP ASAP or go immediately to ER.  She was given strict ER precautions and is aware of when to return. She will continue to monitor O2 sats at home regularly.

## 2024-04-02 ENCOUNTER — Ambulatory Visit: Payer: Self-pay | Admitting: Nurse Practitioner

## 2024-04-02 LAB — CBC WITH DIFFERENTIAL/PLATELET
Basophils Absolute: 0 x10E3/uL (ref 0.0–0.2)
Basos: 1 %
EOS (ABSOLUTE): 0.2 x10E3/uL (ref 0.0–0.4)
Eos: 3 %
Hematocrit: 42.4 % (ref 34.0–46.6)
Hemoglobin: 13.9 g/dL (ref 11.1–15.9)
Immature Grans (Abs): 0 x10E3/uL (ref 0.0–0.1)
Immature Granulocytes: 0 %
Lymphocytes Absolute: 1.6 x10E3/uL (ref 0.7–3.1)
Lymphs: 24 %
MCH: 29.4 pg (ref 26.6–33.0)
MCHC: 32.8 g/dL (ref 31.5–35.7)
MCV: 90 fL (ref 79–97)
Monocytes Absolute: 0.4 x10E3/uL (ref 0.1–0.9)
Monocytes: 6 %
Neutrophils Absolute: 4.4 x10E3/uL (ref 1.4–7.0)
Neutrophils: 65 %
Platelets: 551 x10E3/uL — ABNORMAL HIGH (ref 150–450)
RBC: 4.72 x10E6/uL (ref 3.77–5.28)
RDW: 13.9 % (ref 11.7–15.4)
WBC: 6.6 x10E3/uL (ref 3.4–10.8)

## 2024-04-02 LAB — COMPREHENSIVE METABOLIC PANEL WITH GFR
ALT: 10 IU/L (ref 0–32)
AST: 16 IU/L (ref 0–40)
Albumin: 4.5 g/dL (ref 3.8–4.9)
Alkaline Phosphatase: 106 IU/L (ref 49–135)
BUN/Creatinine Ratio: 11 (ref 9–23)
BUN: 11 mg/dL (ref 6–24)
Bilirubin Total: <0.2 mg/dL (ref 0.0–1.2)
CO2: 23 mmol/L (ref 20–29)
Calcium: 9.7 mg/dL (ref 8.7–10.2)
Chloride: 103 mmol/L (ref 96–106)
Creatinine, Ser: 0.96 mg/dL (ref 0.57–1.00)
Globulin, Total: 2.5 g/dL (ref 1.5–4.5)
Glucose: 97 mg/dL (ref 70–99)
Potassium: 3.5 mmol/L (ref 3.5–5.2)
Sodium: 143 mmol/L (ref 134–144)
Total Protein: 7 g/dL (ref 6.0–8.5)
eGFR: 68 mL/min/1.73 (ref >59)

## 2024-04-02 LAB — PHOSPHORUS: Phosphorus: 3.3 mg/dL (ref 3.0–4.3)

## 2024-04-02 NOTE — Progress Notes (Signed)
 Contacted via MyChart  Good morning Tracey Perez, your labs have returned and are looking a bit better on this check. Platelet count a little elevated, ensure to take your Eliquis  daily. Any questions? Keep being amazing!!  Thank you for allowing me to participate in your care.  I appreciate you. Kindest regards, Tykee Heideman

## 2024-04-03 NOTE — Patient Instructions (Signed)
 Be Involved in Caring For Your Health:  Taking Medications When medications are taken as directed, they can greatly improve your health. But if they are not taken as prescribed, they may not work. In some cases, not taking them correctly can be harmful. To help ensure your treatment remains effective and safe, understand your medications and how to take them. Bring your medications to each visit for review by your provider.  Your lab results, notes, and after visit summary will be available on My Chart. We strongly encourage you to use this feature. If lab results are abnormal the clinic will contact you with the appropriate steps. If the clinic does not contact you assume the results are satisfactory. You can always view your results on My Chart. If you have questions regarding your health or results, please contact the clinic during office hours. You can also ask questions on My Chart.  We at [facility] are grateful that you chose [place] to provide your care. We strive to provide evidence-based and compassionate care and are always looking for feedback. If you get a survey from the clinic please complete this so we can hear your opinions.  Community-Acquired Pneumonia, Adult Pneumonia is an infection of the lungs. It causes irritation and swelling in the airways of the lungs. Mucus and fluid may also build up inside the airways. This may cause coughing and trouble breathing. One type of pneumonia can happen while you are in a hospital. A different type can happen when you are not in a hospital (community-acquired pneumonia). What are the causes?  This condition is caused by germs (viruses, bacteria, or fungi). Some types of germs can spread from person to person. Pneumonia is not thought to spread from person to person. What increases the risk? You have a long-term (chronic) disease, such as: Disease of the lungs. This may be chronic obstructive pulmonary disease (COPD) or asthma. Heart  failure. Cystic fibrosis. Diabetes. Kidney disease. Sickle cell disease. HIV. You have other health problems, such as: Your body's defense system (immune system) is weak. A condition that may cause you to breathe in fluids from your mouth and nose. You had your spleen taken out. You do not take good care of your teeth and mouth (poor dental hygiene). You use or have used tobacco products. You go where the germs that cause this illness are common. You are older than 59 years of age. What are the signs or symptoms? A cough. A fever. Sweating or chills. Chest pain, often when you breathe deeply or cough. Breathing problems, such as: Fast breathing. Trouble breathing. Shortness of breath. Feeling tired (fatigued). Muscle aches. How is this treated? Treatment for this condition depends on many things, such as: The cause of your illness. Your medicines. Your other health problems. Most adults can be treated at home. Sometimes, treatment must happen in a hospital. Treatment may include medicines to kill germs. Medicines may depend on which germ caused your illness. Very bad pneumonia is rare. If you get it, you may: Have a machine to help you breathe. Have fluid taken away from around your lungs. Follow these instructions at home: Medicines Take over-the-counter and prescription medicines only as told by your doctor. Take cough medicine only if you are losing sleep. Cough medicine can keep your body from taking mucus away from your lungs. If you were prescribed antibiotics, take them as told by your doctor. Do not stop taking them even if you start to feel better. Lifestyle  Do not smoke or use any products that contain nicotine or tobacco. If you need help quitting, ask your doctor. Do not drink alcohol. Eat a healthy diet. This includes a lot of vegetables, fruits, whole grains, low-fat dairy products, and low-fat (lean) protein. General instructions  Rest a lot. Sleep  for at least 8 hours each night. Sleep with your head and neck raised. Put a few pillows under your head or sleep in a reclining chair. Return to your normal activities as told by your doctor. Ask your doctor what activities are safe for you. Drink enough fluid to keep your pee (urine) pale yellow. If your throat is sore, gargle with a mixture of salt and water 3-4 times a day or as needed. To make salt water, completely dissolve -1 tsp (3-6 g) of salt in 1 cup (237 mL) of warm water. Keep all follow-up visits. How is this prevented? Getting the pneumonia shot (vaccine). These shots have different types and schedules. Ask your doctor what works best for you. Think about getting this shot if: You are older than 59 years of age. You are 25-63 years of age and: You are being treated for cancer. You have long-term lung disease. You have other problems that affect your body's defense system. Ask your doctor if you have one of these. Getting your flu shot every year. Ask your doctor which type of shot is best for you. Going to the dentist as often as told. Washing your hands often with soap and water for at least 20 seconds. If you cannot use soap and water, use hand sanitizer. Contact a doctor if: You have a fever. You lose sleep because your cough medicine does not help. Get help right away if: You are short of breath and this gets worse. You have more chest pain. Your sickness gets worse. This is very serious if: You are an older adult. Your body's defense system is weak. You cough up blood. These symptoms may be an emergency. Get help right away. Call 911. Do not wait to see if the symptoms will go away. Do not drive yourself to the hospital. Summary Pneumonia is an infection of the lungs. Community-acquired pneumonia affects people who have not been in the hospital. Certain germs can cause this infection. This condition may be treated with medicines that kill germs. For very bad  pneumonia, you may need a hospital stay and treatment to help with breathing. This information is not intended to replace advice given to you by your health care provider. Make sure you discuss any questions you have with your health care provider. Document Revised: 06/05/2021 Document Reviewed: 06/05/2021 [employer]vier Patient Education  2024 [employer].

## 2024-04-06 ENCOUNTER — Ambulatory Visit: Admitting: Nurse Practitioner

## 2024-04-06 ENCOUNTER — Encounter: Payer: Self-pay | Admitting: Nurse Practitioner

## 2024-04-06 VITALS — BP 122/84 | HR 97 | Temp 98.2°F | Resp 15 | Ht 63.19 in | Wt 119.4 lb

## 2024-04-06 DIAGNOSIS — J189 Pneumonia, unspecified organism: Secondary | ICD-10-CM

## 2024-04-06 MED ORDER — ALBUTEROL SULFATE HFA 108 (90 BASE) MCG/ACT IN AERS: 2.0000 | INHALATION_SPRAY | Freq: Four times a day (QID) | RESPIRATORY_TRACT | 2 refills | Status: AC | PRN

## 2024-04-06 NOTE — Assessment & Plan Note (Addendum)
 Acute, diagnosed on 03/27/24 - is improving. Had similar in September 2025. Will plan on repeat CXR in 6 weeks, around January 18th -- she will obtain week prior to next visit. Continue Tessalon  as needed. Recommend OTC Mucinex  and continue Albuterol  inhaler as needed. Recommend transition off Benadryl  slowly and try changing to Xyzal or Zyrtec daily -- does not tolerate nasal sprays. If any worsening symptoms to alert PCP ASAP or go immediately to ER.  She was given strict ER precautions and is aware of when to return. She will continue to monitor O2 sats at home regularly.

## 2024-04-06 NOTE — Progress Notes (Signed)
 BP 122/84 (BP Location: Left Arm, Patient Position: Sitting, Cuff Size: Normal)   Pulse 97   Temp 98.2 F (36.8 C) (Oral)   Resp 15   Ht 5' 3.19 (1.605 m)   Wt 119 lb 6.4 oz (54.2 kg)   SpO2 97%   BMI 21.02 kg/m    Subjective:    Patient ID: Tracey Perez, female    DOB: 1965-07-12, 58 y.o.   MRN: [id number]  HPI: Tracey Perez is a 57 y.o. female  Chief Complaint  Patient presents with   Pneumonia    Feeling about 75% better. Coughing much less.    PNEUMONIA Follow-up for pneumonia today. Seen in ER on 03/27/24 and diagnosed with pneumonia. Was hospitalized on 01/16/24 for similar and had improved, gone back to work. Recent labs reassuring.  Imaging noting new patchy bilateral opacitie, greatest in right upper and left mid lobe. Respiratory panel was negative. Finished Augmentin  and Zpack just prior to last visit. We extended Augmentin  last visit and sent in 5 days of Prednisone . She is feeling about 75% better, still a little cough. She feels steroid helped a lot. Using Albuterol  and Tessalon  as needed. 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 Interim medical history since our last visit reviewed. Allergies and medications reviewed and updated.  Review of Systems  Constitutional:  Positive for fatigue.  Negative for activity change, appetite change, chills and fever.  HENT:  Positive for congestion, postnasal drip and rhinorrhea.   Respiratory:  Positive for cough. Negative for chest tightness, shortness of breath and wheezing.   Cardiovascular:  Negative for chest pain, palpitations and leg swelling.  Gastrointestinal: Negative.   Neurological: Negative.   Psychiatric/Behavioral: Negative.     Per HPI unless specifically indicated above     Objective:    BP 122/84 (BP Location: Left Arm, Patient Position: Sitting, Cuff Size: Normal)   Pulse 97   Temp 98.2 F (36.8 C) (Oral)   Resp 15   Ht 5' 3.19 (1.605 m)   Wt 119 lb 6.4 oz (54.2 kg)   SpO2 97%   BMI 21.02 kg/m   Wt Readings from Last 3 Encounters:  04/06/24 119 lb 6.4 oz (54.2 kg)  04/01/24 124 lb 3.2 oz (56.3 kg)  03/27/24 120 lb (54.4 kg)    Physical Exam Vitals and nursing note reviewed.  Constitutional:      General: She is awake. She is not in acute distress.    Appearance: She is well-developed and well-groomed. She is ill-appearing. She is not toxic-appearing.  HENT:     Head: Normocephalic.     Right Ear: Hearing, tympanic membrane, ear canal and external ear normal.     Left Ear: Hearing, tympanic membrane, ear canal and external ear normal.     Nose: No rhinorrhea.     Right Sinus: No maxillary sinus tenderness or  frontal sinus tenderness.     Left Sinus: No maxillary sinus tenderness or frontal sinus tenderness.     Mouth/Throat:     Mouth: Mucous membranes are moist.     Pharynx: Posterior oropharyngeal erythema (mild) present. No pharyngeal swelling or oropharyngeal exudate.  Eyes:     General: Lids are normal.        Right eye: No discharge.        Left eye: No discharge.     Conjunctiva/sclera: Conjunctivae normal.     Pupils: Pupils are equal, round, and reactive to light.  Neck:     Thyroid : No thyromegaly.     Vascular: No carotid bruit.  Cardiovascular:     Rate and Rhythm: Normal rate and  regular rhythm.     Heart sounds: Normal heart sounds. No murmur heard.    No gallop.  Pulmonary:     Effort: Pulmonary effort is normal. No accessory muscle usage or respiratory distress.     Breath sounds: No decreased breath sounds, wheezing or rales.     Comments: Lung sounds clear throughout and minimal coughing today. Abdominal:     General: Bowel sounds are normal.     Palpations: Abdomen is soft. There is no hepatomegaly or splenomegaly.  Musculoskeletal:     Cervical back: Normal range of motion and neck supple.     Right lower leg: No edema.     Left lower leg: No edema.  Lymphadenopathy:     Head:     Right side of head: No submental, submandibular, tonsillar, preauricular or posterior auricular adenopathy.     Left side of head: No submental, submandibular, tonsillar, preauricular or posterior auricular adenopathy.     Cervical: No cervical adenopathy.  Skin:    General: Skin is warm and dry.  Neurological:     Mental Status: She is alert and oriented to person, place, and time.  Psychiatric:        Attention and Perception: Attention normal.        Mood and Affect: Mood normal.        Speech: Speech normal.        Behavior: Behavior normal. Behavior is cooperative.        Thought Content: Thought content normal.     Results for orders placed or performed in visit on 04/01/24  CBC with Differential/Platelet   Collection Time: 04/01/24  4:45 PM  Result Value Ref Range   WBC 6.6 3.4 - 10.8 x10E3/uL   RBC 4.72 3.77 - 5.28 x10E6/uL   Hemoglobin 13.9 11.1 - 15.9 g/dL   Hematocrit 57.5 65.9 - 46.6 %   MCV 90 79 - 97 fL   MCH 29.4 26.6 - 33.0 pg   MCHC 32.8 31.5 - 35.7 g/dL   RDW 86.0 88.2 - 84.5 %   Platelets 551 (H) 150 - 450 x10E3/uL   Neutrophils 65 Not Estab. %   Lymphs 24 Not Estab. %   Monocytes 6 Not Estab. %   Eos 3 Not Estab. %   Basos 1 Not Estab. %   Neutrophils Absolute 4.4 1.4 - 7.0 x10E3/uL   Lymphocytes Absolute 1.6 0.7 - 3.1 x10E3/uL    Monocytes Absolute 0.4 0.1 - 0.9 x10E3/uL   EOS (ABSOLUTE) 0.2 0.0 - 0.4 x10E3/uL   Basophils Absolute 0.0 0.0 - 0.2 x10E3/uL   Immature Granulocytes 0 Not Estab. %   Immature Grans (Abs) 0.0 0.0 - 0.1 x10E3/uL  Comprehensive metabolic panel with GFR   Collection  Time: 04/01/24  4:45 PM  Result Value Ref Range   Glucose 97 70 - 99 mg/dL   BUN 11 6 - 24 mg/dL   Creatinine, Ser 9.03 0.57 - 1.00 mg/dL   eGFR 68 >40 fO/fpw/8.26   BUN/Creatinine Ratio 11 9 - 23   Sodium 143 134 - 144 mmol/L   Potassium 3.5 3.5 - 5.2 mmol/L   Chloride 103 96 - 106 mmol/L   CO2 23 20 - 29 mmol/L   Calcium 9.7 8.7 - 10.2 mg/dL   Total Protein 7.0 6.0 - 8.5 g/dL   Albumin 4.5 3.8 - 4.9 g/dL   Globulin, Total 2.5 1.5 - 4.5 g/dL   Bilirubin Total <9.7 0.0 - 1.2 mg/dL   Alkaline Phosphatase 106 49 - 135 IU/L   AST 16 0 - 40 IU/L   ALT 10 0 - 32 IU/L  Phosphorus   Collection Time: 04/01/24  4:45 PM  Result Value Ref Range   Phosphorus 3.3 3.0 - 4.3 mg/dL      Assessment & Plan:   Problem List Items Addressed This Visit       Respiratory   CAP (community acquired pneumonia) - Primary   Acute, diagnosed on 03/27/24 - is improving. Had similar in September 2025. Will plan on repeat CXR in 6 weeks, around January 18th -- she will obtain week prior to next visit. Continue Tessalon  as needed. Recommend OTC Mucinex  and continue Albuterol  inhaler as needed. Recommend transition off Benadryl  slowly and try changing to Xyzal or Zyrtec daily -- does not tolerate nasal sprays. If any worsening symptoms to alert PCP ASAP or go immediately to ER.  She was given strict ER precautions and is aware of when to return. She will continue to monitor O2 sats at home regularly.      Relevant Medications   albuterol  (VENTOLIN  HFA) 108 (90 Base) MCG/ACT inhaler   Other Relevant Orders   DG Chest 2 View     Follow up plan: Return in about 1 month (around 05/09/2024) for Pneumonia.

## 2024-04-12 ENCOUNTER — Ambulatory Visit: Admitting: Nurse Practitioner

## 2024-05-07 NOTE — Patient Instructions (Signed)
 Be Involved in Caring For Your Health:  Taking Medications When medications are taken as directed, they can greatly improve your health. But if they are not taken as prescribed, they may not work. In some cases, not taking them correctly can be harmful. To help ensure your treatment remains effective and safe, understand your medications and how to take them. Bring your medications to each visit for review by your provider.  Your lab results, notes, and after visit summary will be available on My Chart. We strongly encourage you to use this feature. If lab results are abnormal the clinic will contact you with the appropriate steps. If the clinic does not contact you assume the results are satisfactory. You can always view your results on My Chart. If you have questions regarding your health or results, please contact the clinic during office hours. You can also ask questions on My Chart.  We at [facility] are grateful that you chose [place] to provide your care. We strive to provide evidence-based and compassionate care and are always looking for feedback. If you get a survey from the clinic please complete this so we can hear your opinions.  Community-Acquired Pneumonia, Adult Pneumonia is an infection of the lungs. It causes irritation and swelling in the airways of the lungs. Mucus and fluid may also build up inside the airways. This may cause coughing and trouble breathing. One type of pneumonia can happen while you are in a hospital. A different type can happen when you are not in a hospital (community-acquired pneumonia). What are the causes?  This condition is caused by germs (viruses, bacteria, or fungi). Some types of germs can spread from person to person. Pneumonia is not thought to spread from person to person. What increases the risk? You have a long-term (chronic) disease, such as: Disease of the lungs. This may be chronic obstructive pulmonary disease (COPD) or asthma. Heart  failure. Cystic fibrosis. Diabetes. Kidney disease. Sickle cell disease. HIV. You have other health problems, such as: Your body's defense system (immune system) is weak. A condition that may cause you to breathe in fluids from your mouth and nose. You had your spleen taken out. You do not take good care of your teeth and mouth (poor dental hygiene). You use or have used tobacco products. You go where the germs that cause this illness are common. You are older than 60 years of age. What are the signs or symptoms? A cough. A fever. Sweating or chills. Chest pain, often when you breathe deeply or cough. Breathing problems, such as: Fast breathing. Trouble breathing. Shortness of breath. Feeling tired (fatigued). Muscle aches. How is this treated? Treatment for this condition depends on many things, such as: The cause of your illness. Your medicines. Your other health problems. Most adults can be treated at home. Sometimes, treatment must happen in a hospital. Treatment may include medicines to kill germs. Medicines may depend on which germ caused your illness. Very bad pneumonia is rare. If you get it, you may: Have a machine to help you breathe. Have fluid taken away from around your lungs. Follow these instructions at home: Medicines Take over-the-counter and prescription medicines only as told by your doctor. Take cough medicine only if you are losing sleep. Cough medicine can keep your body from taking mucus away from your lungs. If you were prescribed antibiotics, take them as told by your doctor. Do not stop taking them even if you start to feel better. Lifestyle  Do not smoke or use any products that contain nicotine or tobacco. If you need help quitting, ask your doctor. Do not drink alcohol. Eat a healthy diet. This includes a lot of vegetables, fruits, whole grains, low-fat dairy products, and low-fat (lean) protein. General instructions  Rest a lot. Sleep  for at least 8 hours each night. Sleep with your head and neck raised. Put a few pillows under your head or sleep in a reclining chair. Return to your normal activities as told by your doctor. Ask your doctor what activities are safe for you. Drink enough fluid to keep your pee (urine) pale yellow. If your throat is sore, gargle with a mixture of salt and water 3-4 times a day or as needed. To make salt water, completely dissolve -1 tsp (3-6 g) of salt in 1 cup (237 mL) of warm water. Keep all follow-up visits. How is this prevented? Getting the pneumonia shot (vaccine). These shots have different types and schedules. Ask your doctor what works best for you. Think about getting this shot if: You are older than 60 years of age. You are 25-63 years of age and: You are being treated for cancer. You have long-term lung disease. You have other problems that affect your body's defense system. Ask your doctor if you have one of these. Getting your flu shot every year. Ask your doctor which type of shot is best for you. Going to the dentist as often as told. Washing your hands often with soap and water for at least 20 seconds. If you cannot use soap and water, use hand sanitizer. Contact a doctor if: You have a fever. You lose sleep because your cough medicine does not help. Get help right away if: You are short of breath and this gets worse. You have more chest pain. Your sickness gets worse. This is very serious if: You are an older adult. Your body's defense system is weak. You cough up blood. These symptoms may be an emergency. Get help right away. Call 911. Do not wait to see if the symptoms will go away. Do not drive yourself to the hospital. Summary Pneumonia is an infection of the lungs. Community-acquired pneumonia affects people who have not been in the hospital. Certain germs can cause this infection. This condition may be treated with medicines that kill germs. For very bad  pneumonia, you may need a hospital stay and treatment to help with breathing. This information is not intended to replace advice given to you by your health care provider. Make sure you discuss any questions you have with your health care provider. Document Revised: 06/05/2021 Document Reviewed: 06/05/2021 Elsevier Patient Education  2024 [employer].

## 2024-05-11 ENCOUNTER — Ambulatory Visit: Admitting: Nurse Practitioner

## 2024-05-11 VITALS — BP 133/84 | HR 82 | Temp 98.8°F | Ht 63.0 in | Wt 132.6 lb

## 2024-05-11 DIAGNOSIS — J189 Pneumonia, unspecified organism: Secondary | ICD-10-CM | POA: Diagnosis not present

## 2024-05-11 DIAGNOSIS — E890 Postprocedural hypothyroidism: Secondary | ICD-10-CM

## 2024-05-11 NOTE — Assessment & Plan Note (Signed)
 Chronic, stable with history of right lobectomy 08/07/2019.  Currently having some symptoms of being more hypo, recheck labs today and adjust dosing as needed.

## 2024-05-11 NOTE — Progress Notes (Signed)
 "  BP 133/84   Pulse 82   Temp 98.8 F (37.1 C) (Oral)   Ht 5' 3 (1.6 m)   Wt 132 lb 9.6 oz (60.1 kg)   SpO2 98%   BMI 23.49 kg/m    Subjective:    Patient ID: Tracey Perez, female    DOB: 05/31/1965, 59 y.o.   MRN: [id number]  HPI: Tracey Perez is a 59 y.o. female  Chief Complaint  Patient presents with   Pneumonia    1 month f-/yp   PNEUMONIA Follow-up today for pneumonia, treated on 03/27/24. Had previous PNA on 01/16/24. Has not gone for repeat chest x-ray. Overall feeling pretty good. Fever: no Cough: no Shortness of breath: no Wheezing: no Chest pain: no Chest tightness: no Chest congestion: no Nasal congestion: no Runny nose: yes Post nasal drip: yes Sneezing: no Sore throat: no Swollen glands: no Sinus pressure: no Headache: no Face pain: no Toothache: no Ear pain: none Ear pressure: none Eyes red/itching:no Eye drainage/crusting: no  Vomiting: no Rash: no Fatigue: at baseline, but none from sickness Sick contacts: no Strep contacts: no  Context: better Recurrent sinusitis: no Relief with OTC cold/cough medications: yes  Treatments attempted: antibiotics and steroids    HYPOTHYROIDISM Taking 50 MCG Levothyroxine  Thyroid  control status:stable Satisfied with current treatment? yes Medication side effects: no Medication compliance: good compliance Etiology of hypothyroidism: postsurgical Recent dose adjustment:no Fatigue: yes Cold intolerance: yes Heat intolerance: no Weight gain: yes Weight loss: no Constipation: no Diarrhea/loose stools: yes Palpitations: no Lower extremity edema: no Anxiety/depressed mood: no      05/11/2024    9:14 AM 04/06/2024    1:11 PM 02/15/2024    2:23 PM 02/01/2024    9:49 AM 10/12/2023   11:16 AM  Depression screen PHQ 2/9  Decreased Interest 2 2 1 1 2   Down, Depressed, Hopeless 2 2 1 1 2   PHQ - 2 Score 4 4 2 2 4   Altered sleeping 1 2 1 1 2   Tired, decreased energy 2 2 1 2 2   Change in appetite  2 2 2 2 3   Feeling bad or failure about yourself  3 2 1 1 1   Trouble concentrating 2 1 1 1  0  Moving slowly or fidgety/restless 1 1 1  0 0  Suicidal thoughts 2 2 1  0 0  PHQ-9 Score 17 16 10  9  12    Difficult doing work/chores Somewhat difficult   Somewhat difficult Somewhat difficult     Data saved with a previous flowsheet row definition       05/11/2024    9:14 AM 04/06/2024    1:11 PM 02/15/2024    2:24 PM 02/01/2024    9:49 AM  GAD 7 : Generalized Anxiety Score  Nervous, Anxious, on Edge 2 2  1  1    Control/stop worrying 2 2  2   0   Worry too much - different things 2 2  1   0   Trouble relaxing 2 2  2  1    Restless 2 2  1   0   Easily annoyed or irritable 2 2  2  2    Afraid - awful might happen 2 1  1   0   Total GAD 7 Score 14 13 10 4   Anxiety Difficulty Somewhat difficult   Not difficult at all     Data saved with a previous flowsheet row definition   Relevant past medical, surgical, family and social history reviewed and updated as  indicated. Interim medical history since our last visit reviewed. Allergies and medications reviewed and updated.  Review of Systems  Constitutional:  Positive for fatigue and unexpected weight change. Negative for activity change, appetite change, diaphoresis and fever.  Respiratory:  Negative for cough, chest tightness, shortness of breath and wheezing.   Cardiovascular:  Negative for chest pain, palpitations and leg swelling.  Gastrointestinal:  Positive for diarrhea.  Endocrine: Positive for cold intolerance. Negative for heat intolerance.  Neurological: Negative.   Psychiatric/Behavioral: Negative.     Per HPI unless specifically indicated above     Objective:    BP 133/84   Pulse 82   Temp 98.8 F (37.1 C) (Oral)   Ht 5' 3 (1.6 m)   Wt 132 lb 9.6 oz (60.1 kg)   SpO2 98%   BMI 23.49 kg/m   Wt Readings from Last 3 Encounters:  05/11/24 132 lb 9.6 oz (60.1 kg)  04/06/24 119 lb 6.4 oz (54.2 kg)  04/01/24 124 lb 3.2 oz (56.3  kg)    Physical Exam Vitals and nursing note reviewed.  Constitutional:      General: She is awake. She is not in acute distress.    Appearance: She is well-developed and well-groomed. She is not ill-appearing or toxic-appearing.  HENT:     Head: Normocephalic.     Right Ear: Hearing and external ear normal.     Left Ear: Hearing and external ear normal.  Eyes:     General: Lids are normal.        Right eye: No discharge.        Left eye: No discharge.     Conjunctiva/sclera: Conjunctivae normal.     Pupils: Pupils are equal, round, and reactive to light.  Neck:     Thyroid : No thyromegaly.     Vascular: No carotid bruit.  Cardiovascular:     Rate and Rhythm: Normal rate and regular rhythm.     Heart sounds: Normal heart sounds. No murmur heard.    No gallop.  Pulmonary:     Effort: Pulmonary effort is normal. No accessory muscle usage or respiratory distress.     Breath sounds: Normal breath sounds. No decreased breath sounds, wheezing or rales.  Abdominal:     General: Bowel sounds are normal. There is no distension.     Palpations: Abdomen is soft.     Tenderness: There is no abdominal tenderness.  Musculoskeletal:     Cervical back: Normal range of motion and neck supple.     Right lower leg: No edema.     Left lower leg: No edema.  Lymphadenopathy:     Cervical: No cervical adenopathy.  Skin:    General: Skin is warm and dry.  Neurological:     Mental Status: She is alert and oriented to person, place, and time.     Deep Tendon Reflexes: Reflexes are normal and symmetric.     Reflex Scores:      Brachioradialis reflexes are 2+ on the right side and 2+ on the left side.      Patellar reflexes are 2+ on the right side and 2+ on the left side. Psychiatric:        Attention and Perception: Attention normal.        Mood and Affect: Mood normal.        Speech: Speech normal.        Behavior: Behavior normal. Behavior is cooperative.        Thought Content:  Thought  content normal.    Results for orders placed or performed in visit on 04/01/24  CBC with Differential/Platelet   Collection Time: 04/01/24  4:45 PM  Result Value Ref Range   WBC 6.6 3.4 - 10.8 x10E3/uL   RBC 4.72 3.77 - 5.28 x10E6/uL   Hemoglobin 13.9 11.1 - 15.9 g/dL   Hematocrit 57.5 65.9 - 46.6 %   MCV 90 79 - 97 fL   MCH 29.4 26.6 - 33.0 pg   MCHC 32.8 31.5 - 35.7 g/dL   RDW 86.0 88.2 - 84.5 %   Platelets 551 (H) 150 - 450 x10E3/uL   Neutrophils 65 Not Estab. %   Lymphs 24 Not Estab. %   Monocytes 6 Not Estab. %   Eos 3 Not Estab. %   Basos 1 Not Estab. %   Neutrophils Absolute 4.4 1.4 - 7.0 x10E3/uL   Lymphocytes Absolute 1.6 0.7 - 3.1 x10E3/uL   Monocytes Absolute 0.4 0.1 - 0.9 x10E3/uL   EOS (ABSOLUTE) 0.2 0.0 - 0.4 x10E3/uL   Basophils Absolute 0.0 0.0 - 0.2 x10E3/uL   Immature Granulocytes 0 Not Estab. %   Immature Grans (Abs) 0.0 0.0 - 0.1 x10E3/uL  Comprehensive metabolic panel with GFR   Collection Time: 04/01/24  4:45 PM  Result Value Ref Range   Glucose 97 70 - 99 mg/dL   BUN 11 6 - 24 mg/dL   Creatinine, Ser 9.03 0.57 - 1.00 mg/dL   eGFR 68 >40 fO/fpw/8.26   BUN/Creatinine Ratio 11 9 - 23   Sodium 143 134 - 144 mmol/L   Potassium 3.5 3.5 - 5.2 mmol/L   Chloride 103 96 - 106 mmol/L   CO2 23 20 - 29 mmol/L   Calcium 9.7 8.7 - 10.2 mg/dL   Total Protein 7.0 6.0 - 8.5 g/dL   Albumin 4.5 3.8 - 4.9 g/dL   Globulin, Total 2.5 1.5 - 4.5 g/dL   Bilirubin Total <9.7 0.0 - 1.2 mg/dL   Alkaline Phosphatase 106 49 - 135 IU/L   AST 16 0 - 40 IU/L   ALT 10 0 - 32 IU/L  Phosphorus   Collection Time: 04/01/24  4:45 PM  Result Value Ref Range   Phosphorus 3.3 3.0 - 4.3 mg/dL      Assessment & Plan:   Problem List Items Addressed This Visit       Respiratory   CAP (community acquired pneumonia) - Primary   Acute and improving at this time. Recommend she attend visit for repeat chest imaging, order is in place. If any return of symptoms then she will return to  office.      Relevant Orders   CBC with Differential/Platelet     Endocrine   Postsurgical hypothyroidism   Chronic, stable with history of right lobectomy 08/07/2019.  Currently having some symptoms of being more hypo, recheck labs today and adjust dosing as needed.      Relevant Orders   T4, free   TSH     Follow up plan: Return in about 3 months (around 08/09/2024) for Annual Physical.      "

## 2024-05-11 NOTE — Assessment & Plan Note (Signed)
 Acute and improving at this time. Recommend she attend visit for repeat chest imaging, order is in place. If any return of symptoms then she will return to office.

## 2024-05-12 ENCOUNTER — Ambulatory Visit: Payer: Self-pay | Admitting: Nurse Practitioner

## 2024-05-12 LAB — CBC WITH DIFFERENTIAL/PLATELET
Basophils Absolute: 0 x10E3/uL (ref 0.0–0.2)
Basos: 0 %
EOS (ABSOLUTE): 0.2 x10E3/uL (ref 0.0–0.4)
Eos: 5 %
Hematocrit: 37.8 % (ref 34.0–46.6)
Hemoglobin: 11.9 g/dL (ref 11.1–15.9)
Immature Grans (Abs): 0 x10E3/uL (ref 0.0–0.1)
Immature Granulocytes: 0 %
Lymphocytes Absolute: 1.4 x10E3/uL (ref 0.7–3.1)
Lymphs: 39 %
MCH: 29.5 pg (ref 26.6–33.0)
MCHC: 31.5 g/dL (ref 31.5–35.7)
MCV: 94 fL (ref 79–97)
Monocytes Absolute: 0.3 x10E3/uL (ref 0.1–0.9)
Monocytes: 7 %
Neutrophils Absolute: 1.8 x10E3/uL (ref 1.4–7.0)
Neutrophils: 49 %
Platelets: 244 x10E3/uL (ref 150–450)
RBC: 4.04 x10E6/uL (ref 3.77–5.28)
RDW: 14.3 % (ref 11.7–15.4)
WBC: 3.6 x10E3/uL (ref 3.4–10.8)

## 2024-05-12 LAB — T4, FREE: Free T4: 1.08 ng/dL (ref 0.82–1.77)

## 2024-05-12 LAB — TSH: TSH: 2.13 u[IU]/mL (ref 0.450–4.500)

## 2024-05-12 NOTE — Progress Notes (Signed)
 Contacted via MyChart  All labs are stable, including thyroid . I would continue current Levothyroxine  dosing. Any questions?

## 2024-08-17 ENCOUNTER — Encounter: Admitting: Nurse Practitioner
